# Patient Record
Sex: Male | Born: 1935 | Race: White | Hispanic: No | Marital: Married | Smoking: Never smoker
Health system: Southern US, Community
[De-identification: ages and names within clinical notes are randomized; demographics above are authoritative.]

## PROBLEM LIST (undated history)

## (undated) DIAGNOSIS — I87009 Postthrombotic syndrome without complications of unspecified extremity: Secondary | ICD-10-CM

## (undated) DIAGNOSIS — K219 Gastro-esophageal reflux disease without esophagitis: Secondary | ICD-10-CM

## (undated) DIAGNOSIS — E119 Type 2 diabetes mellitus without complications: Secondary | ICD-10-CM

## (undated) DIAGNOSIS — M1990 Unspecified osteoarthritis, unspecified site: Secondary | ICD-10-CM

## (undated) DIAGNOSIS — R251 Tremor, unspecified: Secondary | ICD-10-CM

## (undated) DIAGNOSIS — D45 Polycythemia vera: Secondary | ICD-10-CM

## (undated) DIAGNOSIS — I10 Essential (primary) hypertension: Secondary | ICD-10-CM

## (undated) DIAGNOSIS — I2510 Atherosclerotic heart disease of native coronary artery without angina pectoris: Secondary | ICD-10-CM

## (undated) DIAGNOSIS — J449 Chronic obstructive pulmonary disease, unspecified: Secondary | ICD-10-CM

## (undated) DIAGNOSIS — R413 Other amnesia: Secondary | ICD-10-CM

## (undated) DIAGNOSIS — G4733 Obstructive sleep apnea (adult) (pediatric): Secondary | ICD-10-CM

## (undated) DIAGNOSIS — R269 Unspecified abnormalities of gait and mobility: Secondary | ICD-10-CM

## (undated) DIAGNOSIS — I82409 Acute embolism and thrombosis of unspecified deep veins of unspecified lower extremity: Secondary | ICD-10-CM

## (undated) HISTORY — DX: Postthrombotic syndrome without complications of unspecified extremity: I87.009

## (undated) HISTORY — PX: TONSILLECTOMY: SUR1361

## (undated) HISTORY — DX: Polycythemia vera: D45

## (undated) HISTORY — PX: OTHER SURGICAL HISTORY: SHX169

## (undated) HISTORY — DX: Other amnesia: R41.3

## (undated) HISTORY — PX: BACK SURGERY: SHX140

## (undated) HISTORY — PX: CORONARY ARTERY BYPASS GRAFT: SHX141

## (undated) HISTORY — DX: Unspecified abnormalities of gait and mobility: R26.9

## (undated) HISTORY — DX: Type 2 diabetes mellitus without complications: E11.9

## (undated) HISTORY — DX: Acute embolism and thrombosis of unspecified deep veins of unspecified lower extremity: I82.409

## (undated) HISTORY — DX: Tremor, unspecified: R25.1

## (undated) HISTORY — DX: Gastro-esophageal reflux disease without esophagitis: K21.9

## (undated) HISTORY — DX: Obstructive sleep apnea (adult) (pediatric): G47.33

---

## 1998-01-17 ENCOUNTER — Encounter: Admission: RE | Admit: 1998-01-17 | Discharge: 1998-04-17 | Payer: Self-pay | Admitting: Internal Medicine

## 1998-02-14 ENCOUNTER — Encounter: Payer: Self-pay | Admitting: Hematology & Oncology

## 1998-02-14 ENCOUNTER — Ambulatory Visit (HOSPITAL_COMMUNITY): Admission: RE | Admit: 1998-02-14 | Discharge: 1998-02-14 | Payer: Self-pay | Admitting: Hematology & Oncology

## 1999-10-25 ENCOUNTER — Ambulatory Visit (HOSPITAL_COMMUNITY): Admission: RE | Admit: 1999-10-25 | Discharge: 1999-10-25 | Payer: Self-pay | Admitting: Ophthalmology

## 2002-06-15 ENCOUNTER — Encounter: Payer: Self-pay | Admitting: Ophthalmology

## 2002-06-17 ENCOUNTER — Ambulatory Visit (HOSPITAL_COMMUNITY): Admission: RE | Admit: 2002-06-17 | Discharge: 2002-06-17 | Payer: Self-pay | Admitting: Ophthalmology

## 2002-12-15 ENCOUNTER — Encounter: Payer: Self-pay | Admitting: Specialist

## 2002-12-15 ENCOUNTER — Encounter: Payer: Self-pay | Admitting: Emergency Medicine

## 2002-12-15 ENCOUNTER — Inpatient Hospital Stay (HOSPITAL_COMMUNITY): Admission: EM | Admit: 2002-12-15 | Discharge: 2002-12-21 | Payer: Self-pay | Admitting: Emergency Medicine

## 2003-12-20 ENCOUNTER — Encounter (HOSPITAL_BASED_OUTPATIENT_CLINIC_OR_DEPARTMENT_OTHER): Admission: RE | Admit: 2003-12-20 | Discharge: 2003-12-23 | Payer: Self-pay | Admitting: Internal Medicine

## 2004-02-08 ENCOUNTER — Ambulatory Visit: Payer: Self-pay | Admitting: Hematology & Oncology

## 2004-02-24 ENCOUNTER — Ambulatory Visit: Payer: Self-pay | Admitting: Critical Care Medicine

## 2004-02-26 ENCOUNTER — Ambulatory Visit (HOSPITAL_BASED_OUTPATIENT_CLINIC_OR_DEPARTMENT_OTHER): Admission: RE | Admit: 2004-02-26 | Discharge: 2004-02-26 | Payer: Self-pay | Admitting: Critical Care Medicine

## 2004-02-26 ENCOUNTER — Ambulatory Visit: Payer: Self-pay | Admitting: Pulmonary Disease

## 2004-03-13 ENCOUNTER — Ambulatory Visit: Payer: Self-pay | Admitting: Critical Care Medicine

## 2004-03-19 ENCOUNTER — Ambulatory Visit: Payer: Self-pay | Admitting: Adult Health

## 2004-03-21 ENCOUNTER — Encounter (HOSPITAL_BASED_OUTPATIENT_CLINIC_OR_DEPARTMENT_OTHER): Admission: RE | Admit: 2004-03-21 | Discharge: 2004-04-06 | Payer: Self-pay | Admitting: Internal Medicine

## 2004-04-06 ENCOUNTER — Ambulatory Visit: Payer: Self-pay | Admitting: Critical Care Medicine

## 2004-04-11 ENCOUNTER — Ambulatory Visit: Payer: Self-pay | Admitting: Hematology & Oncology

## 2004-05-10 ENCOUNTER — Ambulatory Visit: Payer: Self-pay | Admitting: Critical Care Medicine

## 2004-06-05 ENCOUNTER — Ambulatory Visit: Payer: Self-pay | Admitting: Hematology & Oncology

## 2004-08-07 ENCOUNTER — Ambulatory Visit: Payer: Self-pay | Admitting: Hematology & Oncology

## 2004-09-27 ENCOUNTER — Ambulatory Visit: Payer: Self-pay | Admitting: Critical Care Medicine

## 2004-10-08 ENCOUNTER — Ambulatory Visit: Payer: Self-pay | Admitting: Hematology & Oncology

## 2004-11-06 ENCOUNTER — Ambulatory Visit: Payer: Self-pay | Admitting: Internal Medicine

## 2004-12-04 ENCOUNTER — Ambulatory Visit: Payer: Self-pay | Admitting: Hematology & Oncology

## 2004-12-13 ENCOUNTER — Ambulatory Visit: Payer: Self-pay | Admitting: Critical Care Medicine

## 2005-01-07 ENCOUNTER — Encounter: Admission: RE | Admit: 2005-01-07 | Discharge: 2005-01-07 | Payer: Self-pay | Admitting: Internal Medicine

## 2005-01-21 ENCOUNTER — Ambulatory Visit: Payer: Self-pay | Admitting: Hematology & Oncology

## 2005-03-13 ENCOUNTER — Ambulatory Visit: Payer: Self-pay | Admitting: Critical Care Medicine

## 2005-03-26 ENCOUNTER — Ambulatory Visit: Payer: Self-pay | Admitting: Hematology & Oncology

## 2005-05-28 ENCOUNTER — Ambulatory Visit: Payer: Self-pay | Admitting: Hematology & Oncology

## 2005-06-24 ENCOUNTER — Ambulatory Visit: Payer: Self-pay | Admitting: Critical Care Medicine

## 2005-07-23 ENCOUNTER — Ambulatory Visit: Payer: Self-pay | Admitting: Hematology & Oncology

## 2005-07-24 LAB — CBC WITH DIFFERENTIAL/PLATELET
BASO%: 0.6 % (ref 0.0–2.0)
Basophils Absolute: 0 10*3/uL (ref 0.0–0.1)
Eosinophils Absolute: 0.1 10*3/uL (ref 0.0–0.5)
HCT: 46.3 % (ref 38.7–49.9)
HGB: 15.9 g/dL (ref 13.0–17.1)
MCHC: 34.4 g/dL (ref 32.0–35.9)
MONO#: 0.6 10*3/uL (ref 0.1–0.9)
NEUT#: 5.3 10*3/uL (ref 1.5–6.5)
NEUT%: 71.1 % (ref 40.0–75.0)
WBC: 7.4 10*3/uL (ref 4.0–10.0)
lymph#: 1.3 10*3/uL (ref 0.9–3.3)

## 2005-08-08 ENCOUNTER — Ambulatory Visit: Payer: Self-pay | Admitting: Pulmonary Disease

## 2005-08-22 LAB — CBC WITH DIFFERENTIAL/PLATELET
BASO%: 0.1 % (ref 0.0–2.0)
LYMPH%: 19.9 % (ref 14.0–48.0)
MCHC: 34.2 g/dL (ref 32.0–35.9)
MONO#: 0.5 10*3/uL (ref 0.1–0.9)
RBC: 4.96 10*6/uL (ref 4.20–5.71)
WBC: 6.4 10*3/uL (ref 4.0–10.0)
lymph#: 1.3 10*3/uL (ref 0.9–3.3)

## 2005-08-22 LAB — CHCC SMEAR

## 2005-10-30 ENCOUNTER — Ambulatory Visit: Payer: Self-pay | Admitting: Critical Care Medicine

## 2005-11-19 ENCOUNTER — Ambulatory Visit: Payer: Self-pay | Admitting: Hematology & Oncology

## 2005-11-21 LAB — CBC WITH DIFFERENTIAL/PLATELET
Basophils Absolute: 0.1 10*3/uL (ref 0.0–0.1)
EOS%: 1.9 % (ref 0.0–7.0)
HGB: 15.8 g/dL (ref 13.0–17.1)
MCH: 29.7 pg (ref 28.0–33.4)
MCV: 87.5 fL (ref 81.6–98.0)
MONO%: 9.2 % (ref 0.0–13.0)
NEUT%: 66.6 % (ref 40.0–75.0)
RDW: 14.4 % (ref 11.2–14.6)

## 2005-11-28 LAB — CBC WITH DIFFERENTIAL/PLATELET
BASO%: 1.1 % (ref 0.0–2.0)
LYMPH%: 14.6 % (ref 14.0–48.0)
MCH: 29.9 pg (ref 28.0–33.4)
MCHC: 34.2 g/dL (ref 32.0–35.9)
MCV: 87.5 fL (ref 81.6–98.0)
MONO%: 7.5 % (ref 0.0–13.0)
Platelets: 123 10*3/uL — ABNORMAL LOW (ref 145–400)
RBC: 4.88 10*6/uL (ref 4.20–5.71)

## 2006-02-18 ENCOUNTER — Ambulatory Visit: Payer: Self-pay | Admitting: Hematology & Oncology

## 2006-02-20 LAB — CBC WITH DIFFERENTIAL/PLATELET
Basophils Absolute: 0 10*3/uL (ref 0.0–0.1)
EOS%: 2 % (ref 0.0–7.0)
LYMPH%: 19.4 % (ref 14.0–48.0)
MCH: 29.5 pg (ref 28.0–33.4)
MCV: 87.6 fL (ref 81.6–98.0)
MONO%: 7.3 % (ref 0.0–13.0)
Platelets: 114 10*3/uL — ABNORMAL LOW (ref 145–400)
RBC: 5.03 10*6/uL (ref 4.20–5.71)
RDW: 14.2 % (ref 11.2–14.6)

## 2006-03-05 ENCOUNTER — Ambulatory Visit: Payer: Self-pay | Admitting: Critical Care Medicine

## 2006-04-15 ENCOUNTER — Ambulatory Visit: Payer: Self-pay | Admitting: Hematology & Oncology

## 2006-04-17 LAB — CBC WITH DIFFERENTIAL/PLATELET
Basophils Absolute: 0 10*3/uL (ref 0.0–0.1)
EOS%: 1.9 % (ref 0.0–7.0)
HCT: 44 % (ref 38.7–49.9)
HGB: 14.7 g/dL (ref 13.0–17.1)
MCH: 28.8 pg (ref 28.0–33.4)
NEUT%: 69.7 % (ref 40.0–75.0)
lymph#: 1.2 10*3/uL (ref 0.9–3.3)

## 2006-05-29 ENCOUNTER — Encounter: Admission: RE | Admit: 2006-05-29 | Discharge: 2006-07-31 | Payer: Self-pay | Admitting: Specialist

## 2006-06-16 ENCOUNTER — Ambulatory Visit: Payer: Self-pay | Admitting: Hematology & Oncology

## 2006-06-19 LAB — CBC WITH DIFFERENTIAL/PLATELET
Basophils Absolute: 0 10*3/uL (ref 0.0–0.1)
EOS%: 0.8 % (ref 0.0–7.0)
HCT: 43.8 % (ref 38.7–49.9)
HGB: 15.4 g/dL (ref 13.0–17.1)
LYMPH%: 14.6 % (ref 14.0–48.0)
MCH: 30.6 pg (ref 28.0–33.4)
MCV: 87.2 fL (ref 81.6–98.0)
MONO%: 6.4 % (ref 0.0–13.0)
NEUT%: 77.7 % — ABNORMAL HIGH (ref 40.0–75.0)
RDW: 14.6 % (ref 11.2–14.6)

## 2006-08-12 ENCOUNTER — Ambulatory Visit: Payer: Self-pay | Admitting: Hematology & Oncology

## 2006-08-14 LAB — FERRITIN: Ferritin: 41 ng/mL (ref 22–322)

## 2006-08-14 LAB — CBC WITH DIFFERENTIAL/PLATELET
EOS%: 2.2 % (ref 0.0–7.0)
MCH: 30.7 pg (ref 28.0–33.4)
MCHC: 35.3 g/dL (ref 32.0–35.9)
MCV: 86.9 fL (ref 81.6–98.0)
MONO%: 6.8 % (ref 0.0–13.0)
RBC: 5.17 10*6/uL (ref 4.20–5.71)
RDW: 14.1 % (ref 11.2–14.6)

## 2006-09-25 LAB — CBC WITH DIFFERENTIAL/PLATELET
Basophils Absolute: 0.1 10*3/uL (ref 0.0–0.1)
Eosinophils Absolute: 0.1 10*3/uL (ref 0.0–0.5)
HGB: 14.8 g/dL (ref 13.0–17.1)
MONO#: 0.6 10*3/uL (ref 0.1–0.9)
NEUT#: 4.3 10*3/uL (ref 1.5–6.5)
RDW: 13.8 % (ref 11.2–14.6)
lymph#: 1.2 10*3/uL (ref 0.9–3.3)

## 2006-11-10 ENCOUNTER — Ambulatory Visit: Payer: Self-pay | Admitting: Hematology & Oncology

## 2006-11-13 LAB — CBC WITH DIFFERENTIAL/PLATELET
Basophils Absolute: 0.1 10*3/uL (ref 0.0–0.1)
Eosinophils Absolute: 0.1 10*3/uL (ref 0.0–0.5)
HGB: 15.5 g/dL (ref 13.0–17.1)
LYMPH%: 18.7 % (ref 14.0–48.0)
MCV: 87.1 fL (ref 81.6–98.0)
MONO%: 5.6 % (ref 0.0–13.0)
NEUT#: 4.9 10*3/uL (ref 1.5–6.5)
Platelets: 122 10*3/uL — ABNORMAL LOW (ref 145–400)

## 2006-11-27 ENCOUNTER — Ambulatory Visit: Payer: Self-pay | Admitting: Critical Care Medicine

## 2007-01-12 ENCOUNTER — Ambulatory Visit: Payer: Self-pay | Admitting: Hematology & Oncology

## 2007-01-15 LAB — CBC WITH DIFFERENTIAL/PLATELET
BASO%: 0.3 % (ref 0.0–2.0)
Basophils Absolute: 0 10*3/uL (ref 0.0–0.1)
HCT: 43.7 % (ref 38.7–49.9)
HGB: 15.5 g/dL (ref 13.0–17.1)
LYMPH%: 19.2 % (ref 14.0–48.0)
MCH: 30.7 pg (ref 28.0–33.4)
MCHC: 35.5 g/dL (ref 32.0–35.9)
MONO#: 0.4 10*3/uL (ref 0.1–0.9)
NEUT%: 70.9 % (ref 40.0–75.0)
Platelets: 107 10*3/uL — ABNORMAL LOW (ref 145–400)
WBC: 5.7 10*3/uL (ref 4.0–10.0)

## 2007-03-17 ENCOUNTER — Ambulatory Visit: Payer: Self-pay | Admitting: Hematology & Oncology

## 2007-03-19 LAB — CBC WITH DIFFERENTIAL/PLATELET
BASO%: 0.2 % (ref 0.0–2.0)
Basophils Absolute: 0 10*3/uL (ref 0.0–0.1)
EOS%: 2 % (ref 0.0–7.0)
HCT: 43.7 % (ref 38.7–49.9)
LYMPH%: 22.2 % (ref 14.0–48.0)
MCH: 30.7 pg (ref 28.0–33.4)
MCHC: 34.8 g/dL (ref 32.0–35.9)
MCV: 88.2 fL (ref 81.6–98.0)
MONO%: 8.6 % (ref 0.0–13.0)
NEUT%: 67 % (ref 40.0–75.0)
lymph#: 1.4 10*3/uL (ref 0.9–3.3)

## 2007-05-18 ENCOUNTER — Ambulatory Visit: Payer: Self-pay | Admitting: Hematology & Oncology

## 2007-05-21 LAB — CBC WITH DIFFERENTIAL/PLATELET
Basophils Absolute: 0.1 10*3/uL (ref 0.0–0.1)
EOS%: 1.7 % (ref 0.0–7.0)
Eosinophils Absolute: 0.1 10*3/uL (ref 0.0–0.5)
HCT: 47.7 % (ref 38.7–49.9)
HGB: 15.3 g/dL (ref 13.0–17.1)
MCH: 28.3 pg (ref 28.0–33.4)
MCV: 87.9 fL (ref 81.6–98.0)
MONO%: 9 % (ref 0.0–13.0)
NEUT%: 72.4 % (ref 40.0–75.0)
Platelets: 122 10*3/uL — ABNORMAL LOW (ref 145–400)

## 2007-05-27 ENCOUNTER — Ambulatory Visit: Payer: Self-pay | Admitting: Internal Medicine

## 2007-05-27 ENCOUNTER — Telehealth (INDEPENDENT_AMBULATORY_CARE_PROVIDER_SITE_OTHER): Payer: Self-pay | Admitting: *Deleted

## 2007-05-27 DIAGNOSIS — G4733 Obstructive sleep apnea (adult) (pediatric): Secondary | ICD-10-CM | POA: Insufficient documentation

## 2007-05-27 DIAGNOSIS — J45909 Unspecified asthma, uncomplicated: Secondary | ICD-10-CM | POA: Insufficient documentation

## 2007-05-27 DIAGNOSIS — K219 Gastro-esophageal reflux disease without esophagitis: Secondary | ICD-10-CM | POA: Insufficient documentation

## 2007-05-27 DIAGNOSIS — E119 Type 2 diabetes mellitus without complications: Secondary | ICD-10-CM | POA: Insufficient documentation

## 2007-07-14 ENCOUNTER — Ambulatory Visit: Payer: Self-pay | Admitting: Hematology & Oncology

## 2007-07-16 LAB — CBC WITH DIFFERENTIAL/PLATELET
Basophils Absolute: 0 10*3/uL (ref 0.0–0.1)
Eosinophils Absolute: 0.1 10*3/uL (ref 0.0–0.5)
HCT: 42.2 % (ref 38.7–49.9)
HGB: 14.7 g/dL (ref 13.0–17.1)
LYMPH%: 19.1 % (ref 14.0–48.0)
MCHC: 34.9 g/dL (ref 32.0–35.9)
MONO#: 0.4 10*3/uL (ref 0.1–0.9)
NEUT#: 4.1 10*3/uL (ref 1.5–6.5)
NEUT%: 70.8 % (ref 40.0–75.0)
Platelets: 101 10*3/uL — ABNORMAL LOW (ref 145–400)
WBC: 5.8 10*3/uL (ref 4.0–10.0)

## 2007-08-28 ENCOUNTER — Ambulatory Visit: Payer: Self-pay | Admitting: Internal Medicine

## 2007-09-08 ENCOUNTER — Ambulatory Visit: Payer: Self-pay | Admitting: Hematology & Oncology

## 2007-09-10 LAB — CBC WITH DIFFERENTIAL/PLATELET
Eosinophils Absolute: 0 10*3/uL (ref 0.0–0.5)
HCT: 44.7 % (ref 38.7–49.9)
LYMPH%: 7.7 % — ABNORMAL LOW (ref 14.0–48.0)
MONO#: 0.3 10*3/uL (ref 0.1–0.9)
NEUT#: 6.6 10*3/uL — ABNORMAL HIGH (ref 1.5–6.5)
Platelets: 110 10*3/uL — ABNORMAL LOW (ref 145–400)
RBC: 5.09 10*6/uL (ref 4.20–5.71)
WBC: 7.4 10*3/uL (ref 4.0–10.0)

## 2007-09-11 ENCOUNTER — Ambulatory Visit: Payer: Self-pay | Admitting: Critical Care Medicine

## 2007-10-08 ENCOUNTER — Ambulatory Visit: Payer: Self-pay | Admitting: Critical Care Medicine

## 2007-11-10 ENCOUNTER — Ambulatory Visit: Payer: Self-pay | Admitting: Hematology & Oncology

## 2007-11-12 LAB — RETICULOCYTES (CHCC): Retic Ct Pct: 0.8 % (ref 0.4–3.1)

## 2007-11-12 LAB — CBC WITH DIFFERENTIAL (CANCER CENTER ONLY)
BASO%: 0.9 % (ref 0.0–2.0)
EOS%: 2.3 % (ref 0.0–7.0)
HCT: 47.2 % (ref 38.7–49.9)
LYMPH#: 1.3 10*3/uL (ref 0.9–3.3)
MCHC: 34.2 g/dL (ref 32.0–35.9)
NEUT#: 3.9 10*3/uL (ref 1.5–6.5)
NEUT%: 67.7 % (ref 40.0–80.0)
RDW: 12.7 % (ref 10.5–14.6)

## 2008-01-06 ENCOUNTER — Ambulatory Visit: Payer: Self-pay | Admitting: Hematology & Oncology

## 2008-01-07 LAB — CBC WITH DIFFERENTIAL (CANCER CENTER ONLY)
BASO%: 0.7 % (ref 0.0–2.0)
EOS%: 2.7 % (ref 0.0–7.0)
HGB: 14.6 g/dL (ref 13.0–17.1)
LYMPH%: 18.2 % (ref 14.0–48.0)
MCH: 29.4 pg (ref 28.0–33.4)
NEUT%: 72.3 % (ref 40.0–80.0)
RBC: 4.96 10*6/uL (ref 4.20–5.70)
WBC: 6.4 10*3/uL (ref 4.0–10.0)

## 2008-01-19 ENCOUNTER — Ambulatory Visit: Payer: Self-pay | Admitting: Critical Care Medicine

## 2008-03-09 ENCOUNTER — Ambulatory Visit: Payer: Self-pay | Admitting: Hematology & Oncology

## 2008-03-10 LAB — CBC WITH DIFFERENTIAL (CANCER CENTER ONLY)
BASO#: 0 10*3/uL (ref 0.0–0.2)
BASO%: 0.4 % (ref 0.0–2.0)
EOS%: 2.7 % (ref 0.0–7.0)
HGB: 15.5 g/dL (ref 13.0–17.1)
LYMPH#: 1.4 10*3/uL (ref 0.9–3.3)
MCH: 30 pg (ref 28.0–33.4)
MCHC: 34.5 g/dL (ref 32.0–35.9)
MONO%: 7.1 % (ref 0.0–13.0)
NEUT#: 3 10*3/uL (ref 1.5–6.5)
Platelets: 93 10*3/uL — ABNORMAL LOW (ref 145–400)

## 2008-05-10 ENCOUNTER — Ambulatory Visit: Payer: Self-pay | Admitting: Hematology & Oncology

## 2008-05-11 LAB — CBC WITH DIFFERENTIAL (CANCER CENTER ONLY)
Eosinophils Absolute: 0.1 10*3/uL (ref 0.0–0.5)
HCT: 42.6 % (ref 38.7–49.9)
LYMPH%: 25.3 % (ref 14.0–48.0)
MCH: 29.2 pg (ref 28.0–33.4)
MCV: 86 fL (ref 82–98)
MONO#: 0.2 10*3/uL (ref 0.1–0.9)
MONO%: 5 % (ref 0.0–13.0)
NEUT%: 66.9 % (ref 40.0–80.0)
RBC: 4.92 10*6/uL (ref 4.20–5.70)
WBC: 4.7 10*3/uL (ref 4.0–10.0)

## 2008-07-06 ENCOUNTER — Ambulatory Visit: Payer: Self-pay | Admitting: Hematology & Oncology

## 2008-07-07 LAB — CBC WITH DIFFERENTIAL (CANCER CENTER ONLY)
BASO#: 0 10*3/uL (ref 0.0–0.2)
Eosinophils Absolute: 0.1 10*3/uL (ref 0.0–0.5)
HGB: 15.1 g/dL (ref 13.0–17.1)
LYMPH#: 1.2 10*3/uL (ref 0.9–3.3)
MCH: 29 pg (ref 28.0–33.4)
MONO%: 5.7 % (ref 0.0–13.0)
NEUT#: 3.7 10*3/uL (ref 1.5–6.5)
Platelets: 92 10*3/uL — ABNORMAL LOW (ref 145–400)
RBC: 5.22 10*6/uL (ref 4.20–5.70)
WBC: 5.3 10*3/uL (ref 4.0–10.0)

## 2008-07-07 LAB — FERRITIN: Ferritin: 18 ng/mL — ABNORMAL LOW (ref 22–322)

## 2008-07-14 LAB — CBC WITH DIFFERENTIAL (CANCER CENTER ONLY)
BASO#: 0 10*3/uL (ref 0.0–0.2)
Eosinophils Absolute: 0.2 10*3/uL (ref 0.0–0.5)
HGB: 14.8 g/dL (ref 13.0–17.1)
LYMPH%: 23.9 % (ref 14.0–48.0)
MCH: 29.2 pg (ref 28.0–33.4)
MCV: 88 fL (ref 82–98)
MONO#: 0.3 10*3/uL (ref 0.1–0.9)
NEUT#: 3.4 10*3/uL (ref 1.5–6.5)
Platelets: 90 10*3/uL — ABNORMAL LOW (ref 145–400)
RBC: 5.05 10*6/uL (ref 4.20–5.70)
WBC: 5.2 10*3/uL (ref 4.0–10.0)

## 2008-09-08 ENCOUNTER — Ambulatory Visit: Payer: Self-pay | Admitting: Hematology & Oncology

## 2008-09-09 LAB — CBC WITH DIFFERENTIAL (CANCER CENTER ONLY)
BASO#: 0 10*3/uL (ref 0.0–0.2)
BASO%: 0.2 % (ref 0.0–2.0)
EOS%: 3.1 % (ref 0.0–7.0)
HGB: 14.5 g/dL (ref 13.0–17.1)
LYMPH#: 1.2 10*3/uL (ref 0.9–3.3)
MCH: 28.6 pg (ref 28.0–33.4)
MCHC: 33.2 g/dL (ref 32.0–35.9)
MONO%: 7.5 % (ref 0.0–13.0)
NEUT#: 3.4 10*3/uL (ref 1.5–6.5)
Platelets: 98 10*3/uL — ABNORMAL LOW (ref 145–400)
RDW: 12 % (ref 10.5–14.6)

## 2008-11-16 ENCOUNTER — Ambulatory Visit: Payer: Self-pay | Admitting: Hematology & Oncology

## 2008-11-17 LAB — CBC WITH DIFFERENTIAL (CANCER CENTER ONLY)
BASO#: 0 10*3/uL (ref 0.0–0.2)
BASO%: 0.4 % (ref 0.0–2.0)
EOS%: 2.6 % (ref 0.0–7.0)
HCT: 43.8 % (ref 38.7–49.9)
LYMPH#: 1.1 10*3/uL (ref 0.9–3.3)
MCH: 28.5 pg (ref 28.0–33.4)
MCHC: 33.1 g/dL (ref 32.0–35.9)
MONO%: 5.5 % (ref 0.0–13.0)
NEUT%: 71.3 % (ref 40.0–80.0)
RDW: 13.9 % (ref 10.5–14.6)

## 2008-11-17 LAB — FERRITIN: Ferritin: 15 ng/mL — ABNORMAL LOW (ref 22–322)

## 2008-11-24 ENCOUNTER — Ambulatory Visit: Payer: Self-pay | Admitting: Critical Care Medicine

## 2008-12-01 ENCOUNTER — Ambulatory Visit: Payer: Self-pay | Admitting: Critical Care Medicine

## 2008-12-19 ENCOUNTER — Ambulatory Visit: Payer: Self-pay | Admitting: Critical Care Medicine

## 2009-02-01 ENCOUNTER — Ambulatory Visit: Payer: Self-pay | Admitting: Critical Care Medicine

## 2009-02-02 ENCOUNTER — Ambulatory Visit: Payer: Self-pay | Admitting: Vascular Surgery

## 2009-03-09 ENCOUNTER — Ambulatory Visit: Payer: Self-pay | Admitting: Hematology & Oncology

## 2009-03-10 LAB — CBC WITH DIFFERENTIAL (CANCER CENTER ONLY)
BASO%: 0.5 % (ref 0.0–2.0)
EOS%: 3 % (ref 0.0–7.0)
LYMPH#: 1.6 10*3/uL (ref 0.9–3.3)
MCH: 30 pg (ref 28.0–33.4)
MCHC: 34.3 g/dL (ref 32.0–35.9)
MONO%: 4.7 % (ref 0.0–13.0)
NEUT#: 3.2 10*3/uL (ref 1.5–6.5)
Platelets: 99 10*3/uL — ABNORMAL LOW (ref 145–400)
RDW: 13.2 % (ref 10.5–14.6)

## 2009-03-11 LAB — COMPREHENSIVE METABOLIC PANEL
BUN: 21 mg/dL (ref 6–23)
CO2: 26 mEq/L (ref 19–32)
Calcium: 9.2 mg/dL (ref 8.4–10.5)
Chloride: 107 mEq/L (ref 96–112)
Creatinine, Ser: 1.21 mg/dL (ref 0.40–1.50)
Glucose, Bld: 118 mg/dL — ABNORMAL HIGH (ref 70–99)

## 2009-03-11 LAB — FERRITIN: Ferritin: 29 ng/mL (ref 22–322)

## 2009-05-10 ENCOUNTER — Ambulatory Visit: Payer: Self-pay | Admitting: Critical Care Medicine

## 2009-06-12 ENCOUNTER — Ambulatory Visit: Payer: Self-pay | Admitting: Critical Care Medicine

## 2009-07-03 ENCOUNTER — Ambulatory Visit: Payer: Self-pay | Admitting: Critical Care Medicine

## 2009-07-07 ENCOUNTER — Ambulatory Visit: Payer: Self-pay | Admitting: Hematology & Oncology

## 2009-07-07 ENCOUNTER — Encounter: Payer: Self-pay | Admitting: Critical Care Medicine

## 2009-07-10 LAB — COMPREHENSIVE METABOLIC PANEL
ALT: 17 U/L (ref 0–53)
AST: 25 U/L (ref 0–37)
Albumin: 4.1 g/dL (ref 3.5–5.2)
Alkaline Phosphatase: 69 U/L (ref 39–117)
Potassium: 3.7 mEq/L (ref 3.5–5.3)
Sodium: 143 mEq/L (ref 135–145)
Total Bilirubin: 0.7 mg/dL (ref 0.3–1.2)
Total Protein: 6.2 g/dL (ref 6.0–8.3)

## 2009-07-10 LAB — CBC WITH DIFFERENTIAL (CANCER CENTER ONLY)
BASO%: 1 % (ref 0.0–2.0)
EOS%: 3.9 % (ref 0.0–7.0)
Eosinophils Absolute: 0.2 10*3/uL (ref 0.0–0.5)
HCT: 48.7 % (ref 38.7–49.9)
HGB: 16 g/dL (ref 13.0–17.1)
LYMPH%: 28.2 % (ref 14.0–48.0)
MCH: 29 pg (ref 28.0–33.4)
MCV: 88 fL (ref 82–98)
MONO%: 7.2 % (ref 0.0–13.0)
NEUT#: 2.5 10*3/uL (ref 1.5–6.5)
Platelets: 95 10*3/uL — ABNORMAL LOW (ref 145–400)
RDW: 13.5 % (ref 10.5–14.6)
WBC: 4.1 10*3/uL (ref 4.0–10.0)

## 2009-07-10 LAB — VITAMIN D 25 HYDROXY (VIT D DEFICIENCY, FRACTURES): Vit D, 25-Hydroxy: 24 ng/mL — ABNORMAL LOW (ref 30–89)

## 2009-09-29 ENCOUNTER — Ambulatory Visit: Payer: Self-pay | Admitting: Critical Care Medicine

## 2009-11-07 ENCOUNTER — Ambulatory Visit: Payer: Self-pay | Admitting: Hematology & Oncology

## 2009-11-09 LAB — CBC WITH DIFFERENTIAL (CANCER CENTER ONLY)
BASO#: 0 10*3/uL (ref 0.0–0.2)
Eosinophils Absolute: 0.2 10*3/uL (ref 0.0–0.5)
HGB: 16.2 g/dL (ref 13.0–17.1)
LYMPH%: 21.4 % (ref 14.0–48.0)
MCV: 89 fL (ref 82–98)
MONO#: 0.4 10*3/uL (ref 0.1–0.9)
Platelets: 105 10*3/uL — ABNORMAL LOW (ref 145–400)
RBC: 5.43 10*6/uL (ref 4.20–5.70)
WBC: 7.2 10*3/uL (ref 4.0–10.0)

## 2009-11-09 LAB — FERRITIN: Ferritin: 21 ng/mL — ABNORMAL LOW (ref 22–322)

## 2010-02-01 ENCOUNTER — Ambulatory Visit: Payer: Self-pay | Admitting: Critical Care Medicine

## 2010-03-09 ENCOUNTER — Ambulatory Visit: Payer: Self-pay | Admitting: Hematology & Oncology

## 2010-03-12 ENCOUNTER — Emergency Department (HOSPITAL_BASED_OUTPATIENT_CLINIC_OR_DEPARTMENT_OTHER)
Admission: EM | Admit: 2010-03-12 | Discharge: 2010-03-12 | Payer: Self-pay | Source: Home / Self Care | Admitting: Emergency Medicine

## 2010-03-12 LAB — BASIC METABOLIC PANEL
BUN: 27 mg/dL — ABNORMAL HIGH (ref 6–23)
CO2: 29 mEq/L (ref 19–32)
Calcium: 8.9 mg/dL (ref 8.4–10.5)
Glucose, Bld: 189 mg/dL — ABNORMAL HIGH (ref 70–99)
Potassium: 3.9 mEq/L (ref 3.5–5.3)
Sodium: 141 mEq/L (ref 135–145)

## 2010-03-12 LAB — CBC WITH DIFFERENTIAL (CANCER CENTER ONLY)
BASO#: 0.1 10*3/uL (ref 0.0–0.2)
BASO%: 1 % (ref 0.0–2.0)
EOS%: 2.7 % (ref 0.0–7.0)
HCT: 50.6 % — ABNORMAL HIGH (ref 38.7–49.9)
HGB: 16.7 g/dL (ref 13.0–17.1)
LYMPH#: 1.3 10*3/uL (ref 0.9–3.3)
LYMPH%: 17.4 % (ref 14.0–48.0)
MCHC: 33.1 g/dL (ref 32.0–35.9)
MCV: 88 fL (ref 82–98)
NEUT%: 73.1 % (ref 40.0–80.0)
RDW: 12.1 % (ref 10.5–14.6)

## 2010-05-10 NOTE — Assessment & Plan Note (Signed)
Summary: Pulmonary OV   Primary Provider/Referring Provider:  [name]  CC:  3 week follow up.  States breathing is doing well overal.  Denies SOB, wheezing, chest tightness, cough, and and problems with sinus..  History of Present Illness:  Donald Kaiser is a 77 year old white male with a history of moderate intermittent asthma, obstructive sleep apnea.   May 10, 2009 10:31 AM Pt has not been taking symbicort for past two weeks.  The pt has been historically managed with just singulair for controlller therapy until flareups in Fall 2010 to which symbicort was added.   Is doing better,  no flare ups since last here in 10/10. Pt denies any significant sore throat, nasal congestion or excess secretions, fever, chills, sweats, unintended weight loss, pleurtic or exertional chest pain, orthopnea PND, or leg swelling Pt denies any increase in rescue therapy over baseline, denies waking up needing it or having any early am or nocturnal exacerbations of coughing/wheezing/or dyspnea.   June 12, 2009 10:37 AM became ill over the weekedn  sore throat.  then started coughing constantly,  used delsym and tramadol the tramadol helped,  cough is dry, throat is less sore,  no appt, no aches,  no fever, some wheeze,  notes no pndrip,  no chest pain except in Lanterior chest wall.  July 03, 2009 2:54 PM Since last ov is much better.   The pt has finished the prednisone and avelox.  The pt is still on nasonex and asmanex. There are no new issues. Pt denies any significant sore throat, nasal congestion or excess secretions, fever, chills, sweats, unintended weight loss, pleurtic or exertional chest pain, orthopnea PND, or leg swelling Pt denies any increase in rescue therapy over baseline, denies waking up needing it or having any early am or nocturnal exacerbations of coughing/wheezing/or dyspnea.   Asthma History    Asthma Control Assessment:    Age range: 12+ years    Symptoms: 0-2 days/week  Nighttime Awakenings: 0-2/month    Interferes w/ normal activity: no limitations    SABA use (not for EIB): 0-2 days/week    ATAQ questionnaire: 0    Exacerbations requiring oral systemic steroids: 0-1/year    Asthma Control Assessment: Well Controlled   Preventive Screening-Counseling & Management  Alcohol-Tobacco     Smoking Status: never  Current Medications (verified): 1)  Furosemide 40 Mg Tabs (Furosemide) .... 1/2 Tab Once Daily 2)  Zocor 40 Mg  Tabs (Simvastatin) .... At Bedtime 3)  Flomax 0.4 Mg Caps (Tamsulosin Hcl) .... Once Daily 4)  Adult Aspirin Low Strength 81 Mg  Tbdp (Aspirin) .... Once Daily 5)  Omeprazole 20 Mg Cpdr (Omeprazole) .[name].. 1 By Mouth Two Times A Day 6)  Cpap .... At Bedtime 7)  Byetta 10 Mcg Pen 10 Mcg/0.73ml  Soln (Exenatide) .... Two Times A Day 8)  Cozaar 50 Mg  Tabs (Losartan Potassium) .... 1/2 Tab Once Daily 9)  Travatan 0.004 %  Soln (Travoprost) .[name].. 1 Gtt Ou 10)  Metoprolol Tartrate 50 Mg  Tabs (Metoprolol Tartrate) .... 1/2 Once Daily 11)  Preservision   Tabs (Multiple Vitamins-Minerals) .... Two Times A Day 12)  Novolin 70/30 70-30 %  Susp (Insulin Isophane & Regular) .... As Directed 13)  Nitroglycerin 0.4 Mg/hr  Pt24 (Nitroglycerin) .... As Needed 14)  Combivent 103-18 Mcg/act Aero (Ipratropium-Albuterol) .... One To Two Puff Every 4hrs As Needed 15)  Tramadol Hcl 50 Mg Tabs (Tramadol Hcl) .[name].. 1 Every 4 Hr As Needed 16)  Delsym 30 Mg/78ml Lqcr (Dextromethorphan Polistirex) .... 2 Tsp Every 12 Hr As Needed 17)  Avastin 100 Mg/75ml Soln (Bevacizumab) .... Injection in Eye Left  Every 8 Weeks 18)  Singulair 10 Mg  Tabs (Montelukast Sodium) .... One By Mouth Daily 19)  Asmanex 120 Metered Doses 220 Mcg/inh  Aepb (Mometasone Furoate) .... Two  Puff  Nightly 20)  Vitamin D (Ergocalciferol) 50000 Unit Caps (Ergocalciferol) .... Once Weekly 21)  Benzonatate 100 Mg Caps (Benzonatate) .[name].. 1-2 Every 6-8 Hours As Needed 22)  Nasonex 50 Mcg/act  Susp  (Mometasone Furoate) .... Two Puffs Each Nostril Daily  Allergies (verified): No Known Drug Allergies  Past History:  Past medical, surgical, family and social histories (including risk factors) reviewed, and no changes noted (except as noted below).  Past Medical History: Reviewed history from 05/10/2009 and no changes required. 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 tonsilectomy artrial by pass x 5 back surgery broken leg surgery  Family History: Reviewed history from 08/28/2007 and no changes required.   Negative for atopy or respiratory diseases family history of emphysema, allergies and asthma in sister 2 brothers have heart disease both sister and mother have rheumatism and cancer Mother deceased at age 54 due to pneumonia father deceased at age 52 due to kidney failure sister deceased a age 54 due to stroke and respiratory problems brother deceased at age 68 due to MVA  Social History: Reviewed history from 11/24/2008 and no changes required. 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    Vital Signs:  Patient profile:   77 year old male Height:      67.5 inches Weight:      237.50 pounds BMI:     36.78 O2 Sat:      96 % on Room air Temp:     97.7 degrees F oral Pulse rate:   67 / minute BP sitting:   116 / 74  (left arm) Cuff size:   large  Vitals Entered By: Gweneth Dimitri RN (July 03, 2009 2:42 PM)  O2 Flow:  Room air CC: 3 week follow up.  States breathing is doing well overal.  Denies SOB, wheezing, chest tightness, cough, and problems with sinus. Comments Medications reviewed with patient Daytime contact number verified with patient. Gweneth Dimitri RN  July 03, 2009 2:42 PM    Physical Exam  Additional Exam:   GENERAL:  A/Ox3; pleasant & cooperative.NAD HEENT:  [place]/AT, EOM-wnl, PERRLA, EACs-clear, TMs-wnl, NOSE-purulent L>R nares, THROAT-clear & wnl. NECK:  Supple w/ fair ROM; no JVD; normal carotid impulses w/o bruits; no thyromegaly or nodules palpated;  no lymphadenopathy. CHEST: clear, exp wheezes, poor airflow HEART:  RRR, no m/r/g  heard ABDOMEN:  Soft & nt; nml bowel sounds; no organomegaly or masses detected. EXT: Warm bilat,  no calf pain, edema, clubbing, pulses intact Skin: no rash/lesion    Impression & Recommendations:  Problem # 1:  ASTHMA (ICD-493.90) Assessment Improved Asthma improved with resolved bronchitis plan No change in inhaled medications.   Maintain treatment program as currently prescribed.  Complete Medication List: 1)  Furosemide 40 Mg Tabs (Furosemide) .... 1/2 tab once daily 2)  Zocor 40 Mg Tabs (Simvastatin) .... At bedtime 3)  Flomax 0.4 Mg Caps (Tamsulosin hcl) .... Once daily 4)  Adult Aspirin Low Strength 81 Mg Tbdp (Aspirin) .... Once daily 5)  Omeprazole 20 Mg Cpdr (Omeprazole) .[name].. 1 by mouth two times a day 6)  Cpap  ....  At bedtime 7)  Byetta 10 Mcg Pen 10 Mcg/0.54ml Soln (Exenatide) .... Two times a day 8)  Cozaar 50 Mg Tabs (Losartan potassium) .... 1/2 tab once daily 9)  Travatan 0.004 % Soln (Travoprost) .[name].. 1 gtt ou 10)  Metoprolol Tartrate 50 Mg Tabs (Metoprolol tartrate) .... 1/2 once daily 11)  Preservision Tabs (multiple Vitamins-minerals)  .... Two times a day 12)  Novolin 70/30 70-30 % Susp (Insulin isophane & regular) .... As directed 13)  Nitroglycerin 0.4 Mg/hr Pt24 (Nitroglycerin) .... As needed 14)  Combivent 103-18 Mcg/act Aero (Ipratropium-albuterol) .... One to two puff every 4hrs as needed 15)  Tramadol Hcl 50 Mg Tabs (Tramadol hcl) .[name].. 1 every 4 hr as needed 16)  Delsym 30 Mg/15ml Lqcr (Dextromethorphan polistirex) .... 2 tsp every 12 hr as needed 17)  Avastin 100 Mg/23ml Soln (Bevacizumab) .... Injection in eye left  every 8 weeks 18)  Singulair 10 Mg Tabs (Montelukast sodium) .... One by mouth daily 19)  Asmanex 120 Metered Doses 220 Mcg/inh Aepb (Mometasone furoate) .... Two  puff  nightly 20)  Vitamin D (ergocalciferol) 50000 Unit Caps (Ergocalciferol) .... Once weekly 21)  Benzonatate 100 Mg Caps (Benzonatate) .[name].. 1-2 every 6-8 hours as needed 22)  Nasonex 50 Mcg/act Susp (Mometasone furoate) .... Two puffs each nostril daily  Patient Instructions: 1)  No change in medications 2)  Stay on nasonex and asmanex for now 3)  You can stop nasonex when summer is in full swing and flowers stop growing 4)  Return 3 months  Appended Document: Pulmonary OV fax [facility]

## 2010-05-10 NOTE — Assessment & Plan Note (Signed)
Summary: Pulmonary OV   Primary Provider/Referring Provider:  [name]  CC:  Acute Visit.  c/o sore throat, wheezing, chest tightness, and prod cough with a small amount of white mucus since yesterday.  Denies increased SOB.  Requesting rx for tramadol.[name]  History of Present Illness:  Donald Kaiser is a 77 year old white male with a history of moderate intermittent asthma, obstructive sleep apnea.   May 10, 2009 10:31 AM Pt has not been taking symbicort for past two weeks.  The pt has been historically managed with just singulair for controlller therapy until flareups in Fall 2010 to which symbicort was added.   Is doing better,  no flare ups since last here in 10/10. Pt denies any significant sore throat, nasal congestion or excess secretions, fever, chills, sweats, unintended weight loss, pleurtic or exertional chest pain, orthopnea PND, or leg swelling Pt denies any increase in rescue therapy over baseline, denies waking up needing it or having any early am or nocturnal exacerbations of coughing/wheezing/or dyspnea.   June 12, 2009 10:37 AM became ill over the weekedn  sore throat.  then started coughing constantly,  used delsym and tramadol the tramadol helped,  cough is dry, throat is less sore,  no appt, no aches,  no fever, some wheeze,  notes no pndrip,  no chest pain except in Lanterior chest wall.  Asthma History    Asthma Control Assessment:    Age range: 12+ years    Symptoms: >2 days/week    Nighttime Awakenings: 1-3/week    Interferes w/ normal activity: some limitations    SABA use (not for EIB): 0-2 days/week    ATAQ questionnaire: 1-2    Exacerbations requiring oral systemic steroids: 0-1/year    Asthma Control Assessment: Not Well Controlled   Preventive Screening-Counseling & Management  Alcohol-Tobacco     Smoking Status: never  Current Medications (verified): 1)  Furosemide 40 Mg Tabs (Furosemide) .... 1/2 Tab Once Daily 2)  Zocor 40 Mg  Tabs  (Simvastatin) .... At Bedtime 3)  Flomax 0.4 Mg Caps (Tamsulosin Hcl) .... Once Daily 4)  Adult Aspirin Low Strength 81 Mg  Tbdp (Aspirin) .... Once Daily 5)  Omeprazole 20 Mg Cpdr (Omeprazole) .[name].. 1 By Mouth Two Times A Day 6)  Cpap .... At Bedtime 7)  Byetta 10 Mcg Pen 10 Mcg/0.82ml  Soln (Exenatide) .... Two Times A Day 8)  Cozaar 50 Mg  Tabs (Losartan Potassium) .... 1/2 Tab Once Daily 9)  Travatan 0.004 %  Soln (Travoprost) .[name].. 1 Gtt Ou 10)  Metoprolol Tartrate 50 Mg  Tabs (Metoprolol Tartrate) .... 1/2 Once Daily 11)  Preservision   Tabs (Multiple Vitamins-Minerals) .... Two Times A Day 12)  Novolin 70/30 70-30 %  Susp (Insulin Isophane & Regular) .... As Directed 13)  Nitroglycerin 0.4 Mg/hr  Pt24 (Nitroglycerin) .... As Needed 14)  Combivent 103-18 Mcg/act Aero (Ipratropium-Albuterol) .... One To Two Puff Every 4hrs As Needed 15)  Tramadol Hcl 50 Mg Tabs (Tramadol Hcl) .[name].. 1 Every 4 Hr As Needed 16)  Delsym 30 Mg/75ml Lqcr (Dextromethorphan Polistirex) .... 2 Tsp Every 12 Hr As Needed 17)  Avastin 100 Mg/84ml Soln (Bevacizumab) .... Injection in Eye Left  Every 8 Weeks 18)  Singulair 10 Mg  Tabs (Montelukast Sodium) .... One By Mouth Daily 19)  Asmanex 120 Metered Doses 220 Mcg/inh  Aepb (Mometasone Furoate) .... One  Puff  Nightly 20)  Vitamin D (Ergocalciferol) 50000 Unit Caps (Ergocalciferol) .... Once Weekly 21)  Benzonatate 100  Mg Caps (Benzonatate) .[name].. 1-2 Every 6-8 Hours As Needed 22)  Preservison .... Two Times A Day  Allergies (verified): No Known Drug Allergies  Past History:  Past medical, surgical, family and social histories (including risk factors) reviewed, and no changes noted (except as noted below).  Past Medical History: Reviewed history from 05/10/2009 and no changes required. 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 tonsilectomy artrial by pass x 5 back surgery broken leg surgery  Family History: Reviewed history from 08/28/2007 and no changes required.   Negative for atopy or respiratory diseases family history of emphysema, allergies and asthma in sister 2 brothers have heart disease both sister and mother have rheumatism and cancer Mother deceased at age 66 due to pneumonia father deceased at age 66 due to kidney failure sister deceased a age 69 due to stroke and respiratory problems brother deceased at age 22 due to MVA  Social History: Reviewed history from 11/24/2008 and no changes required. 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  The patient denies shortness of breath with activity, shortness of breath at rest, coughing up blood, irregular heartbeats, loss of appetite, weight change, abdominal pain, difficulty swallowing, sore throat, tooth/dental problems, headaches, sneezing, itching, ear ache, anxiety, depression, hand/feet swelling, joint stiffness or pain, rash, change in color of mucus, and fever.    Vital Signs:  Patient profile:   77 year old male Height:      67.5 inches Weight:      238.25 pounds BMI:     36.90 O2 Sat:      97 % on Room air Temp:     98.1 degrees F oral Pulse rate:   73 / minute BP sitting:   118 / 78  (right arm) Cuff size:   large  Vitals Entered By: Gweneth Dimitri RN (June 12, 2009 10:12 AM)  O2 Flow:  Room air CC: Acute Visit.  c/o sore throat, wheezing, chest tightness, prod cough with a small amount of white mucus since yesterday.  Denies increased SOB.  Requesting rx for tramadol. Comments Medications reviewed with patient Daytime contact number verified with patient. Gweneth Dimitri RN  June 12, 2009 10:13 AM    Physical Exam  Additional Exam:   GENERAL:  A/Ox3; pleasant & cooperative.NAD HEENT:  [place]/AT, EOM-wnl, PERRLA, EACs-clear, TMs-wnl, NOSE-purulent L>R nares, THROAT-clear & wnl. NECK:  Supple w/ fair ROM; no JVD; normal carotid impulses w/o bruits; no thyromegaly or nodules palpated; no lymphadenopathy. CHEST: clear, exp wheezes, poor airflow HEART:  RRR, no m/r/g  heard ABDOMEN:  Soft & nt; nml bowel sounds; no organomegaly or masses detected. EXT: Warm bilat,  no calf pain, edema, clubbing, pulses intact Skin: no rash/lesion    Impression & Recommendations:  Problem # 1:  OTHER ACUTE SINUSITIS (ICD-461.8) Assessment Deteriorated Acute sinusitis with AB flare  plan Avelox one daily Prednisone 10mg  Take 4 daily for two days, then 3 daily for two days, then two daily for two days then one daily for two days then stop Nasonex two puffs daily Increase asmanex to two puff daily Return three weeks for recheck  His updated medication list for this problem includes:    Delsym 30 Mg/95ml Lqcr (Dextromethorphan polistirex) .[name]... 2 tsp every 12 hr as needed    Benzonatate 100 Mg Caps (Benzonatate) .[name]... 1-2 every 6-8 hours as needed    Avelox 400 Mg Tabs (Moxifloxacin hcl) ..... By mouth daily    Nasonex 50 Mcg/act Susp (Mometasone furoate) .[name]..[name] Two puffs each nostril daily  Orders: Est. Patient Level IV (40981)  Problem # 2:  ASTHMA (ICD-493.90) Assessment: Deteriorated Asthmatic bronchitis flare  see assessment number one  Medications Added to Medication List This Visit: 1)  Asmanex 120 Metered Doses 220 Mcg/inh Aepb (Mometasone furoate)  .... Two  puff  nightly 2)  Benzonatate 100 Mg Caps (Benzonatate) .[name].. 1-2 every 6-8 hours as needed 3)  Preservison  .... Two times a day 4)  Avelox 400 Mg Tabs (Moxifloxacin hcl) .... By mouth daily 5)  Prednisone 10 Mg Tabs (Prednisone) .... Take as directed take 4 daily for two days, then 3 daily for two days, then two daily for two days then one daily for two days then stop 6)  Nasonex 50 Mcg/act Susp (Mometasone furoate) .... Two puffs each nostril daily  Complete Medication List: 1)  Furosemide 40 Mg Tabs (Furosemide) .... 1/2 tab once daily 2)  Zocor 40 Mg Tabs (Simvastatin) .... At bedtime 3)  Flomax 0.4 Mg Caps (Tamsulosin hcl) .... Once daily 4)  Adult Aspirin Low Strength 81 Mg Tbdp (Aspirin) .... Once daily 5)  Omeprazole 20 Mg Cpdr (Omeprazole) .[name].. 1 by mouth two times a day 6)  Cpap  .... At bedtime 7)  Byetta 10 Mcg Pen 10 Mcg/0.8ml Soln (Exenatide) .... Two times a day 8)  Cozaar 50 Mg Tabs (Losartan potassium) .... 1/2 tab once daily 9)  Travatan 0.004 % Soln (Travoprost) .[name].. 1 gtt ou 10)  Metoprolol Tartrate 50 Mg Tabs (Metoprolol tartrate) .... 1/2 once daily 11)  Preservision Tabs (multiple Vitamins-minerals)  .... Two times a day 12)  Novolin 70/30 70-30 % Susp (Insulin isophane & regular) .... As directed 13)  Nitroglycerin 0.4 Mg/hr Pt24 (Nitroglycerin) .... As needed 14)  Combivent 103-18 Mcg/act Aero (Ipratropium-albuterol) .... One to two puff every 4hrs as needed 15)  Tramadol Hcl 50 Mg Tabs (Tramadol hcl) .[name].. 1 every 4 hr as needed 16)  Delsym 30 Mg/37ml Lqcr (Dextromethorphan polistirex) .... 2 tsp every 12 hr as needed 17)  Avastin 100 Mg/9ml Soln (Bevacizumab) .... Injection in eye left  every 8 weeks 18)  Singulair 10 Mg Tabs (Montelukast sodium) .... One by mouth daily 19)  Asmanex 120 Metered Doses 220 Mcg/inh Aepb (Mometasone furoate) .... Two  puff  nightly 20)  Vitamin D (ergocalciferol) 50000 Unit Caps (Ergocalciferol) .... Once weekly 21)   Benzonatate 100 Mg Caps (Benzonatate) .[name].. 1-2 every 6-8 hours as needed 22)  Preservison  .... Two times a day 23)  Avelox 400 Mg Tabs (Moxifloxacin hcl) .... By mouth daily 24)  Prednisone 10 Mg Tabs (Prednisone) .... Take as directed take 4 daily for two days, then 3 daily for two days, then two daily for two days then one daily for two days then stop 25)  Nasonex 50 Mcg/act Susp (Mometasone furoate) .Marland KitchenMarland KitchenMarland Kitchen  Two puffs each nostril daily  Patient Instructions: 1)  Avelox one daily 2)  Prednisone 10mg  Take 4 daily for two days, then 3 daily for two days, then two daily for two days then one daily for two days then stop 3)  Nasonex two puffs daily 4)  Increase asmanex to two puff daily 5)  Return three weeks for recheck  Prescriptions: NASONEX 50 MCG/ACT  SUSP (MOMETASONE FUROATE) Two puffs each nostril daily  #1 x 1   Entered and Authorized by:   Storm Frisk MD   Signed by:   Storm Frisk MD on 06/12/2009   Method used:   Electronically to        [facility] [address] [id number]* (retail)       [address] [place], [place]  [address]       Ph: [phone]       Fax: [phone]   RxID:   [id number] PREDNISONE 10 MG  TABS (PREDNISONE) Take as directed Take 4 daily for two days, then 3 daily for two days, then two daily for two days then one daily for two days then stop  #20 x 0   Entered and Authorized by:   Storm Frisk MD   Signed by:   Storm Frisk MD on 06/12/2009   Method used:   Electronically to        [facility] [address] #[id number]* (retail)       [address] [place], [place]  [address]       Ph: [phone]       Fax: [phone]   RxID:   [id number] AVELOX 400 MG  TABS (MOXIFLOXACIN HCL) By mouth daily  #5 x 0   Entered and Authorized by:   Storm Frisk MD   Signed by:   Storm Frisk MD on 06/12/2009   Method used:   Electronically to        [facility] [id number]* (retail)       [address] [place],  [place]  [address]       Ph: [phone]       Fax: [phone]   RxID:   [id number]     Appended Document: Pulmonary OV fax [facility]

## 2010-05-10 NOTE — Assessment & Plan Note (Signed)
Summary: Pulmonary OV   Primary Provider/Referring Provider:  Timothy Kaiser  CC:  3 month follow up.  Pt states breathing is doing "great except in hot weather."  Denies wheezing, chest tightness, and cough.  No complaints.  .  History of Present Illness:  Donald Kaiser is a 77 year old white male with a history of moderate intermittent asthma, obstructive sleep apnea.   July 03, 2009 2:54 PM Since last ov is much better.   The pt has finished the prednisone and avelox.  The pt is still on nasonex and asmanex. There are no new issues. Pt denies any significant sore throat, nasal congestion or excess secretions, fever, chills, sweats, unintended weight loss, pleurtic or exertional chest pain, orthopnea PND, or leg swelling Pt denies any increase in rescue therapy over baseline, denies waking up needing it or having any early am or nocturnal exacerbations of coughing/wheezing/or dyspnea.   September 29, 2009 No new issues.  No complaints.  No rescue inhaler use. Pt denies any significant sore throat, nasal congestion or excess secretions, fever, chills, sweats, unintended weight loss, pleurtic or exertional chest pain, orthopnea PND, or leg swelling Pt denies any increase in rescue therapy over baseline, denies waking up needing it or having any early am or nocturnal exacerbations of coughing/wheezing/or dyspnea.   Asthma History    Asthma Control Assessment:    Age range: 12+ years    Symptoms: 0-2 days/week    Nighttime Awakenings: 0-2/month    Interferes w/ normal activity: no limitations    SABA use (not for EIB): 0-2 days/week    Exacerbations requiring oral systemic steroids: 0-1/year    Asthma Control Assessment: Well Controlled   Preventive Screening-Counseling & Management  Alcohol-Tobacco     Alcohol drinks/day: 0     Smoking Status: never  Current Medications (verified): 1)  Furosemide 40 Mg Tabs (Furosemide) .... 1/2 Tab Once Daily 2)  Zocor 40 Mg  Tabs (Simvastatin) .... At  Bedtime 3)  Flomax 0.4 Mg Caps (Tamsulosin Hcl) .... Once Daily 4)  Adult Aspirin Low Strength 81 Mg  Tbdp (Aspirin) .... Once Daily 5)  Omeprazole 20 Mg Cpdr (Omeprazole) .[name].. 1 By Mouth Two Times A Day 6)  Cpap .... At Bedtime 7)  Byetta 10 Mcg Pen 10 Mcg/0.7ml  Soln (Exenatide) .... Two Times A Day 8)  Cozaar 50 Mg  Tabs (Losartan Potassium) .... 1/2 Tab Once Daily 9)  Travatan 0.004 %  Soln (Travoprost) .[name].. 1 Gtt Ou 10)  Metoprolol Tartrate 50 Mg  Tabs (Metoprolol Tartrate) .... 1/2 Once Daily 11)  Preservision   Tabs (Multiple Vitamins-Minerals) .... Two Times A Day 12)  Novolin 70/30 70-30 %  Susp (Insulin Isophane & Regular) .... As Directed 13)  Nitroglycerin 0.4 Mg/hr  Pt24 (Nitroglycerin) .... As Needed 14)  Combivent 103-18 Mcg/act Aero (Ipratropium-Albuterol) .... One To Two Puff Every 4hrs As Needed 15)  Tramadol Hcl 50 Mg Tabs (Tramadol Hcl) .[name].. 1 Every 4 Hr As Needed 16)  Delsym 30 Mg/28ml Lqcr (Dextromethorphan Polistirex) .... 2 Tsp Every 12 Hr As Needed 17)  Avastin 100 Mg/7ml Soln (Bevacizumab) .... Injection in Eye Left  Every 8 Weeks 18)  Singulair 10 Mg  Tabs (Montelukast Sodium) .... One By Mouth Daily 19)  Asmanex 120 Metered Doses 220 Mcg/inh  Aepb (Mometasone Furoate) .... Two  Puff  Nightly As Needed 20)  Vitamin D 2000 Unit Tabs (Cholecalciferol) .... Take 1 Tablet By Mouth Once A Day 21)  Benzonatate 100 Mg Caps (Benzonatate) .[name].. 1-2  Every 6-8 Hours As Needed 22)  Nasonex 50 Mcg/act  Susp (Mometasone Furoate) .... Two Puffs Each Nostril Daily As Needed  Allergies (verified): No Known Drug Allergies  Past History:  Past medical, surgical, family and social histories (including risk factors) reviewed, and no changes noted (except as noted below).  Past Medical History: Reviewed history from 05/10/2009 and no changes required. 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 tonsilectomy artrial by pass x 5 back surgery broken leg surgery  Family History: Reviewed history from 08/28/2007 and no changes required.   Negative for atopy or respiratory diseases family history of emphysema, allergies and asthma in sister 2 brothers have heart disease both sister and mother have rheumatism and cancer Mother deceased at age 80 due to pneumonia father deceased at age 30 due to kidney failure sister deceased a age 8 due to stroke and respiratory problems brother deceased at age 27 due to MVA  Social History: Reviewed history from 11/24/2008 and no changes required. 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    Vital Signs:  Patient profile:   77 year old male Height:      67 inches Weight:      235 pounds BMI:     36.94 O2 Sat:      96 % on Room air Temp:     97.7 degrees F oral Pulse rate:   66 / minute BP sitting:   96 / 58  (left arm) Cuff size:   large  Vitals Entered By: Gweneth Dimitri RN  (September 29, 2009 2:10 PM)  O2 Flow:  Room air CC: 3 month follow up.  Pt states breathing is doing "great except in hot weather."  Denies wheezing, chest tightness, cough.  No complaints.   Comments Medications reviewed with patient Daytime contact number verified with patient. Gweneth Dimitri RN  September 29, 2009 2:10 PM    Physical Exam  Additional Exam:   GENERAL:  A/Ox3; pleasant & cooperative.NAD HEENT:  [place]/AT, EOM-wnl, PERRLA, EACs-clear, TMs-wnl, NOSE clear THROAT-clear & wnl. NECK:  Supple w/ fair ROM; no JVD; normal carotid impulses w/o bruits; no thyromegaly or nodules palpated; no lymphadenopathy. CHEST: clear  HEART:  RRR, no m/r/g  heard ABDOMEN:  Soft & nt; nml bowel sounds; no organomegaly or masses detected. EXT: Warm bilat,  no calf pain, edema, clubbing, pulses intact Skin: no rash/lesion    Impression & Recommendations:  Problem # 1:  ASTHMA (ICD-493.90)  Asthma stable  plan No change in inhaled medications.   Maintain treatment program as currently prescribed.  Medications Added to Medication List This  Visit: 1)  Asmanex 120 Metered Doses 220 Mcg/inh Aepb (Mometasone furoate) .... Two  puff  nightly as needed 2)  Asmanex 120 Metered Doses 220 Mcg/inh Aepb (Mometasone furoate) .... One puff daily 3)  Vitamin D 2000 Unit Tabs (Cholecalciferol) .... Take 1 tablet by mouth once a day 4)  Nasonex 50 Mcg/act Susp (Mometasone furoate) .... Two puffs each nostril daily as needed  Complete Medication List: 1)  Furosemide 40 Mg Tabs (Furosemide) .... 1/2 tab once daily 2)  Zocor 40 Mg Tabs (Simvastatin) .... At bedtime 3)  Flomax 0.4 Mg Caps (Tamsulosin hcl) .... Once daily 4)  Adult Aspirin Low Strength 81 Mg Tbdp (Aspirin) .... Once daily 5)  Omeprazole 20 Mg Cpdr (Omeprazole) .[name].. 1 by mouth two times a day 6)  Cpap  .... At bedtime 7)  Byetta 10 Mcg Pen 10 Mcg/0.35ml Soln (Exenatide) .... Two times a day 8)  Cozaar 50 Mg Tabs (Losartan potassium) .... 1/2 tab once  daily 9)  Travatan 0.004 % Soln (Travoprost) .[name].. 1 gtt ou 10)  Metoprolol Tartrate 50 Mg Tabs (Metoprolol tartrate) .... 1/2 once daily 11)  Preservision Tabs (multiple Vitamins-minerals)  .... Two times a day 12)  Novolin 70/30 70-30 % Susp (Insulin isophane & regular) .... As directed 13)  Nitroglycerin 0.4 Mg/hr Pt24 (Nitroglycerin) .... As needed 14)  Combivent 103-18 Mcg/act Aero (Ipratropium-albuterol) .... One to two puff every 4hrs as needed 15)  Tramadol Hcl 50 Mg Tabs (Tramadol hcl) .[name].. 1 every 4 hr as needed 16)  Delsym 30 Mg/61ml Lqcr (Dextromethorphan polistirex) .... 2 tsp every 12 hr as needed 17)  Avastin 100 Mg/9ml Soln (Bevacizumab) .... Injection in eye left  every 8 weeks 18)  Singulair 10 Mg Tabs (Montelukast sodium) .... One by mouth daily 19)  Asmanex 120 Metered Doses 220 Mcg/inh Aepb (Mometasone furoate) .... One puff daily 20)  Vitamin D 2000 Unit Tabs (Cholecalciferol) .... Take 1 tablet by mouth once a day 21)  Benzonatate 100 Mg Caps (Benzonatate) .[name].. 1-2 every 6-8 hours as needed 22)  Nasonex 50 Mcg/act Susp (Mometasone furoate) .... Two puffs each nostril daily as needed  Other Orders: Est. Patient Level III (16109)  Patient Instructions: 1)  Use Asmanex one puff daily 2)  Stay on Singulair daily 3)  Return 4 months     Appended Document: Pulmonary OV fax [facility]

## 2010-05-10 NOTE — Miscellaneous (Signed)
Summary: Orders Update  Clinical Lists Changes  Orders: Added new Service order of Est. Patient Level III (99213) - Signed 

## 2010-05-10 NOTE — Assessment & Plan Note (Signed)
Summary: Pulmonary OV   Primary Donald Kaiser/Referring Donald Kaiser:  Donald Kaiser  CC:  4 month follow up.  Pt states is doing well overall.  Minimal cough.  Denies wheezing and chest tightness.  No complaints..  History of Present Illness:  [name] is a 77 year old white male with a history of moderate intermittent asthma, obstructive sleep apnea.   July 03, 2009 2:54 PM Since last ov is much better.   The pt has finished the prednisone and avelox.  The pt is still on nasonex and asmanex. There are no new issues. Pt denies any significant sore throat, nasal congestion or excess secretions, fever, chills, sweats, unintended weight loss, pleurtic or exertional chest pain, orthopnea PND, or leg swelling Pt denies any increase in rescue therapy over baseline, denies waking up needing it or having any early am or nocturnal exacerbations of coughing/wheezing/or dyspnea.   September 29, 2009 No new issues.  No complaints.  No rescue inhaler use. Pt denies any significant sore throat, nasal congestion or excess secretions, fever, chills, sweats, unintended weight loss, pleurtic or exertional chest pain, orthopnea PND, or leg swelling Pt denies any increase in rescue therapy over baseline, denies waking up needing it or having any early am or nocturnal exacerbations of coughing/wheezing/or dyspnea. February 01, 2010 10:27 AM Doing ok with asthma no new issues Pt denies any significant sore throat, nasal congestion or excess secretions, fever, chills, sweats, unintended weight loss, pleurtic or exertional chest pain, orthopnea PND, or leg swelling Pt denies any increase in rescue therapy over baseline, denies waking up needing it or having any early am or nocturnal exacerbations of coughing/wheezing/or dyspnea.   Asthma History    Asthma Control Assessment:    Age range: 12+ years    Symptoms: 0-2 days/week    Nighttime Awakenings: 0-2/month    Interferes w/ normal activity: no limitations    SABA use  (not for EIB): 0-2 days/week    ATAQ questionnaire: 0    Exacerbations requiring oral systemic steroids: 0-1/year    Asthma Control Assessment: Well Controlled   Current Medications (verified): 1)  Furosemide 40 Mg Tabs (Furosemide) .... 1/2 Tab Once Daily 2)  Zocor 40 Mg  Tabs (Simvastatin) .... At Bedtime 3)  Flomax 0.4 Mg Caps (Tamsulosin Hcl) .... Once Daily 4)  Adult Aspirin Low Strength 81 Mg  Tbdp (Aspirin) .... Once Daily 5)  Omeprazole 20 Mg Cpdr (Omeprazole) .[name].. 1 By Mouth Two Times A Day 6)  Cpap .... At Bedtime 7)  Byetta 10 Mcg Pen 10 Mcg/0.80ml  Soln (Exenatide) .... Two Times A Day 8)  Cozaar 50 Mg  Tabs (Losartan Potassium) .... 1/2 Tab Once Daily 9)  Travatan 0.004 %  Soln (Travoprost) .[name].. 1 Gtt Ou 10)  Metoprolol Tartrate 50 Mg  Tabs (Metoprolol Tartrate) .... 1/2 Once Daily 11)  Preservision   Tabs (Multiple Vitamins-Minerals) .... Two Times A Day 12)  Novolin 70/30 70-30 %  Susp (Insulin Isophane & Regular) .... As Directed 13)  Nitroglycerin 0.4 Mg/hr  Pt24 (Nitroglycerin) .... As Needed 14)  Combivent 103-18 Mcg/act Aero (Ipratropium-Albuterol) .... One To Two Puff Every 4hrs As Needed 15)  Tramadol Hcl 50 Mg Tabs (Tramadol Hcl) .[name].. 1 Every 4 Hr As Needed 16)  Delsym 30 Mg/40ml Lqcr (Dextromethorphan Polistirex) .... 2 Tsp Every 12 Hr As Needed 17)  Avastin 100 Mg/83ml Soln (Bevacizumab) .... Injection in Eye Left  Every 8 Weeks 18)  Singulair 10 Mg  Tabs (Montelukast Sodium) .... One By Mouth Daily 19)  Asmanex 120 Metered Doses 220 Mcg/inh  Aepb (Mometasone Furoate) .... One Puff Daily 20)  Vitamin D 2000 Unit Tabs (Cholecalciferol) .... Take 1 Tablet By Mouth Once A Day 21)  Benzonatate 100 Mg Caps (Benzonatate) .[name].. 1-2 Every 6-8 Hours As Needed 22)  Nasonex 50 Mcg/act  Susp (Mometasone Furoate) .... Two Puffs Each Nostril Daily As Needed  Allergies (verified): No Known Drug Allergies  Past History:  Past medical, surgical, family and social histories  (including risk factors) reviewed, and no changes noted (except as noted below).  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 tonsilectomy artrial by pass x 5 back surgery broken leg surgery  Family History: Reviewed history from 08/28/2007 and no changes required.   Negative for atopy or respiratory diseases family history of emphysema, allergies and asthma in sister 2 brothers have heart disease both sister and mother have rheumatism and cancer Mother deceased at age 56 due to pneumonia father deceased at age 79 due to kidney failure sister deceased a age 58 due to stroke and respiratory problems brother deceased at age 30 due to MVA  Social History: Reviewed history from 11/24/2008 and no changes required. 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    Vital Signs:  Patient profile:   77  year old male Height:      67 inches Weight:      235 pounds BMI:     36.94 O2 Sat:      98 % on Room air Temp:     98.0 degrees F oral Pulse rate:   74 / minute BP sitting:   120 / 70  (right arm) Cuff size:   large  Vitals Entered By: Gweneth Dimitri RN (February 01, 2010 10:19 AM)  O2 Flow:  Room air CC: 4 month follow up.  Pt states is doing well overall.  Minimal cough.  Denies wheezing and chest tightness.  No complaints. Comments Medications reviewed with patient Daytime contact number verified with patient. Gweneth Dimitri RN  February 01, 2010 10:21 AM    Physical Exam  Additional Exam:   GENERAL:  A/Ox3; pleasant & cooperative.NAD HEENT:  [place]/AT, EOM-wnl, PERRLA, EACs-clear, TMs-wnl, NOSE clear THROAT-clear & wnl. NECK:  Supple w/ fair ROM; no JVD; normal carotid impulses w/o bruits; no thyromegaly or nodules palpated; no lymphadenopathy. CHEST: clear  HEART:  RRR, no m/r/g  heard ABDOMEN:  Soft & nt; nml bowel sounds; no organomegaly or masses detected. EXT: Warm bilat,  no calf pain, edema, clubbing, pulses intact Skin: no rash/lesion  Impression & Recommendations:  Problem # 1:  ASTHMA (ICD-493.90) Assessment Improved  Asthma stable  plan No change in inhaled medications.   Maintain treatment program as currently prescribed.  Problem # 2:  OBSTRUCTIVE SLEEP APNEA (ICD-327.23) Assessment: Improved stable   cont cpap  Orders: Est. Patient Level III (78295)  Complete Medication List: 1)  Vitamin D 2000 Unit Tabs (Cholecalciferol) .... Take 1 tablet by mouth once a day 2)  Furosemide 40 Mg Tabs (Furosemide) .... 1/2 tab once daily 3)  Zocor 40 Mg Tabs (Simvastatin) .... At bedtime 4)  Flomax 0.4 Mg Caps (Tamsulosin hcl) .... Once daily 5)  Adult Aspirin Low Strength 81 Mg Tbdp (Aspirin) .... Once daily 6)  Cpap  .... At bedtime 7)  Byetta 10 Mcg Pen 10 Mcg/0.51ml Soln (Exenatide) .... Two times a day 8)  Cozaar 50 Mg Tabs (Losartan potassium) .... 1/2 tab  once daily 9)  Travatan 0.004 % Soln (Travoprost) .[name].. 1 gtt ou 10)  Metoprolol Tartrate 50 Mg Tabs (Metoprolol tartrate) .... 1/2 once daily 11)  Preservision Tabs (multiple Vitamins-minerals)  .... Two times a day 12)  Singulair 10 Mg Tabs (Montelukast sodium) .... One by mouth daily 13)  Asmanex 120 Metered Doses 220 Mcg/inh Aepb (Mometasone furoate) .... One puff daily 14)  Benzonatate 100 Mg Caps (Benzonatate) .[name].. 1-2 every 6-8 hours as needed 15)  Nasonex 50 Mcg/act Susp (Mometasone furoate) .... Two puffs each nostril daily as needed 16)  Novolin 70/30 70-30 % Susp (Insulin isophane & regular) .... As directed 17)  Nitroglycerin 0.4 Mg/hr Pt24 (Nitroglycerin) .... As needed 18)  Tramadol Hcl 50 Mg Tabs (Tramadol hcl) .[name].. 1 every 4 hr as needed 19)  Delsym 30 Mg/76ml Lqcr (Dextromethorphan polistirex) .... 2 tsp every 12 hr as needed 20)  Combivent 103-18 Mcg/act Aero (Ipratropium-albuterol) .... One to two puff every 4hrs as needed  Patient Instructions: 1)  Stop omeprazole 2)  Rinse out mouth once daily and brush tongue , prefer a baking soda based toothpaste.  3)  Slow deep breath with asmanex 4)  No change other medication changes 5)  Return 5 months    Immunization History:  Influenza Immunization History:    Influenza:  historical (12/28/2009)    Appended Document: Pulmonary OV fax [facility]

## 2010-05-10 NOTE — Assessment & Plan Note (Signed)
Summary: Pulmonary OV   Primary Provider/Referring Provider:  [name]  CC:  3 mo follow up.  states breathing is doing "execeptionally well."  No complaints. .  History of Present Illness:  Donald Kaiser is a 77 year old white male with a history of moderate intermittent asthma, obstructive sleep apnea.   May 10, 2009 10:31 AM Pt has not been taking symbicort for past two weeks.  The pt has been historically managed with just singulair for controlller therapy until flareups in Fall 2010 to which symbicort was added.   Is doing better,  no flare ups since last here in 10/10. Pt denies any significant sore throat, nasal congestion or excess secretions, fever, chills, sweats, unintended weight loss, pleurtic or exertional chest pain, orthopnea PND, or leg swelling Pt denies any increase in rescue therapy over baseline, denies waking up needing it or having any early am or nocturnal exacerbations of coughing/wheezing/or dyspnea.     Asthma History    Asthma Control Assessment:    Age range: 12+ years    Symptoms: 0-2 days/week    Nighttime Awakenings: 0-2/month    Interferes w/ normal activity: no limitations    SABA use (not for EIB): 0-2 days/week    ATAQ questionnaire: 0    Exacerbations requiring oral systemic steroids: 0-1/year    Asthma Control Assessment: Well Controlled   Preventive Screening-Counseling & Management  Alcohol-Tobacco     Smoking Status: never   Current Medications (verified): 1)  Furosemide 40 Mg Tabs (Furosemide) .... 1/2 Tab Once Daily 2)  Zocor 40 Mg  Tabs (Simvastatin) .... At Bedtime 3)  Flomax 0.4 Mg Caps (Tamsulosin Hcl) .... Once Daily 4)  Adult Aspirin Low Strength 81 Mg  Tbdp (Aspirin) .... Once Daily 5)  Omeprazole 20 Mg Cpdr (Omeprazole) .[name].. 1 By Mouth Two Times A Day 6)  Cpap .... At Bedtime 7)  Byetta 10 Mcg Pen 10 Mcg/0.49ml  Soln (Exenatide) .... Two Times A Day 8)  Cozaar 50 Mg  Tabs (Losartan Potassium) .... 1/2 Tab Once Daily 9)   Travatan 0.004 %  Soln (Travoprost) .[name].. 1 Gtt Ou 10)  Metoprolol Tartrate 50 Mg  Tabs (Metoprolol Tartrate) .... 1/2 Once Daily 11)  Preservision   Tabs (Multiple Vitamins-Minerals) .... Two Times A Day 12)  Novolin 70/30 70-30 %  Susp (Insulin Isophane & Regular) .... As Directed 13)  Nitroglycerin 0.4 Mg/hr  Pt24 (Nitroglycerin) .... As Needed 14)  Combivent 103-18 Mcg/act Aero (Ipratropium-Albuterol) .... One To Two Puff Every 4hrs As Needed 15)  Tramadol Hcl 50 Mg Tabs (Tramadol Hcl) .[name].. 1 Every 4 Hr As Needed 16)  Delsym 30 Mg/71ml Lqcr (Dextromethorphan Polistirex) .... 2 Tsp Every 12 Hr As Needed 17)  Avastin 100 Mg/18ml Soln (Bevacizumab) .... Injection in Eye Left 18)  Singulair 10 Mg  Tabs (Montelukast Sodium) .... One By Mouth Daily 19)  Symbicort 160-4.5 Mcg/act  Aero (Budesonide-Formoterol Fumarate) .... Two Puffs Twice Daily 20)  Vitamin D (Ergocalciferol) 50000 Unit Caps (Ergocalciferol) .... Once Weekly  Allergies (verified): No Known Drug Allergies  Past History:  Past medical, surgical, family and social histories (including risk factors) reviewed, and no changes noted (except as noted below).  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 tonsilectomy artrial by pass x 5 back surgery broken leg surgery  Family History: Reviewed history from 08/28/2007 and no changes required.   Negative for atopy or respiratory diseases family history of emphysema,  allergies and asthma in sister 2 brothers have heart disease both sister and mother have rheumatism and cancer Mother deceased at age 56 due to pneumonia father deceased at age 65 due to kidney failure sister deceased a age 23 due to stroke and respiratory problems brother deceased at age 62 due to MVA  Social History: Reviewed history from 11/24/2008 and no changes required. 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  The patient denies shortness of breath at rest, productive cough, non-productive cough, coughing up blood, chest pain, irregular heartbeats, acid heartburn, indigestion, loss of appetite, weight change, abdominal pain, difficulty swallowing, sore throat, tooth/dental problems, headaches, nasal congestion/difficulty breathing through nose, sneezing, itching, ear ache, anxiety, depression, hand/feet swelling, joint stiffness or pain, rash, change in color of mucus, and fever.    Vital Signs:  Patient profile:   77 year old male Height:      67.5 inches Weight:      240 pounds BMI:     37.17 O2 Sat:      98 % on Room air Temp:     98.2 degrees F oral Pulse rate:   77 / minute BP sitting:   124 / 66  (right arm) Cuff size:   large  Vitals Entered By: Gweneth Dimitri RN (May 10, 2009 10:24 AM)  O2 Flow:  Room air CC: 3 mo follow up.  states breathing is doing "execeptionally well."  No complaints.  Comments Medications reviewed with patient Daytime contact number verified with patient. Gweneth Dimitri RN  May 10, 2009 10:24 AM    Physical Exam  Additional Exam:   GENERAL:  A/Ox3; pleasant & cooperative.NAD HEENT:  [place]/AT, EOM-wnl, PERRLA, EACs-clear, TMs-wnl, NOSE-clear, THROAT-clear & wnl. NECK:  Supple w/ fair ROM; no JVD; normal carotid impulses w/o bruits; no thyromegaly or nodules palpated; no  lymphadenopathy. CHEST: clear, no wheezes or rhonchi HEART:  RRR, no m/r/g  heard ABDOMEN:  Soft & nt; nml bowel sounds; no organomegaly or masses detected. EXT: Warm bilat,  no calf pain, edema, clubbing, pulses intact Skin: no rash/lesion    Impression & Recommendations:  Problem # 1:  ASTHMA (ICD-493.90) Assessment Improved Severe persistent asthma with improved lower airway inflammation and cyclical cough syndrome.  Suspect GERD ppt factor along with poor lower airway inflammation control was the issue Fall 2010>>>now improved  plan change to Asmanex one puff daily and d/c symbicort cont reflux diet stay on two times a day ppi cont singulair as needed SABA  Medications Added to Medication List This Visit: 1)  Furosemide 40 Mg Tabs (Furosemide) .... 1/2 tab once daily 2)  Avastin 100 Mg/81ml Soln (Bevacizumab) .... Injection in eye left  every 8 weeks 3)  Asmanex 120 Metered Doses 220 Mcg/inh Aepb (Mometasone furoate) .... One  puff  nightly 4)  Vitamin D (ergocalciferol) 50000 Unit Caps (Ergocalciferol) .... Once weekly  Complete Medication List: 1)  Furosemide 40 Mg Tabs (  Furosemide) .... 1/2 tab once daily 2)  Zocor 40 Mg Tabs (Simvastatin) .... At bedtime 3)  Flomax 0.4 Mg Caps (Tamsulosin hcl) .... Once daily 4)  Adult Aspirin Low Strength 81 Mg Tbdp (Aspirin) .... Once daily 5)  Omeprazole 20 Mg Cpdr (Omeprazole) .[name].. 1 by mouth two times a day 6)  Cpap  .... At bedtime 7)  Byetta 10 Mcg Pen 10 Mcg/0.45ml Soln (Exenatide) .... Two times a day 8)  Cozaar 50 Mg Tabs (Losartan potassium) .... 1/2 tab once daily 9)  Travatan 0.004 % Soln (Travoprost) .[name].. 1 gtt ou 10)  Metoprolol Tartrate 50 Mg Tabs (Metoprolol tartrate) .... 1/2 once daily 11)  Preservision Tabs (multiple Vitamins-minerals)  .... Two times a day 12)  Novolin 70/30 70-30 % Susp (Insulin isophane & regular) .... As directed 13)  Nitroglycerin 0.4 Mg/hr Pt24 (Nitroglycerin) .... As needed 14)  Combivent  103-18 Mcg/act Aero (Ipratropium-albuterol) .... One to two puff every 4hrs as needed 15)  Tramadol Hcl 50 Mg Tabs (Tramadol hcl) .[name].. 1 every 4 hr as needed 16)  Delsym 30 Mg/27ml Lqcr (Dextromethorphan polistirex) .... 2 tsp every 12 hr as needed 17)  Avastin 100 Mg/87ml Soln (Bevacizumab) .... Injection in eye left  every 8 weeks 18)  Singulair 10 Mg Tabs (Montelukast sodium) .... One by mouth daily 19)  Asmanex 120 Metered Doses 220 Mcg/inh Aepb (Mometasone furoate) .... One  puff  nightly 20)  Vitamin D (ergocalciferol) 50000 Unit Caps (Ergocalciferol) .... Once weekly  Other Orders: Est. Patient Level III (04540) HFA Instruction [phone])  Patient Instructions: 1)  Stop symbicort 2)  Start Asmanex one puff daily 3)  Return 4 months Prescriptions: ASMANEX 120 METERED DOSES 220 MCG/INH  AEPB (MOMETASONE FUROATE) one  puff  nightly  #1 x 6   Entered and Authorized by:   Storm Frisk MD   Signed by:   Storm Frisk MD on 05/10/2009   Method used:   Print then Give to Patient   RxID:   [id number]    Immunization History:  Pneumovax Immunization History:    Pneumovax:  historical (03/16/2009)   Prevention & Chronic Care Immunizations   Influenza vaccine: Fluvax 3+  (12/14/2008)    Tetanus booster: Not documented    Pneumococcal vaccine: Historical  (03/16/2009)    H. zoster vaccine: Not documented  Colorectal Screening   Hemoccult: Not documented    Colonoscopy: Not documented  Other Screening   PSA: Not documented   Smoking status: never  (05/10/2009)  Diabetes Mellitus   HgbA1C: Not documented    Eye exam: Not documented    Foot exam: Not documented   High risk foot: Not documented   Foot care education: Not documented    Urine microalbumin/creatinine ratio: Not documented    Diabetes flowsheet reviewed?: Yes  Lipids   Total Cholesterol: Not documented   LDL: Not documented   LDL Direct: Not documented   HDL: Not documented    Triglycerides: Not documented  Self-Management Support :    Diabetes self-management support: Not documented  Appended Document: Pulmonary OV fax [place]

## 2010-05-17 ENCOUNTER — Ambulatory Visit (INDEPENDENT_AMBULATORY_CARE_PROVIDER_SITE_OTHER): Payer: Medicare Other | Admitting: Critical Care Medicine

## 2010-05-17 ENCOUNTER — Encounter: Payer: Self-pay | Admitting: Critical Care Medicine

## 2010-05-17 DIAGNOSIS — J45909 Unspecified asthma, uncomplicated: Secondary | ICD-10-CM

## 2010-05-17 DIAGNOSIS — J209 Acute bronchitis, unspecified: Secondary | ICD-10-CM

## 2010-05-24 NOTE — Assessment & Plan Note (Signed)
Summary: Pulmonary OV   Primary Provider/Referring Provider:  [name]  CC:  Acute Visit.   increased SOB with exertion, cough with a small amount of clear mucus, and wheezing x 4 days.[name]  History of Present Illness:  Donald Kaiser is a 77 year old white male with a history of moderate intermittent asthma, obstructive sleep apnea.   July 03, 2009 2:54 PM Since last ov is much better.   The pt has finished the prednisone and avelox.  The pt is still on nasonex and asmanex. There are no new issues. Pt denies any significant sore throat, nasal congestion or excess secretions, fever, chills, sweats, unintended weight loss, pleurtic or exertional chest pain, orthopnea PND, or leg swelling Pt denies any increase in rescue therapy over baseline, denies waking up needing it or having any early am or nocturnal exacerbations of coughing/wheezing/or dyspnea.   September 29, 2009 No new issues.  No complaints.  No rescue inhaler use. Pt denies any significant sore throat, nasal congestion or excess secretions, fever, chills, sweats, unintended weight loss, pleurtic or exertional chest pain, orthopnea PND, or leg swelling Pt denies any increase in rescue therapy over baseline, denies waking up needing it or having any early am or nocturnal exacerbations of coughing/wheezing/or dyspnea. February 01, 2010 10:27 AM Doing ok with asthma no new issues Pt denies any significant sore throat, nasal congestion or excess secretions, fever, chills, sweats, unintended weight loss, pleurtic or exertional chest pain, orthopnea PND, or leg swelling Pt denies any increase in rescue therapy over baseline, denies waking up needing it or having any early am or nocturnal exacerbations of coughing/wheezing/or dyspnea.   May 17, 2010 3:57 PM Onset sore throat,  now is better.  Now is coughing and no appt.   cough is dry.  Clear mucus when raised.  No real chest pain, notes some dyspnea.  No edema in the feet.  No real sinus  issues.  Notes more wheeze,  worse supine, seems worse than before No real heartburn and off PPI now   Current Medications (verified): 1)  Vitamin D 2000 Unit Tabs (Cholecalciferol) .... Take 1 Tablet By Mouth Once A Day 2)  Furosemide 40 Mg Tabs (Furosemide) .... 1/2 Tab Once Daily 3)  Zocor 40 Mg  Tabs (Simvastatin) .... At Bedtime 4)  Flomax 0.4 Mg Caps (Tamsulosin Hcl) .... Once Daily 5)  Adult Aspirin Low Strength 81 Mg  Tbdp (Aspirin) .... Once Daily 6)  Cpap .... At Bedtime 7)  Byetta 10 Mcg Pen 10 Mcg/0.38ml  Soln (Exenatide) .... Two Times A Day 8)  Cozaar 50 Mg  Tabs (Losartan Potassium) .... 1/2 Tab Once Daily 9)  Travatan 0.004 %  Soln (Travoprost) .[name].. 1 Gtt Ou 10)  Metoprolol Tartrate 50 Mg  Tabs (Metoprolol Tartrate) .... 1/2 Once Daily 11)  Ocuvite Preservision  Tabs (Multiple Vitamins-Minerals) .... Take 1 Capsule By Mouth Two Times A Day 12)  Singulair 10 Mg  Tabs (Montelukast Sodium) .... One By Mouth Daily 13)  Asmanex 120 Metered Doses 220 Mcg/inh  Aepb (Mometasone Furoate) .... One Puff Daily 14)  Benzonatate 100 Mg Caps (Benzonatate) .[name].. 1-2 Every 6-8 Hours As Needed 15)  Nasonex 50 Mcg/act  Susp (Mometasone Furoate) .... Two Puffs Each Nostril Daily As Needed 16)  Novolin 70/30 70-30 %  Susp (Insulin Isophane & Regular) .... As Directed 17)  Nitroglycerin 0.4 Mg/hr  Pt24 (Nitroglycerin) .... As Needed 18)  Tramadol Hcl 50 Mg Tabs (Tramadol Hcl) .[name].. 1 Every 4 Hr As  Needed 19)  Delsym 30 Mg/37ml Lqcr (Dextromethorphan Polistirex) .... 2 Tsp Every 12 Hr As Needed 20)  Combivent 103-18 Mcg/act Aero (Ipratropium-Albuterol) .... One To Two Puff Every 4hrs As Needed  Allergies (verified): No Known Drug Allergies  Past History:  Past medical, surgical, family and social histories (including risk factors) reviewed, and no changes noted (except as noted below).  Past Medical History: Reviewed history from 02/01/2010 and no changes required. OBSTRUCTIVE SLEEP APNEA  (ICD-327.23)    -Cpap  full face mask G E R D (ICD-530.81) DIABETES, TYPE 2 (ICD-250.00) ASTHMA (ICD-493.90) Recurrent Pulmonary Embolism    -1997    -2005 recurrent PE with INR <2.0 DVT chronic      -s/p IVC filter 2005, complicated by retroperitoneal bleeding with hydronephrosis R renal system     -complete occlusion of bilateral femoral veins with collateral circulation resulting in severe chronic bilateral venous stasis/post phlebitic syndrome  Past Surgical History: Reviewed history from 11/24/2008 and no changes required. tonsilectomy artrial by pass x 5 back surgery broken leg surgery  Family History: Reviewed history from 08/28/2007 and no changes required.   Negative for atopy or respiratory diseases family history of emphysema, allergies and asthma in sister 2 brothers have heart disease both sister and mother have rheumatism and cancer Mother deceased at age 75 due to pneumonia father deceased at age 38 due to kidney failure sister deceased a age 68 due to stroke and respiratory problems brother deceased at age 65 due to MVA  Social History: Reviewed history from 11/24/2008 and no changes required. 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  The patient denies shortness of breath at rest, coughing up blood, chest pain, irregular heartbeats, acid heartburn, indigestion, loss of appetite, weight change, abdominal pain, difficulty swallowing, sore throat, tooth/dental problems, headaches, sneezing, itching, ear ache, anxiety, depression, hand/feet swelling, joint stiffness or pain, rash, change in color of mucus, and fever.    Vital Signs:  Patient profile:   77 year old male Height:      67 inches Weight:      231 pounds BMI:     36.31 O2 Sat:      99  % on Room air Temp:     98.1 degrees F oral Pulse rate:   69 / minute BP sitting:   100 / 70  (left arm) Cuff size:   large  Vitals Entered By: Gweneth Dimitri RN (May 17, 2010 3:40 PM)  O2 Flow:  Room air CC: Acute Visit.   increased SOB with exertion, cough with a small amount of clear mucus, wheezing x 4 days. Comments Medications reviewed with patient Daytime contact number verified with patient. Gweneth Dimitri RN  May 17, 2010 3:42 PM    Physical Exam  Additional Exam:   GENERAL:  A/Ox3; pleasant & cooperative.NAD HEENT:  [place]/AT, EOM-wnl, PERRLA, EACs-clear, TMs-wnl, NOSE clear THROAT-clear & wnl. NECK:  Supple w/ fair ROM; no JVD; normal carotid impulses w/o bruits; no thyromegaly or nodules palpated; no lymphadenopathy. CHEST: exp wheeze  HEART:  RRR, no m/r/g  heard ABDOMEN:  Soft & nt; nml bowel sounds; no organomegaly or masses detected. EXT: Warm bilat,  no calf pain, edema, clubbing, pulses intact Skin: no rash/lesion    Impression & Recommendations:  Problem # 1:  ACUTE  BRONCHITIS (ICD-466.0) Assessment Deteriorated acute tracheobronchitis with flare plan Azithromycin 250mg  two times one dose then one a day until gone A depomedrol 120mg  IM will be given Double asmanex to two puff daily until sample is gone then reduce to one puff daily Return 1 month for recheck His updated medication list for this problem includes:    Singulair 10 Mg Tabs (Montelukast sodium) ..... One by mouth daily    Asmanex 120 Metered Doses 220 Mcg/inh Aepb (Mometasone furoate) .[name]..[name] Two  puffs  daily for 7days then reduce to one puff daily    Benzonatate 100 Mg Caps (Benzonatate) .[name]... 1-2 every 6-8 hours as needed    Delsym 30 Mg/72ml Lqcr (Dextromethorphan polistirex) .[name]... 2 tsp every 12 hr as needed    Combivent 103-18 Mcg/act Aero (Ipratropium-albuterol) ..... One to two puff every 4hrs as needed    Azithromycin 250 Mg Tabs (Azithromycin) .[name]... Take two by mouth times one dose  then one daily until gone  Orders: Est. Patient Level IV (16109)  Medications Added to Medication List This Visit: 1)  Ocuvite Preservision Tabs (Multiple vitamins-minerals) .... Take 1 capsule by mouth two times a day 2)  Asmanex 120 Metered Doses 220 Mcg/inh Aepb (Mometasone furoate) .... Two  puffs  daily for 7days then reduce to one puff daily 3)  Azithromycin 250 Mg Tabs (Azithromycin) .... Take two by mouth times one dose then one daily until gone  Complete Medication List: 1)  Vitamin D 2000 Unit Tabs (Cholecalciferol) .... Take 1 tablet by mouth once a day 2)  Furosemide 40 Mg Tabs (Furosemide) .... 1/2 tab once daily 3)  Zocor 40 Mg Tabs (Simvastatin) .... At bedtime 4)  Flomax 0.4 Mg Caps (Tamsulosin hcl) .... Once daily 5)  Adult Aspirin Low Strength 81 Mg Tbdp (Aspirin) .... Once daily 6)  Cpap  .... At bedtime 7)  Byetta 10 Mcg Pen 10 Mcg/0.37ml Soln (Exenatide) .... Two times a day 8)  Cozaar 50 Mg Tabs (Losartan potassium) .... 1/2 tab once daily 9)  Travatan 0.004 % Soln (Travoprost) .[name].. 1 gtt ou 10)  Metoprolol Tartrate 50 Mg Tabs (Metoprolol tartrate) .... 1/2 once daily 11)  Ocuvite Preservision Tabs (Multiple vitamins-minerals) .... Take 1 capsule by mouth two times a day 12)  Singulair 10 Mg Tabs (Montelukast sodium) .... One by mouth daily 13)  Asmanex 120 Metered Doses 220 Mcg/inh Aepb (Mometasone furoate) .... Two  puffs  daily for 7days then reduce to one puff daily 14)  Benzonatate 100 Mg Caps (Benzonatate) .[name].. 1-2 every 6-8 hours as needed 15)  Nasonex 50 Mcg/act Susp (Mometasone furoate) .... Two puffs each nostril daily as needed 16)  Novolin 70/30 70-30 % Susp (Insulin isophane & regular) .... As directed 17)  Nitroglycerin 0.4 Mg/hr Pt24 (Nitroglycerin) .... As needed 18)  Tramadol Hcl 50 Mg Tabs (Tramadol hcl) .[name].. 1 every 4 hr as needed 19)  Delsym 30 Mg/22ml Lqcr (Dextromethorphan polistirex) .... 2 tsp every 12 hr as needed 20)  Combivent 103-18  Mcg/act Aero (Ipratropium-albuterol) .... One to two puff every 4hrs as needed 21)  Azithromycin 250 Mg Tabs (Azithromycin) .... Take two by mouth times one dose then one daily until gone  Patient Instructions: 1)  Azithromycin 250mg  two times one dose then one a day until gone 2)  A depomedrol 120mg  IM will be given 3)  Double asmanex to two puff daily until sample is gone then reduce to one puff daily 4)  Return 1 month for recheck  Prescriptions: AZITHROMYCIN 250 MG TABS (AZITHROMYCIN) take two by mouth times one dose then one daily until gone  #7 x 0   Entered and Authorized by:   Storm Frisk MD   Signed by:   Storm Frisk MD on 05/17/2010   Method used:   Electronically to        [facility] [address] [id number]* (retail)       [address] [place], [place]  [address]       Ph: [phone]       Fax: [phone]   RxID:   [id number]

## 2010-06-19 LAB — DIFFERENTIAL
Basophils Relative: 1 % (ref 0–1)
Eosinophils Absolute: 0.1 10*3/uL (ref 0.0–0.7)
Eosinophils Relative: 1 % (ref 0–5)
Lymphocytes Relative: 27 % (ref 12–46)
Lymphs Abs: 2.8 10*3/uL (ref 0.7–4.0)
Monocytes Absolute: 0.8 10*3/uL (ref 0.1–1.0)
Monocytes Relative: 8 % (ref 3–12)
Neutrophils Relative %: 63 % (ref 43–77)

## 2010-06-19 LAB — CBC
Hemoglobin: 16.3 g/dL (ref 13.0–17.0)
MCH: 30.1 pg (ref 26.0–34.0)
MCHC: 34.2 g/dL (ref 30.0–36.0)
MCV: 88.1 fL (ref 78.0–100.0)
RBC: 5.41 MIL/uL (ref 4.22–5.81)

## 2010-06-19 LAB — POCT CARDIAC MARKERS
CKMB, poc: <1 ng/mL — ABNORMAL LOW (ref 1.0–8.0)
Myoglobin, poc: 57.4 ng/mL (ref 12–200)
Myoglobin, poc: 63.4 ng/mL (ref 12–200)
Troponin i, poc: <0.05 ng/mL (ref 0.00–0.09)

## 2010-06-19 LAB — BASIC METABOLIC PANEL
CO2: 22 mEq/L (ref 19–32)
Chloride: 107 mEq/L (ref 96–112)
GFR calc Af Amer: 59 mL/min — ABNORMAL LOW (ref >60)
Glucose, Bld: 174 mg/dL — ABNORMAL HIGH (ref 70–99)
Sodium: 143 mEq/L (ref 135–145)

## 2010-06-19 LAB — GLUCOSE, CAPILLARY: Glucose-Capillary: 163 mg/dL — ABNORMAL HIGH (ref 70–99)

## 2010-06-21 ENCOUNTER — Encounter: Payer: Self-pay | Admitting: Critical Care Medicine

## 2010-06-21 ENCOUNTER — Ambulatory Visit (INDEPENDENT_AMBULATORY_CARE_PROVIDER_SITE_OTHER): Payer: Medicare Other | Admitting: Critical Care Medicine

## 2010-06-21 DIAGNOSIS — J45909 Unspecified asthma, uncomplicated: Secondary | ICD-10-CM

## 2010-06-26 NOTE — Assessment & Plan Note (Signed)
Summary: Pulmonary OV   Primary Provider/Referring Provider:  [name]  CC:  1 month follow up. Pt states he is better but still having increased SOB with exertion, fatigue, and hoarseness.  [name]  History of Present Illness:  Mr. Blank is a 77 year old white male with a history of moderate intermittent asthma, obstructive sleep apnea.   July 03, 2009 2:54 PM Since last ov is much better.   The pt has finished the prednisone and avelox.  The pt is still on nasonex and asmanex. There are no new issues. Pt denies any significant sore throat, nasal congestion or excess secretions, fever, chills, sweats, unintended weight loss, pleurtic or exertional chest pain, orthopnea PND, or leg swelling Pt denies any increase in rescue therapy over baseline, denies waking up needing it or having any early am or nocturnal exacerbations of coughing/wheezing/or dyspnea.   September 29, 2009 No new issues.  No complaints.  No rescue inhaler use. Pt denies any significant sore throat, nasal congestion or excess secretions, fever, chills, sweats, unintended weight loss, pleurtic or exertional chest pain, orthopnea PND, or leg swelling Pt denies any increase in rescue therapy over baseline, denies waking up needing it or having any early am or nocturnal exacerbations of coughing/wheezing/or dyspnea. February 01, 2010 10:27 AM Doing ok with asthma no new issues Pt denies any significant sore throat, nasal congestion or excess secretions, fever, chills, sweats, unintended weight loss, pleurtic or exertional chest pain, orthopnea PND, or leg swelling Pt denies any increase in rescue therapy over baseline, denies waking up needing it or having any early am or nocturnal exacerbations of coughing/wheezing/or dyspnea.   May 17, 2010 3:57 PM Onset sore throat,  now is better.  Now is coughing and no appt.   cough is dry.  Clear mucus when raised.  No real chest pain, notes some dyspnea.  No edema in the feet.  No real  sinus issues.  Notes more wheeze,  worse supine, seems worse than before No real heartburn and off PPI now   June 21, 2010 2:02 PM Sore throat is better and ocugh is gone. Still is hoarse in the throat.  No real mucus.  Notes dyspnea with exertion, and pn drip is noted   Asthma History    Asthma Control Assessment:    Age range: 12+ years    Symptoms: 0-2 days/week    Nighttime Awakenings: 0-2/month    Interferes w/ normal activity: no limitations    SABA use (not for EIB): 0-2 days/week    ATAQ questionnaire: 0    Exacerbations requiring oral systemic steroids: 0-1/year    Asthma Control Assessment: Well Controlled   Current Medications (verified): 1)  Vitamin D 2000 Unit Tabs (Cholecalciferol) .... Take 1 Tablet By Mouth Once A Day 2)  Furosemide 40 Mg Tabs (Furosemide) .... 1/2 Tab Once Daily 3)  Zocor 40 Mg  Tabs (Simvastatin) .... At Bedtime 4)  Flomax 0.4 Mg Caps (Tamsulosin Hcl) .... Once Daily 5)  Adult Aspirin Low Strength 81 Mg  Tbdp (Aspirin) .... Once Daily 6)  Cpap .... At Bedtime 7)  Byetta 10 Mcg Pen 10 Mcg/0.17ml  Soln (Exenatide) .... Once Daily 8)  Cozaar 50 Mg  Tabs (Losartan Potassium) .... 1/2 Tab Once Daily 9)  Travatan 0.004 %  Soln (Travoprost) .[name].. 1 Gtt Ou 10)  Metoprolol Tartrate 50 Mg  Tabs (Metoprolol Tartrate) .... 1/2 Once Daily 11)  Preservision Areds  Caps (Multiple Vitamins-Minerals) .... Take 1 Capsule By Mouth Two Times  A Day 12)  Singulair 10 Mg  Tabs (Montelukast Sodium) .... One By Mouth Daily 13)  Asmanex 120 Metered Doses 220 Mcg/inh  Aepb (Mometasone Furoate) .... One Puff Daily 14)  Benzonatate 100 Mg Caps (Benzonatate) .[name].. 1-2 Every 6-8 Hours As Needed 15)  Nasonex 50 Mcg/act  Susp (Mometasone Furoate) .... Two Puffs Each Nostril Daily As Needed 16)  Novolin 70/30 70-30 %  Susp (Insulin Isophane & Regular) .... As Directed 17)  Nitroglycerin 0.4 Mg/hr  Pt24 (Nitroglycerin) .... As Needed 18)  Tramadol Hcl 50 Mg Tabs (Tramadol Hcl)  .[name].. 1 Every 4 Hr As Needed 19)  Delsym 30 Mg/67ml Lqcr (Dextromethorphan Polistirex) .... 2 Tsp Every 12 Hr As Needed 20)  Combivent 103-18 Mcg/act Aero (Ipratropium-Albuterol) .... One To Two Puff Every 4hrs As Needed  Allergies (verified): No Known Drug Allergies  Past History:  Past medical, surgical, family and social histories (including risk factors) reviewed, and no changes noted (except as noted below).  Past Medical History: Reviewed history from 02/01/2010 and no changes required. 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 tonsilectomy artrial by pass x 5 back surgery broken leg surgery  Family History: Reviewed history from 08/28/2007 and no changes required.   Negative for atopy or respiratory diseases family history of emphysema, allergies and asthma in sister 2 brothers have heart disease both sister and mother have rheumatism and cancer Mother deceased at age 72 due to pneumonia father deceased at age 102 due to kidney failure sister deceased a age 62 due to stroke and respiratory problems brother deceased at age 46 due to MVA  Social History: Reviewed history from 11/24/2008 and no changes required. never smoker, retired Optician, dispensing. married with childr lives with wife retired  Review of Systems       The patient complains of shortness of breath with activity.  The patient denies shortness of breath at rest, productive cough, non-productive cough,  coughing up blood, chest pain, irregular heartbeats, acid heartburn, indigestion, loss of appetite, weight change, abdominal pain, difficulty swallowing, sore throat, tooth/dental problems, headaches, nasal congestion/difficulty breathing through nose, sneezing, itching, ear ache, anxiety, depression, hand/feet swelling, joint stiffness or pain, rash, change in color of mucus, and fever.    Vital Signs:  Patient profile:   77 year old male Height:      67.5 inches Weight:      232 pounds BMI:     35.93 O2 Sat:      97 % on Room air Temp:     98.2 degrees F oral Pulse rate:   79 / minute BP sitting:   100 / 60  (right arm) Cuff size:   large  Vitals Entered By: Gweneth Dimitri RN (June 21, 2010 1:56 PM)  O2 Flow:  Room air CC: 1 month follow up. Pt states he is better but still having increased SOB with exertion, fatigue, hoarseness.   Comments Medications reviewed with patient Daytime contact number verified with patient. Gweneth Dimitri RN  June 21, 2010 1:56 PM    Physical Exam  Additional Exam:   GENERAL:  A/Ox3; pleasant & cooperative.NAD HEENT:  [place]/AT, EOM-wnl, PERRLA, EACs-clear, TMs-wnl, NOSE clear THROAT-clear & wnl. NECK:  Supple w/ fair ROM; no JVD; normal carotid impulses w/o bruits; no thyromegaly or nodules palpated; no lymphadenopathy. CHEST: clear HEART:  RRR, no m/r/g  heard ABDOMEN:  Soft & nt; nml bowel sounds; no organomegaly or masses detected. EXT: Warm bilat,  no calf pain, edema, clubbing, pulses intact Skin: no rash/lesion    Impression & Recommendations:  Problem # 1:  ASTHMA (ICD-493.90) Assessment Improved  Asthma stable  plan No change in inhaled medications.   Maintain treatment program as currently prescribed.  Medications Added to Medication List This Visit: 1)  Byetta 10 Mcg Pen 10 Mcg/0.10ml Soln (Exenatide) .... Once daily 2)  Preservision Areds Caps (Multiple vitamins-minerals) .... Take 1 capsule by mouth two times a day 3)  Asmanex  120 Metered Doses 220 Mcg/inh Aepb (Mometasone furoate) .... One puff daily  Complete Medication List: 1)  Vitamin D 2000 Unit Tabs (Cholecalciferol) .... Take 1 tablet by mouth once a day 2)  Furosemide 40 Mg Tabs (Furosemide) .... 1/2 tab once daily 3)  Zocor 40 Mg Tabs (Simvastatin) .... At bedtime 4)  Flomax 0.4 Mg Caps (Tamsulosin hcl) .... Once daily 5)  Adult Aspirin Low Strength 81 Mg Tbdp (Aspirin) .... Once daily 6)  Cpap  .... At bedtime 7)  Byetta 10 Mcg Pen 10 Mcg/0.7ml Soln (Exenatide) .... Once daily 8)  Cozaar 50 Mg Tabs (Losartan potassium) .... 1/2 tab once daily 9)  Travatan 0.004 % Soln (Travoprost) .[name].. 1 gtt ou 10)  Metoprolol Tartrate 50 Mg Tabs (Metoprolol tartrate) .... 1/2 once daily 11)  Preservision Areds Caps (Multiple vitamins-minerals) .... Take 1 capsule by mouth two times a day 12)  Singulair 10 Mg Tabs (Montelukast sodium) .... One by mouth daily 13)  Asmanex 120 Metered Doses 220 Mcg/inh Aepb (Mometasone furoate) .... One puff daily 14)  Benzonatate 100 Mg Caps (Benzonatate) .[name].. 1-2 every 6-8 hours as needed 15)  Nasonex 50 Mcg/act Susp (Mometasone furoate) .... Two puffs each nostril daily as needed 16)  Novolin 70/30 70-30 % Susp (Insulin isophane & regular) .... As directed 17)  Nitroglycerin 0.4 Mg/hr Pt24 (Nitroglycerin) .... As needed 18)  Tramadol Hcl 50 Mg Tabs (Tramadol hcl) .[name].. 1 every 4 hr as needed 19)  Delsym 30 Mg/64ml Lqcr (Dextromethorphan polistirex) .... 2 tsp every 12 hr as needed 20)  Combivent 103-18 Mcg/act Aero (Ipratropium-albuterol) .... One to two puff every 4hrs as needed  Other Orders: Est. Patient Level III (16109)  Patient Instructions: 1)  No change in medications 2)  Return in      3    months

## 2010-07-12 ENCOUNTER — Other Ambulatory Visit: Payer: Self-pay | Admitting: Family

## 2010-07-12 ENCOUNTER — Encounter (HOSPITAL_BASED_OUTPATIENT_CLINIC_OR_DEPARTMENT_OTHER): Payer: Medicare Other | Admitting: Hematology & Oncology

## 2010-07-12 DIAGNOSIS — D45 Polycythemia vera: Secondary | ICD-10-CM

## 2010-07-12 DIAGNOSIS — Z7982 Long term (current) use of aspirin: Secondary | ICD-10-CM

## 2010-07-12 LAB — CBC WITH DIFFERENTIAL (CANCER CENTER ONLY)
BASO%: 0.2 % (ref 0.0–2.0)
LYMPH#: 1.5 10*3/uL (ref 0.9–3.3)
LYMPH%: 24 % (ref 14.0–48.0)
MCV: 87 fL (ref 82–98)
MONO#: 0.4 10*3/uL (ref 0.1–0.9)
NEUT#: 4.1 10*3/uL (ref 1.5–6.5)
Platelets: 88 10*3/uL — ABNORMAL LOW (ref 145–400)
RDW: 14.7 % (ref 11.1–15.7)
WBC: 6.1 10*3/uL (ref 4.0–10.0)

## 2010-07-12 LAB — BASIC METABOLIC PANEL
CO2: 27 mEq/L (ref 19–32)
Calcium: 9.7 mg/dL (ref 8.4–10.5)
Potassium: 4 mEq/L (ref 3.5–5.3)
Sodium: 139 mEq/L (ref 135–145)

## 2010-08-21 NOTE — Procedures (Signed)
CAROTID DUPLEX EXAM   INDICATION:  Bruit.   HISTORY:  Diabetes:  Yes  Cardiac:  CABG in 1996  Hypertension:  Yes  Smoking:  No  Previous Surgery:  No  CV History:  No  Amaurosis Fugax No, Paresthesias No, Hemiparesis No                                       RIGHT             LEFT  Brachial systolic pressure:         110               105  Brachial Doppler waveforms:         triphasic         triphasic  Vertebral direction of flow:        antegrade         antegrade  DUPLEX VELOCITIES (cm/sec)  CCA peak systolic                   116               147  ECA peak systolic                   111               79  ICA peak systolic                   98                120  ICA end diastolic                   24                18  PLAQUE MORPHOLOGY:                  calcific  calcific/heterogeneous  PLAQUE AMOUNT:                      mild              mild  PLAQUE LOCATION:                    ICA/ECA           ICA   IMPRESSION:  1. Right internal carotid artery suggests 0% to 39% stenosis.  2. Left internal carotid artery suggests 40% to 59% stenosis on low      end of range.  3. Antegrade flow in bilateral vertebrals.   ___________________________________________  Di Kindle. Edilia Bo, M.D.   CB/MEDQ  D:  02/02/2009  T:  02/03/2009  Job:  [id number]

## 2010-08-21 NOTE — Assessment & Plan Note (Signed)
[facility]                             PULMONARY OFFICE NOTE   Donald Kaiser, Donald Kaiser                     MRN:          [id number]  DATE:11/27/2006                            DOB:          1927/03/27    [name] is a 77 year old white male with a history of moderate  intermittent asthma, obstructive sleep apnea.  He is actually doing well  controlled on Singulair 10 mg a day and p.r.n. albuterol.  He has not  required his albuterol recently.   PHYSICAL EXAMINATION:  VITAL SIGNS:  Temp 99, blood pressure 118/70,  pulse 70, saturation 96% room air.  CHEST:  Showed to be completely clear with no evidence of adventitious  breath sounds.  No wheeze or rhonchi noted.  CARDIAC:  Showed a regular rate and rhythm without S3.  Normal S1 and  S2.  ABDOMEN:  Soft, nontender.  EXTREMITIES:  Showed no edema or clubbing.   IMPRESSION:  Mild intermittent asthma, stable and well controlled on  Singulair only.   PLAN:  Maintain Singulair as is and p.r.n. albuterol and continue CPAP  h.s. for stable sleep apnea.  Return for a recheck in 6 months.     Charlcie Cradle Delford Field, MD, [facility]  Electronically Signed    PEW/MedQ  DD: 11/27/2006  DT: 11/28/2006  Job #: [id number]   cc:   [name], MD

## 2010-08-24 NOTE — Op Note (Signed)
   NAME:  Donald Kaiser, Donald Kaiser                        ACCOUNT NO.:  [id number]   MEDICAL RECORD NO.:  [id number]                   PATIENT TYPE:  INP   LOCATION:  0101                                 FACILITY:  [facility]   PHYSICIAN:  Kerrin Champagne, M.D.                DATE OF BIRTH:  Jul 15, 1926   DATE OF PROCEDURE:  12/15/2002  DATE OF DISCHARGE:                                 OPERATIVE REPORT   PREOPERATIVE DIAGNOSES:  Right bimalleolar fracture dislocation of the  ankle.  Dictation ended here.                                                 Kerrin Champagne, M.D.    Myra Rude  D:  12/15/2002  T:  12/16/2002  Job:  [id number]

## 2010-08-24 NOTE — Discharge Summary (Signed)
NAME:  Donald Kaiser, Donald Kaiser                        ACCOUNT NO.:  [id number]   MEDICAL RECORD NO.:  [id number]                   PATIENT TYPE:  INP   LOCATION:  0463                                 FACILITY:  [facility]   PHYSICIAN:  Kerrin Champagne, M.D.                DATE OF BIRTH:  07-02-1926   DATE OF ADMISSION:  12/15/2002  DATE OF DISCHARGE:  12/21/2002                                 DISCHARGE SUMMARY   ADMISSION DIAGNOSES:  1. Right angle bimalleolar fracture, dislocation, closed.  2. Type 2 diabetes mellitus, insulin-dependent.  3. Asthma.  4. Chronic obstructive pulmonary disease.  5. Polycythemia vera.  6. Peripheral diabetic neuropathy.  7. Benign prostatic hypertrophy.  8. Peptic ulcer disease.  9. Hypertension.  10.      Coronary artery disease, status post coronary artery bypass graft     surgery x5 in 1996.  11.      Cataract surgery of right eye in 2001, and left eye in 2004.  12.      Glaucoma.  13.      Diabetic retinopathy.  14.      Severe degenerative disk disease of the lumbar spine.  15.      Spondylosis of the cervical spine.  16.      Chronic renal insufficiency.  17.      Gastroesophageal reflux disease.  18.      Sprain of the left shoulder.   DISCHARGE DIAGNOSES:  1. Right angle bimalleolar fracture, dislocation, closed.  2. Type 2 diabetes mellitus, insulin-dependent.  3. Asthma.  4. Chronic obstructive pulmonary disease.  5. Polycythemia vera.  6. Peripheral diabetic neuropathy.  7. Benign prostatic hypertrophy.  8. Peptic ulcer disease.  9. Hypertension.  10.      Coronary artery disease status post coronary artery bypass graft     surgery x5 in 1996.  11.      Cataract surgery of right eye, in 2001, left eye in 2004.  12.      Glaucoma.  13.      Diabetic retinopathy.  14.      Severe degenerative disk disease of the lumbar spine.  15.      Spondylosis of the cervical spine.  16.      Chronic renal insufficiency.  17.      Gastroesophageal  reflux disease.  18.      Postoperative anemia, without need for blood transfusion.  19.      Sprain of the left shoulder.   PROCEDURE:  On December 15, 2002, the patient underwent an open reduction  and internal fixation of the right bimalleolar ankle fracture, dislocation,  performed by Dr. Kerrin Champagne, assisted by Wende Neighbors, P.A.-C. under  general anesthesia.   CONSULTATIONS:  Dr. [name].   HISTORY:  The patient is a 77 year old white male who fell at his home on  the day of  admission.  He was ambulating to the mailbox and slipped on wet  grass, landing on his right ankle.  He had the immediate onset of pain of  the right ankle, and was unable to ambulate or tolerate weightbearing on the  right lower extremity.  He was brought to [facility] Emergency  Room for further evaluation.  X-rays revealed a right ankle fracture,  dislocation.  It was felt that the patient would require surgical  intervention for an open reduction, internal fixation of the right ankle  fracture.  Due to the multiple comorbidities, a medical consultation was  necessary.  Dr. [name] saw the patient in the emergency room and gave medical  clearance for surgical intervention.   HOSPITAL COURSE:  After a thorough evaluation from a medical standpoint, the  patient was taken to surgery for the above-stated procedure.  He tolerated  this under general anesthesia without complications.  Postoperatively all of  his preoperative medications were resumed.  His insulin was adjusted via the  Glucomander.  Blood sugars have been known to be somewhat uncontrolled at  home, and the Glucomander was very helpful in maintaining better  normalization of his blood sugars.  Dr. [name] did continue to adjust his  dosage of insulin throughout the hospital stay.  Blood sugars ranged as high  as 268.  On the day prior to discharge the blood sugar was as low as 171.  The patient had no other medical  complications during the hospital stay.  His hypertension was well-controlled.  The patient was placed on Lovenox for  deep vein thrombosis prophylaxis postoperatively.  He remained on  subcutaneous Lovenox treatment throughout the hospital stay, and this will  be discontinued at discharge.  He will be on aspirin 325 mg enteric-coated  b.i.d. for deep vein thrombosis prophylaxis.  A TED stocking to his left  lower extremity will be necessary, to decrease the amount of edema, and also  assist with deep vein thrombosis prophylaxis.  From an orthopedic  standpoint, the patient was started on a physical therapy program for  ambulation and gait training.  He was to be strict and non-weightbearing on  the right lower extremity utilizing a walker.  He was very slow to progress  with activity.  He did have some difficulty secondary to his size, as well  as the nature of trying to be non-weightbearing on the lower extremities,  which do have some degree of diabetic neuropathy.  The patient and family  agreed that he will need further assistance prior to discharge, and  therefore a case manager consultation was obtained.  It was felt that the  patient would best benefit from a short-term nursing placement and bed  possibilities were sought.  He had good sensation and capillary refill of  the toes of the right foot throughout the hospital stay.  His flank was  noted to be in good condition throughout the hospital stay.  He did have  some mild irritation of the posterior aspect of the right knee from rubbing  of the soft splint.  The splint was trimmed on December 20, 2002, and no  further complications were noted with the splinting.  The patient had no  nausea or vomiting during the hospital stay.  He was voiding well and did  have bowel movements at least every other day during the hospital stay.  The patient had a couple of low-grade fevers throughout his inpatient care, but  was primarily afebrile  with vital  signs stable.  On December 20, 2002, the patient and family were notified of a bed offer  from [facility].  The patient was seen and examined, and  felt to be stable from the orthopedic as well as a medical standpoint for  discharge on December 21, 2002.   LABORATORY DATA:  Admission CBC with WBC 7.7, hemoglobin 13.6, hematocrit  40.9, RBC 5.28 on admission.  Postoperatively hemoglobin dropped to the  lowest value of 11.8 with hematocrit of 35.0.  Coagulation studies on  admission were within normal limits.  Chemistry studies on admission were  normal, with the exception of glucose 258.  BMET on December 17, 2002,  showed values normal, with the exception of glucose of 174 and calcium of  8.0.  Also noted on admission BMET was a calcium of 8.2, total protein 5.4,  albumin 3.1, with the remaining values normal.  X-rays of the left shoulder on admission showed no definite fracture or  dislocation or foreign body.  X-rays of the left humerus showed normal left  humerus films on admission.  A chest x-ray on admission showed no evidence  of active disease within the chest.  Previous coronary artery bypass graft  surgery noted, and the heart mildly enlarged.  Electrocardiogram on admission showed findings of normal sinus rhythm,  nonspecific T-wave abnormality with abnormal electrocardiogram, confirmed by  Dr. Willa Rough.   PLAN:  The patient is being transferred to [facility].  There he should continue to receive physical therapy daily for ambulation  and gait training.  He will need to be strict and non-weightbearing on the  left lower extremity and using a walker for ambulation.  This patient will  require assistance from bed to chair, bed to wheelchair, and bed to bathroom  at all times until he is stable with transfers on his own.  Occupational  therapy should see him for ADLs as well.  When the patient is at rest,  either in the  recliner or the bed, he should have his right foot elevated  above his heart as much as possible, to decrease the amount of edema there.  He should be encouraged in motion of the toes.  As for his left lower  extremity, a TED stocking would be of benefit if there is one available to  fit his left leg for deep vein thrombosis prophylaxis and decrease in edema.   FOLLOW UP:  1. The patient should follow up with Dr. Kerrin Champagne in two weeks from     the date of his surgery, for the removal of his splint, staples, and     reapplication of a cast.  His family has been provided the office number     and advised to make the follow-up appointment.  If the patient is stable     with transfers, he may be transferred to our office by car; however, if     he is still having difficulty maintaining the non-weightbearing status,     he should be transferred by ambulance only. 2. His medical issues will be addressed by the physicians at the nursing     facility.  3. The patient will follow up with Dr. [name] per his instructions.   DISCHARGE MEDICATIONS:  1. Cozaar 50 mg p.o. daily.  2. Protonix 40 mg p.o. daily.  3. Hytrin 5 mg p.o. q.h.s.  4. Lopressor 25 mg p.o. b.i.d.  5. Lasix 20 mg p.o. daily.  6. Aspirin  325 mg p.o. b.i.d.  7. Singulair 10 mg p.o. q.h.s.  8. Flovent one puff b.i.d.  9. Acular one drop b.i.d. in each eye.  10.      Zocor 20 mg p.o. daily.  11.      Insulin dose per Dr. [name] on December 20, 2002:  Insulin NPH 40     units subcutaneously q.a.m. and 35 units subcu q. p.m.  12.      Percocet  5/325 mg one to two q.4-6h. p.r.n. pain.  13.      Robaxin 500 mg one q.8h. p.r.n. spasm.  14.      Zofran 2 mg to 4 mg IV or p.o. q.8h. p.r.n. nausea or vomiting.    DIET:  He should be on a diet consisting of low carbohydrates and heart  healthy.  If there are any questions regarding his orthopedic care, please notify Dr.  Barbaraann Faster office at [phone].  Any questions regarding his  medical care will be addressed by notifying Dr.  Ferd Hibbs office.   CONDITION ON DISCHARGE:  Stable.      Wende Neighbors, P.A.                    Kerrin Champagne, M.D.    SMV/MEDQ  D:  12/20/2002  T:  12/20/2002  Job:  [id number]

## 2010-08-24 NOTE — Procedures (Signed)
NAME:  KHY, PITRE NO.:  [id number]   MEDICAL RECORD NO.:  [id number]          PATIENT TYPE:  OUT   LOCATION:  SLEEP CENTER                 FACILITY:  [facility]   PHYSICIAN:  Marcelyn Bruins, M.D. [facility] DATE OF BIRTH:  1927/02/11   DATE OF STUDY:  02/26/2004                              NOCTURNAL POLYSOMNOGRAM   REFERRING PHYSICIAN:  Dr. Shan Levans.   INDICATION FOR THE STUDY:  Hypersomnia with sleep apnea.  Epworth sleepiness  score is 9.   SLEEP ARCHITECTURE:  The patient had a total sleep time of 347 minutes with  a sleep efficiency of 94%.  There was significantly decreased slow wave  sleep and some decrease in REM.  Sleep latency and REM latency were normal.   IMPRESSION:  1.  Very severe obstructive sleep apnea with a respiratory disturbance index      of 97 events per hour and oxygen desaturation as low as 73%.  The events      were not positional.  2.  Very loud snoring noted throughout the study.  3.  No clinically significant cardiac arrhythmia.      KC/MEDQ  D:  02/29/2004 11:59:26  T:  02/29/2004 13:57:42  Job:  [id number]

## 2010-08-24 NOTE — H&P (Signed)
NAME:  ZUHAYR, DEENEY                        ACCOUNT NO.:  [id number]   MEDICAL RECORD NO.:  [id number]                   PATIENT TYPE:  INP   LOCATION:  0101                                 FACILITY:  [facility]   PHYSICIAN:  [name], M.D.                 DATE OF BIRTH:  10-07-1926   DATE OF ADMISSION:  12/15/2002  DATE OF DISCHARGE:                                HISTORY & PHYSICAL   CHIEF COMPLAINT:  Right ankle pain and left shoulder pain.   HISTORY OF PRESENT ILLNESS:  The patient is a very pleasant 77 year old  white male who was brought to the emergency room at [facility] via  EMS today after falling on wet grass at his home.  He reports that he was  walking to the mailbox and slipped on some wet grass, landing on his right  side.  He had the immediately onset of pain at temperature right ankle with  inability to ambulate or weight bear on the right ankle.  He also noted  obvious deformity of the right ankle.  His wife called EMS, and he was  transferred for evaluation at the ER.  X-rays in the emergency room revealed  a right ankle fracture dislocation.  He was also complaining of left  shoulder pain, which subsided gradually during the emergency room visit.  X-  rays of the left shoulder were negative for fracture.   On examination in the emergency room, the patient had obvious deformity of  the right ankle.  He had pulse palpable at the dorsalis pedis.  Sensation  was intact of his toes.  He had sluggish capillary refill of the right toes;  however, he was able to move his toes.  He underwent closed reduction under  hematoma block, as well as IV sedation in the emergency room.  Post-  reduction films showed bimalleolar fracture of the right ankle.  It was felt  the patient would require surgical intervention.  Due to his significant  medical history, it was felt he would require a preoperative medical  clearance consult by Dr. [name], his primary care  physician.  The patient is  being admitted at this time to undergo medical evaluation for medical  clearance, and then to be taken to the operating room for open reduction and  internal fixation of the right bimalleolar ankle fracture.   CURRENT MEDICATIONS:  1. Insulin 70/30, 55 units before dinner at around 5 o'clock, and 105 units     q.a.m. at breakfast.  2. Hytrin 5 mg p.o. q.h.s.  3. Lasix 40 mg p.o. daily.  4. Lopressor 50 mg one-half tablet p.o. b.i.d.  5. Enteric-coated aspirin 81 mg daily.  6. Simvastatin 40 mg one-half tablet q.h.s.  7. Nitrostat 0.4 mg as needed for chest pain.  8. Albuterol two puffs p.r.n.  9. Singulair 10 mg p.o. q.h.s.  10.  Qvar 40 mcg four puffs b.i.d.  11.      Cozaar 50 mg daily.  12.      The patient recently stopped taking Neurontin, and was able to     discontinue this on a tapering basis.  13.      He states he has been prescribed Protonix, but is not taking this     secondary to the cost.  14.      He has recently been placed on Acular eye drops.   ALLERGIES:  The patient reports IMIPRAMINE causes hallucinations.   PAST MEDICAL HISTORY:  1. Type 2 diabetes mellitus.  2. Asthma.  3. COPD.  4. Polycythemia vera.  5. Peripheral diabetic neuropathy.  6. Benign prostatic hypertrophy.  7. Peptic ulcer disease.  8. Hypertension.  9. Coronary artery disease status post coronary artery bypass graft x5 in     1996.  10.      Cataract surgery, right eye in 2001; left eye in 2004.  11.      Glaucoma.  12.      Diabetic retinopathy.  13.      Degenerative disk disease of the lumbar spine and spondylosis of     the cervical spine.  14.      Severe degenerative disk disease of the lumbar spine.  15.      Chronic renal insufficiency.  16.      Gastroesophageal reflux disease.   PAST SURGICAL HISTORY:  1. Significant for coronary artery bypass graft in 1996.  2. Three level decompressive lumbar laminectomy in 1996.  3. Tonsillectomy in  1953.  4. Cataract removal, right eye in 2001; left eye in 2004.   PRIMARY CARE PHYSICIAN:  [name], M.D.   HEMATOLOGIST:  Rose Phi. Myna Hidalgo, M.D.   CARDIOLOGIST:  Georga Hacking, M.D.   OPHTHALMOLOGIST:  Chalmers Guest, M.D.   UROLOGIST:  Excell Seltzer. Annabell Howells, M.D.   PULMONOLOGIST:  Shan Levans, M.D. [facility].   SOCIAL HISTORY:  The patient lives with his wife.  He has adult children who  live close.  He has no intake of tobacco or alcohol.  He is a retired  Optician, dispensing.   FAMILY HISTORY:  Significant for both parents living greater than 92 years  of age, both with coronary artery disease.   REVIEW OF SYSTEMS:  CNS:  The patient denies blurred vision, double vision,  seizure disorder, headaches, or paralysis.  CARDIORESPIRATORY:  No current  chest pain or shortness of breath.  He does have difficulty with long  distances as far as walking; however, he relates this to his back pain, as  well as some exertional shortness of breath.  No cough or sputum production  of hemoptysis.  GU/GI:  He does have a history of peptic ulcer disease, and  also has gastroesophageal reflux disease.  Currently, he is not using any  medications for this.  He relates that the Protonix which was prescribed is  too costly, and he is unable to afford the medication.  He has no nausea,  vomiting, diarrhea, or constipation.  He has no dysuria, hematuria, melena,  or bloody stools present.  MUSCULOSKELETAL:  Significant degenerative disk  disease of the lumbar spine, and arthritis of the cervical spine.  HEMATOLOGIC:  The patient has a history of polycythemia vera, and has had  previous phlebotomies, with the last one being in June of 2004.  His  hematologist is Dr. Myna Hidalgo.  He reports that his last hematocrit was  44%.  No history of other bleeding disorders or blood clots.  No history of  pulmonary embolus.  PHYSICAL EXAMINATION:  VITAL SIGNS:  Temperature 97.7, blood pressure  150/47, pulse 75 and regular.   The patient is an obese white male, alert and  oriented x3.  GENERAL:  He is in moderate distress, supine on a stretcher in t4he  examining room.  HEENT:  Normocephalic and atraumatic.  Pupils equal, round and reactive to  light.  Extraocular movements intact.  Nose without drainage.  Oropharynx  without edema or erythema.  NECK:  Supple.  No adenopathy or thyromegaly.  LUNGS:  Clear to auscultation anteriorly.  HEART:  Regular rate and rhythm.  Sounds are somewhat distant.  No murmur is  heard.  ABDOMEN:  Soft, nontender.  Bowel sounds are present.  GENITAL/RECTAL/BREAST:  Not performed.  Not pertinent to present illness.  EXTREMITIES:  Right ankle with obvious dislocation deformity.  He has pulse  palpated at dorsalis pedis on the right; however, posterior tibial pulse is  not palpated.  He does have sensation throughout the foot and toes.  He has  mildly sluggish capillary refill to the right toes.  He has moderate edema  about the right ankle, and also in the pretibial area on the right.  The  left lower extremity also has some edema; however, not as severe as the  right.  Examination of his left shoulder reveals passive range of motion to  be tolerated well.  It is nontender to palpation.  He has no ecchymosis or  edema about the left shoulder.  Distal pulses are intact in both upper  extremities.   IMPRESSION:  1. Right ankle bimalleolar fracture dislocation.  2. Sprain of the left shoulder.  3. Type 2 diabetes mellitus.  4. Hypertension.  5. Coronary artery disease status post coronary artery bypass graft in 1996.  6. Chronic obstructive pulmonary disease and asthma.  7. Benign prostatic hypertrophy.  8. Gastroesophageal reflux disease/history of peptic ulcer disease.  9. Peripheral neuropathy.  10.      Polycythemia vera.  11.      Severe degenerative disk disease of the lumbar spine with chronic     low back pain.  Status post lumbar decompression in 1996.  12.       Chronic renal insufficiency.   PLAN:  The patient is being admitted to undergo open reduction and internal  fixation of his right ankle fracture.  He will be seen by Dr. [name] for  medical clearance prior to undergoing surgical intervention.     Wende Neighbors, P.A.                    [name], M.D.    SMV/MEDQ  D:  12/15/2002  T:  12/15/2002  Job:  [id number]

## 2010-11-15 ENCOUNTER — Other Ambulatory Visit: Payer: Self-pay | Admitting: Hematology & Oncology

## 2010-11-15 ENCOUNTER — Encounter (HOSPITAL_BASED_OUTPATIENT_CLINIC_OR_DEPARTMENT_OTHER): Payer: Medicare Other | Admitting: Hematology & Oncology

## 2010-11-15 DIAGNOSIS — D45 Polycythemia vera: Secondary | ICD-10-CM

## 2010-11-15 DIAGNOSIS — Z7982 Long term (current) use of aspirin: Secondary | ICD-10-CM

## 2010-11-15 DIAGNOSIS — E119 Type 2 diabetes mellitus without complications: Secondary | ICD-10-CM

## 2010-11-15 LAB — CBC WITH DIFFERENTIAL (CANCER CENTER ONLY)
BASO#: 0 10*3/uL (ref 0.0–0.2)
HCT: 46.2 % (ref 38.7–49.9)
HGB: 16.2 g/dL (ref 13.0–17.1)
LYMPH#: 1.4 10*3/uL (ref 0.9–3.3)
MCHC: 35.1 g/dL (ref 32.0–35.9)
MCV: 88 fL (ref 82–98)
MONO#: 0.7 10*3/uL (ref 0.1–0.9)
NEUT%: 69.2 % (ref 40.0–80.0)
WBC: 7.2 10*3/uL (ref 4.0–10.0)

## 2010-11-15 LAB — BASIC METABOLIC PANEL - CANCER CENTER ONLY
CO2: 28 mEq/L (ref 18–33)
Glucose, Bld: 99 mg/dL (ref 73–118)
Potassium: 4.1 mEq/L (ref 3.3–4.7)
Sodium: 137 mEq/L (ref 128–145)

## 2010-12-04 ENCOUNTER — Encounter: Payer: Self-pay | Admitting: Critical Care Medicine

## 2010-12-04 ENCOUNTER — Ambulatory Visit (INDEPENDENT_AMBULATORY_CARE_PROVIDER_SITE_OTHER): Payer: Medicare Other | Admitting: Critical Care Medicine

## 2010-12-04 VITALS — BP 122/78 | HR 81 | Temp 98.3°F | Ht 67.5 in | Wt 230.4 lb

## 2010-12-04 DIAGNOSIS — D45 Polycythemia vera: Secondary | ICD-10-CM | POA: Insufficient documentation

## 2010-12-04 DIAGNOSIS — J45909 Unspecified asthma, uncomplicated: Secondary | ICD-10-CM

## 2010-12-04 NOTE — Patient Instructions (Signed)
No change in medications. Return in         4 months 

## 2010-12-04 NOTE — Progress Notes (Signed)
Subjective:    Patient ID: Donald Kaiser, male    DOB: 10-Jul-1926, 77 y.o.   MRN: [id number]  HPI 77 y.o.WM 12/04/2010 Min mucus. No new issues.  Occ wheezing Pt denies any significant sore throat, nasal congestion or excess secretions, fever, chills, sweats, unintended weight loss, pleurtic or exertional chest pain, orthopnea PND, or leg swelling Pt denies any increase in rescue therapy over baseline, denies waking up needing it or having any early am or nocturnal exacerbations of coughing/wheezing/or dyspnea. Pt also denies any obvious fluctuation in symptoms with  weather or environmental change or other alleviating or aggravating factors   Asthma History     12/04/10 1404         Asthma History:   Symptoms  0-2 days/week         Nighttime Awakenings  0-2/month         Asthma interference with normal activity  Some limitations         SABA use (not for EIB)  0-2 days/wk         Risk: Exacerbations requiring oral systemic steroids  0-1 / year           Review of Systems Constitutional:   No  weight loss, night sweats,  Fevers, chills, fatigue, lassitude. HEENT:   No headaches,  Difficulty swallowing,  Tooth/dental problems,  Sore throat,                No sneezing, itching, ear ache, nasal congestion, post nasal drip,   CV:  No chest pain,  Orthopnea, PND, swelling in lower extremities, anasarca, dizziness, palpitations  GI  No heartburn, indigestion, abdominal pain, nausea, vomiting, diarrhea, change in bowel habits, loss of appetite  Resp: Notes  shortness of breath with exertion not  at rest.  No excess mucus, no productive cough,  No non-productive cough,  No coughing up of blood.  No change in color of mucus.  No wheezing.  No chest wall deformity  Skin: no rash or lesions.  GU: no dysuria, change in color of urine, no urgency or frequency.  No flank pain.  MS:  No joint pain or swelling.  No decreased range of motion.  No back pain.  Psych:  No change in mood or  affect. No depression or anxiety.  No memory loss. w    Objective:   Physical Exam  Filed Vitals:   12/04/10 1349  BP: 122/78  Pulse: 81  Temp: 98.3 F (36.8 C)  TempSrc: Oral  Height: 5' 7.5" (1.715 m)  Weight: 230 lb 6.4 oz (104.509 kg)  SpO2: 97%    Gen: Pleasant, well-nourished, in no distress,  normal affect  ENT: No lesions,  mouth clear,  oropharynx clear, no postnasal drip  Neck: No JVD, no TMG, no carotid bruits  Lungs: No use of accessory muscles, no dullness to percussion, distant BS  Cardiovascular: RRR, heart sounds normal, no murmur or gallops, no peripheral edema  Abdomen: soft and NT, no HSM,  BS normal  Musculoskeletal: No deformities, no cyanosis or clubbing  Neuro: alert, non focal  Skin: Warm, no lesions or rashes       Assessment & Plan:   ASTHMA Stable moderate persistent asthma Plan No change in inhaled or maintenance medications. Return in  4  months     Updated Medication List Outpatient Encounter Prescriptions as of 12/04/2010  Medication Sig Dispense Refill  . albuterol-ipratropium (COMBIVENT) 18-103 MCG/ACT inhaler Inhale 1-2 puffs into the lungs every 4 (  four) hours as needed.        [name] aspirin 81 MG tablet Take 81 mg by mouth daily.        . benzonatate (TESSALON) 100 MG capsule 1-2 every 6-8 hours as needed       . dextromethorphan (DELSYM) 30 MG/5ML liquid 2 tsp every 12 hr as needed       . Ergocalciferol (VITAMIN D2) 2000 UNITS TABS Take 1 tablet by mouth daily.        [name] exenatide (BYETTA) 10 MCG/0.04ML SOLN Inject into the skin daily.        . furosemide (LASIX) 40 MG tablet Take 20 mg by mouth daily.        . insulin NPH-insulin regular (NOVOLIN 70/30) (70-30) 100 UNIT/ML injection Inject into the skin as directed.       [name] losartan (COZAAR) 50 MG tablet Take 25 mg by mouth daily.        . metoprolol (LOPRESSOR) 50 MG tablet Take 25 mg by mouth daily.        . mometasone (ASMANEX 120 METERED DOSES) 220 MCG/INH inhaler  Inhale 2 puffs into the lungs daily.        . mometasone (NASONEX) 50 MCG/ACT nasal spray Place 2 sprays into the nose daily as needed.        . montelukast (SINGULAIR) 10 MG tablet Take 10 mg by mouth daily.        . Multiple Vitamins-Minerals (PRESERVISION AREDS PO) Take 1 capsule by mouth 2 (two) times daily.        . nitroGLYCERIN (NITRODUR - DOSED IN MG/24 HR) 0.4 mg/hr Place 1 patch onto the skin as needed.        . simvastatin (ZOCOR) 40 MG tablet Take 40 mg by mouth at bedtime.        . Tamsulosin HCl (FLOMAX) 0.4 MG CAPS Take 0.4 mg by mouth daily.        . traMADol (ULTRAM) 50 MG tablet Take 50 mg by mouth every 4 (four) hours as needed.        . travoprost, benzalkonium, (TRAVATAN) 0.004 % ophthalmic solution 1 drop.        . VOLTAREN 1 % GEL Apply as directed four times daily

## 2010-12-05 NOTE — Assessment & Plan Note (Addendum)
Stable moderate persistent asthma Plan No change in inhaled or maintenance medications. Return in  4 months 

## 2011-03-20 ENCOUNTER — Other Ambulatory Visit (HOSPITAL_BASED_OUTPATIENT_CLINIC_OR_DEPARTMENT_OTHER): Payer: Medicare Other | Admitting: Lab

## 2011-03-20 ENCOUNTER — Other Ambulatory Visit: Payer: Self-pay | Admitting: Family

## 2011-03-20 ENCOUNTER — Ambulatory Visit (HOSPITAL_BASED_OUTPATIENT_CLINIC_OR_DEPARTMENT_OTHER): Payer: Medicare Other | Admitting: Hematology & Oncology

## 2011-03-20 ENCOUNTER — Ambulatory Visit (HOSPITAL_BASED_OUTPATIENT_CLINIC_OR_DEPARTMENT_OTHER): Payer: Medicare Other

## 2011-03-20 VITALS — BP 125/77 | HR 77 | Temp 96.0°F | Ht 67.5 in | Wt 229.0 lb

## 2011-03-20 DIAGNOSIS — Z7982 Long term (current) use of aspirin: Secondary | ICD-10-CM

## 2011-03-20 DIAGNOSIS — D45 Polycythemia vera: Secondary | ICD-10-CM

## 2011-03-20 LAB — CBC WITH DIFFERENTIAL (CANCER CENTER ONLY)
BASO#: 0 10*3/uL (ref 0.0–0.2)
Eosinophils Absolute: 0.1 10*3/uL (ref 0.0–0.5)
HCT: 48.8 % (ref 38.7–49.9)
HGB: 16.6 g/dL (ref 13.0–17.1)
LYMPH%: 14.1 % (ref 14.0–48.0)
MCV: 89 fL (ref 82–98)
MONO#: 0.6 10*3/uL (ref 0.1–0.9)
NEUT%: 77.4 % (ref 40.0–80.0)
Platelets: 103 10*3/uL — ABNORMAL LOW (ref 145–400)
RBC: 5.48 10*6/uL (ref 4.20–5.70)
WBC: 9.2 10*3/uL (ref 4.0–10.0)

## 2011-03-20 LAB — FERRITIN: Ferritin: 32 ng/mL (ref 22–322)

## 2011-03-20 NOTE — Progress Notes (Signed)
This office note has been dictated.

## 2011-03-20 NOTE — Progress Notes (Signed)
Donald Kaiser presents today for phlebotomy per MD orders. Phlebotomy procedure started at 1130 and ended at 1145. 500 grams removed. Patient observed post procedure Patient fainted post procedure, vital sign taken fluid given, and blood sugar check was 123. Pt place on 6 litre of oxygen nasal cannula. Pt did recovery from fainting.  Will continue to monitor patient for the next 30 min.IV needle removed intact. Second IV started on pt to give fluid. Pt received 250 ml of NS per M.D order.  BP 135/77 P 75  RR 16

## 2011-03-20 NOTE — Progress Notes (Signed)
03/20/2011 3:02 PM C/o low blood sugar, Coke given, BS 113, states feels better after drinking COKE.Donald Kaiser 3:18 PM Seen by Colman Cater NP, OK for discharge. [name], Vinod Mikesell Kaiser  3:30pm Assisted to car via wheelchair without incident. [name], Sharmila Wrobleski [employer]

## 2011-03-21 NOTE — Progress Notes (Signed)
CC:   Donald Pounds, MD Donald Kaiser, M.D.  DIAGNOSES: 1. Polycythemia vera. 2. Insulin-dependent diabetes. 3. Severe chronic low-back pain.  INTERIM HISTORY:  Donald Kaiser comes in for follow-up.  He is actually doing fairly well.  We last saw him back in August.  His last phlebotomy was back in August.  He has had no problems with fever, sweats or chills.  He has had no nausea or vomiting.  He has had no change in bowel or bladder habits.  PHYSICAL EXAMINATION:  General Appearance:  This is a modestly obese white gentleman in no obvious distress.  Vital Signs:  96, pulse 77, respiratory rate 18, blood pressure 125/75.  Weight is 229.  Head and Neck Exam:  Shows a normocephalic, atraumatic skull.  There are no ocular or oral lesions.  There are no palpable cervical or supraclavicular lymph nodes.  Lungs:  Clear bilaterally.  Cardiac Exam: Regular rate and rhythm with a normal S1 and S2.  There are no murmurs, rubs or bruits.  Abdominal Exam:  Soft with good bowel sounds.  There is no palpable abdominal mass.  He is modestly obese.  He has no fluid wave.  There is no palpable hepatosplenomegaly.  Back Exam:  No tenderness over the spine, ribs or hips.  Extremities:  Show some trace nonpitting edema in his lower legs.  Skin Exam:  No rashes, ecchymosis, or petechia.  Neurological Exam:  No focal neurological deficits.  LABORATORY STUDIES:  White cell count is 9.2, hemoglobin 16.6, hematocrit 48.8, platelet count is 103.  IMPRESSION:  Donald Kaiser is a 77 year old gentleman with polycythemia.  He is doing well with this.  He has had no complications from the polycythemia to date.  We will go ahead and phlebotomize him today.  He typically goes 3-4 months between phlebotomies.  We will go ahead and plan to get him back to see [place] in March or April.    ______________________________ Donald Kaiser, M.D. PRE/MEDQ  D:  03/20/2011  T:  03/21/2011  Job:  [id number]

## 2011-03-28 ENCOUNTER — Encounter: Payer: Self-pay | Admitting: Critical Care Medicine

## 2011-03-28 ENCOUNTER — Ambulatory Visit (INDEPENDENT_AMBULATORY_CARE_PROVIDER_SITE_OTHER): Payer: Medicare Other | Admitting: Critical Care Medicine

## 2011-03-28 DIAGNOSIS — J45909 Unspecified asthma, uncomplicated: Secondary | ICD-10-CM

## 2011-03-28 NOTE — Patient Instructions (Signed)
No change in medications. Return in              4 months Dr Myna Hidalgo will discuss your treatment plan of the phlebotomies

## 2011-03-28 NOTE — Assessment & Plan Note (Signed)
No major issues with asthma at this time Biggest issue is syncope and hypotension with relative brady cardia due to Beta blocker use during phlebotomies with P Vera Plan Case discussed with Dr Myna Hidalgo re: possibly slowing frequency of phlebotomies and accepting higher Hct threshold No change in inhaled meds

## 2011-03-28 NOTE — Progress Notes (Signed)
Subjective:    Patient ID: Donald Kaiser, male    DOB: 1926/08/28, 77 y.o.   MRN: [id number]  HPI  77 y.o.WM Asthma hx.  03/28/2011 Pt passes out with phlebotomy with PVera.   BP falls with phlebotomy.   No other issues. Pt denies any significant sore throat, nasal congestion or excess secretions, fever, chills, sweats, unintended weight loss, pleurtic or exertional chest pain, orthopnea PND, or leg swelling Pt denies any increase in rescue therapy over baseline, denies waking up needing it or having any early am or nocturnal exacerbations of coughing/wheezing/or dyspnea. Pt also denies any obvious fluctuation in symptoms with  weather or environmental change or other alleviating or aggravating factors  Past Medical History  Diagnosis Date  . OSA (obstructive sleep apnea)   . GERD (gastroesophageal reflux disease)   . Diabetes mellitus type II   . Asthma   . DVT (deep venous thrombosis)     chronic  . Post-phlebitic syndrome   . Polycythemia vera     Ennever     Family History  Problem Relation Age of Onset  . Emphysema Sister   . Allergies Sister   . Asthma Sister   . Heart disease Brother   . Heart disease Brother   . Rheum arthritis Sister   . Rheum arthritis Mother   . Cancer Mother   . Cancer Sister   . Kidney failure Father   . Stroke Sister      History   Social History  . Marital Status: Married    Spouse Name: N/A    Number of Children: N/A  . Years of Education: N/A   Occupational History  . Retired     Optician, dispensing   Social History Main Topics  . Smoking status: Never Smoker   . Smokeless tobacco: Never Used  . Alcohol Use: Not on file  . Drug Use: Not on file  . Sexually Active: Not on file   Other Topics Concern  . Not on file   Social History Narrative  . No narrative on file     No Known Allergies   Outpatient Prescriptions Prior to Visit  Medication Sig Dispense Refill  . albuterol-ipratropium (COMBIVENT) 18-103 MCG/ACT inhaler  Inhale 1-2 puffs into the lungs every 4 (four) hours as needed.        [name] aspirin 81 MG tablet Take 81 mg by mouth daily.        . benzonatate (TESSALON) 100 MG capsule 1-2 every 6-8 hours as needed       . dextromethorphan (DELSYM) 30 MG/5ML liquid 2 tsp every 12 hr as needed       . Ergocalciferol (VITAMIN D2) 2000 UNITS TABS Take 1 tablet by mouth daily.        [name] exenatide (BYETTA) 10 MCG/0.04ML SOLN Inject into the skin 2 (two) times daily.       . furosemide (LASIX) 40 MG tablet Take 20 mg by mouth daily.        . insulin NPH-insulin regular (NOVOLIN 70/30) (70-30) 100 UNIT/ML injection Inject 35 Units into the skin daily with breakfast. 33 units in the morning And 9 units in evening      . losartan (COZAAR) 50 MG tablet Take 25 mg by mouth daily.        . metoprolol (LOPRESSOR) 50 MG tablet Take 25 mg by mouth daily.        . mometasone (ASMANEX 120 METERED DOSES) 220 MCG/INH inhaler Inhale 2 puffs into  the lungs daily.        . mometasone (NASONEX) 50 MCG/ACT nasal spray Place 2 sprays into the nose daily as needed.        . montelukast (SINGULAIR) 10 MG tablet Take 10 mg by mouth daily.        . Multiple Vitamins-Minerals (PRESERVISION AREDS PO) Take 1 capsule by mouth 2 (two) times daily.        . simvastatin (ZOCOR) 40 MG tablet Take 40 mg by mouth at bedtime.        . traMADol (ULTRAM) 50 MG tablet Take 50 mg by mouth every 4 (four) hours as needed.        . travoprost, benzalkonium, (TRAVATAN) 0.004 % ophthalmic solution 1 drop.        . insulin NPH-insulin regular (NOVOLIN 70/30) (70-30) 100 UNIT/ML injection Inject into the skin as directed.       . nitroGLYCERIN (NITRODUR - DOSED IN MG/24 HR) 0.4 mg/hr Place 1 patch onto the skin as needed.       . VOLTAREN 1 % GEL Apply as directed four times daily      . Tamsulosin HCl (FLOMAX) 0.4 MG CAPS Take 0.4 mg by mouth daily.           Review of Systems  Constitutional:   No  weight loss, night sweats,  Fevers, chills, fatigue,  lassitude. HEENT:   No headaches,  Difficulty swallowing,  Tooth/dental problems,  Sore throat,                No sneezing, itching, ear ache, nasal congestion, post nasal drip,   CV:  No chest pain,  Orthopnea, PND, swelling in lower extremities, anasarca, dizziness, palpitations  GI  No heartburn, indigestion, abdominal pain, nausea, vomiting, diarrhea, change in bowel habits, loss of appetite  Resp: Notes  shortness of breath with exertion not  at rest.  No excess mucus, no productive cough,  No non-productive cough,  No coughing up of blood.  No change in color of mucus.  No wheezing.  No chest wall deformity  Skin: no rash or lesions.  GU: no dysuria, change in color of urine, no urgency or frequency.  No flank pain.  MS:  No joint pain or swelling.  No decreased range of motion.  No back pain.  Psych:  No change in mood or affect. No depression or anxiety.  No memory loss. w    Objective:   Physical Exam   Filed Vitals:   03/28/11 1030  BP: 90/60  Pulse: 87  Temp: 97.9 F (36.6 C)  TempSrc: Oral  Height: 5\' 7"  (1.702 m)  Weight: 230 lb (104.327 kg)  SpO2: 97%    Gen: Pleasant, well-nourished, in no distress,  normal affect  ENT: No lesions,  mouth clear,  oropharynx clear, no postnasal drip  Neck: No JVD, no TMG, no carotid bruits  Lungs: No use of accessory muscles, no dullness to percussion, distant BS  Cardiovascular: RRR, heart sounds normal, no murmur or gallops, no peripheral edema  Abdomen: soft and NT, no HSM,  BS normal  Musculoskeletal: No deformities, no cyanosis or clubbing  Neuro: alert, non focal  Skin: Warm, no lesions or rashes       Assessment & Plan:   ASTHMA No major issues with asthma at this time Biggest issue is syncope and hypotension with relative brady cardia due to Beta blocker use during phlebotomies with P Vera Plan Case discussed with Dr Myna Hidalgo re:  possibly slowing frequency of phlebotomies and accepting higher Hct  threshold No change in inhaled meds      Updated Medication List Outpatient Encounter Prescriptions as of 03/28/2011  Medication Sig Dispense Refill  . albuterol-ipratropium (COMBIVENT) 18-103 MCG/ACT inhaler Inhale 1-2 puffs into the lungs every 4 (four) hours as needed.        [name] aspirin 81 MG tablet Take 81 mg by mouth daily.        . benzonatate (TESSALON) 100 MG capsule 1-2 every 6-8 hours as needed       . dextromethorphan (DELSYM) 30 MG/5ML liquid 2 tsp every 12 hr as needed       . Ergocalciferol (VITAMIN D2) 2000 UNITS TABS Take 1 tablet by mouth daily.        [name] exenatide (BYETTA) 10 MCG/0.04ML SOLN Inject into the skin 2 (two) times daily.       . furosemide (LASIX) 40 MG tablet Take 20 mg by mouth daily.        . insulin NPH-insulin regular (NOVOLIN 70/30) (70-30) 100 UNIT/ML injection Inject 35 Units into the skin daily with breakfast. 33 units in the morning And 9 units in evening      . losartan (COZAAR) 50 MG tablet Take 25 mg by mouth daily.        . metoprolol (LOPRESSOR) 50 MG tablet Take 25 mg by mouth daily.        . mometasone (ASMANEX 120 METERED DOSES) 220 MCG/INH inhaler Inhale 2 puffs into the lungs daily.        . mometasone (NASONEX) 50 MCG/ACT nasal spray Place 2 sprays into the nose daily as needed.        . montelukast (SINGULAIR) 10 MG tablet Take 10 mg by mouth daily.        . Multiple Vitamins-Minerals (PRESERVISION AREDS PO) Take 1 capsule by mouth 2 (two) times daily.        . nitroGLYCERIN (NITROSTAT) 0.4 MG SL tablet Place 0.4 mg under the tongue every 5 (five) minutes as needed.        . silodosin (RAPAFLO) 8 MG CAPS capsule Take 8 mg by mouth at bedtime.        . simvastatin (ZOCOR) 40 MG tablet Take 40 mg by mouth at bedtime.        . traMADol (ULTRAM) 50 MG tablet Take 50 mg by mouth every 4 (four) hours as needed.        . travoprost, benzalkonium, (TRAVATAN) 0.004 % ophthalmic solution 1 drop.        [name] DISCONTD: insulin NPH-insulin regular  (NOVOLIN 70/30) (70-30) 100 UNIT/ML injection Inject into the skin as directed.       [name] DISCONTD: nitroGLYCERIN (NITRODUR - DOSED IN MG/24 HR) 0.4 mg/hr Place 1 patch onto the skin as needed.       . VOLTAREN 1 % GEL Apply as directed four times daily      . DISCONTD: cephALEXin (KEFLEX) 500 MG capsule       . DISCONTD: Tamsulosin HCl (FLOMAX) 0.4 MG CAPS Take 0.4 mg by mouth daily.

## 2011-04-06 ENCOUNTER — Emergency Department (INDEPENDENT_AMBULATORY_CARE_PROVIDER_SITE_OTHER): Payer: Medicare Other

## 2011-04-06 ENCOUNTER — Emergency Department (INDEPENDENT_AMBULATORY_CARE_PROVIDER_SITE_OTHER)
Admission: EM | Admit: 2011-04-06 | Discharge: 2011-04-06 | Disposition: A | Discharge status: Home / Self Care | Payer: Medicare Other | Source: Home / Self Care | Attending: Emergency Medicine | Admitting: Emergency Medicine

## 2011-04-06 ENCOUNTER — Emergency Department (HOSPITAL_COMMUNITY): Payer: Medicare Other

## 2011-04-06 ENCOUNTER — Encounter (HOSPITAL_COMMUNITY): Payer: Self-pay | Admitting: *Deleted

## 2011-04-06 DIAGNOSIS — R6889 Other general symptoms and signs: Secondary | ICD-10-CM

## 2011-04-06 DIAGNOSIS — J40 Bronchitis, not specified as acute or chronic: Secondary | ICD-10-CM

## 2011-04-06 DIAGNOSIS — J111 Influenza due to unidentified influenza virus with other respiratory manifestations: Secondary | ICD-10-CM

## 2011-04-06 HISTORY — DX: Chronic obstructive pulmonary disease, unspecified: J44.9

## 2011-04-06 HISTORY — DX: Essential (primary) hypertension: I10

## 2011-04-06 MED ORDER — AZITHROMYCIN 250 MG PO TABS: ORAL_TABLET | ORAL | Status: AC

## 2011-04-06 MED ORDER — BENZONATATE 200 MG PO CAPS: 200.0000 mg | ORAL_CAPSULE | Freq: Three times a day (TID) | ORAL | Status: AC | PRN

## 2011-04-06 NOTE — ED Provider Notes (Signed)
History     CSN: [id number]  Arrival date & time 04/06/11  1112   First MD Initiated Contact with Patient 04/06/11 1408      Chief Complaint  Patient presents with  . Cough  . Nasal Congestion    (Consider location/radiation/quality/duration/timing/severity/associated sxs/prior treatment) HPI Comments: Donald Kaiser is an 77 year old male who has had a two-day history of nausea without vomiting, dry cough, wheezing, shortness of breath, and rhinorrhea. His wife has a flulike syndrome. He himself has had the flu vaccine. He denies any fever or chills, although he did have a fever today at the urgent care Center. He denies any chest pain, sore throat, vomiting, abdominal pain, or diarrhea.  Patient is a 77 y.o. male presenting with cough.  Cough Associated symptoms include rhinorrhea, shortness of breath and wheezing. Pertinent negatives include no chills, no ear pain, no sore throat and no eye redness.    Past Medical History  Diagnosis Date  . OSA (obstructive sleep apnea)   . GERD (gastroesophageal reflux disease)   . Diabetes mellitus type II   . Asthma   . DVT (deep venous thrombosis)     chronic  . Post-phlebitic syndrome   . Polycythemia vera     Ennever  . COPD (chronic obstructive pulmonary disease)   . Hypertension     Past Surgical History  Procedure Date  . Tonsillectomy   . Artrial bypass     x 5  . Back surgery   . Broken leg surgery     Family History  Problem Relation Age of Onset  . Emphysema Sister   . Allergies Sister   . Asthma Sister   . Heart disease Brother   . Heart disease Brother   . Rheum arthritis Sister   . Rheum arthritis Mother   . Cancer Mother   . Cancer Sister   . Kidney failure Father   . Stroke Sister     History  Substance Use Topics  . Smoking status: Never Smoker   . Smokeless tobacco: Never Used  . Alcohol Use: No      Review of Systems  Constitutional: Negative for fever, chills and fatigue.  HENT:  Positive for rhinorrhea. Negative for ear pain, congestion, sore throat, sneezing, neck stiffness, voice change and postnasal drip.   Eyes: Negative for pain, discharge and redness.  Respiratory: Positive for cough, shortness of breath and wheezing. Negative for chest tightness.   Gastrointestinal: Positive for nausea. Negative for vomiting, abdominal pain and diarrhea.  Skin: Negative for rash.    Allergies  Review of patient's allergies indicates no known allergies.  Home Medications   Current Outpatient Rx  Name Route Sig Dispense Refill  . IPRATROPIUM-ALBUTEROL 18-103 MCG/ACT IN AERO Inhalation Inhale 1-2 puffs into the lungs every 4 (four) hours as needed.      . ASPIRIN 81 MG PO TABS Oral Take 81 mg by mouth daily.      [name] BENZONATATE 100 MG PO CAPS  1-2 every 6-8 hours as needed     . DEXTROMETHORPHAN POLISTIREX ER 30 MG/5ML PO LQCR  2 tsp every 12 hr as needed     . VITAMIN D2 2000 UNITS PO TABS Oral Take 1 tablet by mouth daily.      [name] EXENATIDE 10 MCG/0.04ML [place] SOLN Subcutaneous Inject into the skin 2 (two) times daily.     . FUROSEMIDE 40 MG PO TABS Oral Take 20 mg by mouth daily.      . INSULIN  ISOPHANE & REGULAR (70-30) 100 UNIT/ML [place] SUSP Subcutaneous Inject into the skin daily with breakfast. 33 units in the morning And 9 units in evening    . LOSARTAN POTASSIUM 50 MG PO TABS Oral Take 25 mg by mouth daily.      [name] METOPROLOL TARTRATE 50 MG PO TABS Oral Take 25 mg by mouth daily.      . MOMETASONE FUROATE 220 MCG/INH IN AEPB Inhalation Inhale 2 puffs into the lungs daily.      . MOMETASONE FUROATE 50 MCG/ACT NA SUSP Nasal Place 2 sprays into the nose daily as needed.      [name] MONTELUKAST SODIUM 10 MG PO TABS Oral Take 10 mg by mouth daily.      [name] PRESERVISION AREDS PO Oral Take 1 capsule by mouth 2 (two) times daily.      [name] SIMVASTATIN 40 MG PO TABS Oral Take 40 mg by mouth at bedtime.      . TRAMADOL HCL 50 MG PO TABS Oral Take 50 mg by mouth every 4 (four) hours as needed.       . TRAVOPROST 0.004 % OP SOLN  1 drop.      . AZITHROMYCIN 250 MG PO TABS  Take as directed. 6 tablet 0  . BENZONATATE 200 MG PO CAPS Oral Take 1 capsule (200 mg total) by mouth 3 (three) times daily as needed for cough. 30 capsule 0  . NITROGLYCERIN 0.4 MG SL SUBL Sublingual Place 0.4 mg under the tongue every 5 (five) minutes as needed.      [name] SILODOSIN 8 MG PO CAPS Oral Take 8 mg by mouth at bedtime.      . VOLTAREN 1 % TD GEL  Apply as directed four times daily      BP 132/64  Pulse 79  Temp(Src) 100.1 F (37.8 C) (Oral)  Resp 18  SpO2 100%  Physical Exam  Nursing note and vitals reviewed. Constitutional: He appears well-developed and well-nourished. No distress.  HENT:  Head: Normocephalic and atraumatic.  Right Ear: External ear normal.  Left Ear: External ear normal.  Nose: Nose normal.  Mouth/Throat: Oropharynx is clear and moist. No oropharyngeal exudate.  Eyes: Conjunctivae and EOM are normal. Pupils are equal, round, and reactive to light. Right eye exhibits no discharge. Left eye exhibits no discharge.  Neck: Normal range of motion. Neck supple.  Cardiovascular: Normal rate, regular rhythm and normal heart sounds.   Pulmonary/Chest: Effort normal and breath sounds normal. No stridor. No respiratory distress. He has no wheezes. He has no rales. He exhibits no tenderness.  Lymphadenopathy:    He has no cervical adenopathy.  Skin: Skin is warm and dry. No rash noted. He is not diaphoretic.    ED Course  Procedures (including critical care time)  Labs Reviewed - No data to display Dg Chest 2 View  04/06/2011  *RADIOLOGY REPORT*  Clinical Data: Cough, fever  CHEST - 2 VIEW  Comparison:  Report 06/15/2002 and report 12/15/2002 no images available  Findings: Cardiomediastinal silhouette is unremarkable.  The patient is status post CABG.  Mild elevation of the right hemidiaphragm.  No acute infiltrate or pulmonary edema. Degenerative changes thoracic spine.  Question  small calcified granuloma right midlung measures 4 mm.  IMPRESSION: No active disease.  Degenerative changes thoracic spine.  Status post CABG.  Original Report Authenticated By: Natasha Mead, M.D.     1. Influenza-like illness   2. Bronchitis       MDM  He has bronchitis. He was given a Z-Pak and benzonatate for the cough.         Roque Lias, MD 04/06/11 2137

## 2011-04-06 NOTE — ED Notes (Signed)
Pt with onset of cough and congestion Thursday unknown fever

## 2011-04-18 ENCOUNTER — Encounter: Payer: Self-pay | Admitting: Critical Care Medicine

## 2011-04-18 ENCOUNTER — Ambulatory Visit (INDEPENDENT_AMBULATORY_CARE_PROVIDER_SITE_OTHER): Payer: Medicare Other | Admitting: Critical Care Medicine

## 2011-04-18 DIAGNOSIS — J45909 Unspecified asthma, uncomplicated: Secondary | ICD-10-CM

## 2011-04-18 DIAGNOSIS — J111 Influenza due to unidentified influenza virus with other respiratory manifestations: Secondary | ICD-10-CM

## 2011-04-18 DIAGNOSIS — J019 Acute sinusitis, unspecified: Secondary | ICD-10-CM

## 2011-04-18 MED ORDER — AZITHROMYCIN 250 MG PO TABS: 250.0000 mg | ORAL_TABLET | Freq: Every day | ORAL | Status: AC

## 2011-04-18 MED ORDER — OSELTAMIVIR PHOSPHATE 75 MG PO CAPS: 75.0000 mg | ORAL_CAPSULE | Freq: Two times a day (BID) | ORAL | Status: AC

## 2011-04-18 NOTE — Patient Instructions (Signed)
Take tamiflu one twice daily for 5days Take azithromycin 250mg  Take two once then one daily until gone Finish the cipro No other med changes Rinse sinuses with nasal saline 2-3 times daily Return 1 month [place]

## 2011-04-18 NOTE — Assessment & Plan Note (Signed)
Acute viral syndrome ILI .  ?influenza with current symptom complex and now acute sinusitis with associated asthma flare Plan Tamiflu x 5days Zpak x 5days Finish Cipro Return 1 month

## 2011-04-18 NOTE — Progress Notes (Signed)
Subjective:    Patient ID: Donald Kaiser, male    DOB: Aug 18, 1926, 78 y.o.   MRN: [id number]  HPI  78 y.o.WM Asthma hx.  12/20 Pt passes out with phlebotomy with PVera.   BP falls with phlebotomy.   No other issues. Pt denies any significant sore throat, nasal congestion or excess secretions, fever, chills, sweats, unintended weight loss, pleurtic or exertional chest pain, orthopnea PND, or leg swelling Pt denies any increase in rescue therapy over baseline, denies waking up needing it or having any early am or nocturnal exacerbations of coughing/wheezing/or dyspnea. Pt also denies any obvious fluctuation in symptoms with  weather or environmental change or other alleviating or aggravating factors   04/18/2011 Became ill 12/27 with symptoms: ILI type.  Fatigue, ache all over, fever 101., noted more cough, no mucus, more dyspnea. Notes more wheezing.  No nasal drainage.   Did have flu vaccine.  Noted blood in urine and has been to urology. Bladder infection was Dx. A culture was done.  Past Medical History  Diagnosis Date  . OSA (obstructive sleep apnea)   . GERD (gastroesophageal reflux disease)   . Diabetes mellitus type II   . Asthma   . DVT (deep venous thrombosis)     chronic  . Post-phlebitic syndrome   . Polycythemia vera     Ennever  . COPD (chronic obstructive pulmonary disease)   . Hypertension      Family History  Problem Relation Age of Onset  . Emphysema Sister   . Allergies Sister   . Asthma Sister   . Heart disease Brother   . Heart disease Brother   . Rheum arthritis Sister   . Rheum arthritis Mother   . Cancer Mother   . Cancer Sister   . Kidney failure Father   . Stroke Sister      History   Social History  . Marital Status: Married    Spouse Name: N/A    Number of Children: N/A  . Years of Education: N/A   Occupational History  . Retired     Optician, dispensing   Social History Main Topics  . Smoking status: Never Smoker   . Smokeless tobacco:  Never Used  . Alcohol Use: No  . Drug Use: No  . Sexually Active: Not on file   Other Topics Concern  . Not on file   Social History Narrative  . No narrative on file     No Known Allergies   Outpatient Prescriptions Prior to Visit  Medication Sig Dispense Refill  . albuterol-ipratropium (COMBIVENT) 18-103 MCG/ACT inhaler Inhale 1-2 puffs into the lungs every 4 (four) hours as needed.        [name] aspirin 81 MG tablet Take 81 mg by mouth daily.        . benzonatate (TESSALON) 100 MG capsule 1-2 every 6-8 hours as needed       . dextromethorphan (DELSYM) 30 MG/5ML liquid 2 tsp every 12 hr as needed       . Ergocalciferol (VITAMIN D2) 2000 UNITS TABS Take 1 tablet by mouth daily.        [name] exenatide (BYETTA) 10 MCG/0.04ML SOLN Inject into the skin 2 (two) times daily.       . furosemide (LASIX) 40 MG tablet Take 20 mg by mouth daily.        . insulin NPH-insulin regular (NOVOLIN 70/30) (70-30) 100 UNIT/ML injection Inject into the skin daily with breakfast. 33 units in the morning  And 9 units in evening      . losartan (COZAAR) 50 MG tablet Take 25 mg by mouth daily.        . metoprolol (LOPRESSOR) 50 MG tablet Take 25 mg by mouth daily.        . mometasone (ASMANEX 120 METERED DOSES) 220 MCG/INH inhaler Inhale 2 puffs into the lungs daily.        . mometasone (NASONEX) 50 MCG/ACT nasal spray Place 2 sprays into the nose daily as needed.        . montelukast (SINGULAIR) 10 MG tablet Take 10 mg by mouth daily.        . Multiple Vitamins-Minerals (PRESERVISION AREDS PO) Take 1 capsule by mouth 2 (two) times daily.        . nitroGLYCERIN (NITROSTAT) 0.4 MG SL tablet Place 0.4 mg under the tongue every 5 (five) minutes as needed.        . simvastatin (ZOCOR) 40 MG tablet Take 40 mg by mouth at bedtime.        . traMADol (ULTRAM) 50 MG tablet Take 50 mg by mouth every 4 (four) hours as needed.        . travoprost, benzalkonium, (TRAVATAN) 0.004 % ophthalmic solution 1 drop.        . silodosin  (RAPAFLO) 8 MG CAPS capsule Take 8 mg by mouth at bedtime.        . VOLTAREN 1 % GEL Apply as directed four times daily         Review of Systems  Constitutional:   No  weight loss, night sweats,  Fevers, chills, fatigue, lassitude. HEENT:   No headaches,  Difficulty swallowing,  Tooth/dental problems,  Sore throat,                No sneezing, itching, ear ache, nasal congestion, post nasal drip,   CV:  No chest pain,  Orthopnea, PND, swelling in lower extremities, anasarca, dizziness, palpitations  GI  No heartburn, indigestion, abdominal pain, nausea, vomiting, diarrhea, change in bowel habits, loss of appetite  Resp: Notes  shortness of breath with exertion not  at rest.  No excess mucus, no productive cough,  No non-productive cough,  No coughing up of blood.  No change in color of mucus.  No wheezing.  No chest wall deformity  Skin: no rash or lesions.  GU: no dysuria, change in color of urine, no urgency or frequency.  No flank pain.  MS:  No joint pain or swelling.  No decreased range of motion.  No back pain.  Psych:  No change in mood or affect. No depression or anxiety.  No memory loss. w    Objective:   Physical Exam   Filed Vitals:   04/18/11 1512  BP: 110/72  Pulse: 77  Temp: 97.5 F (36.4 C)  TempSrc: Oral  Height: 5\' 7"  (1.702 m)  Weight: 217 lb (98.431 kg)  SpO2: 98%    Gen: Pleasant, well-nourished, in no distress,  normal affect  ENT: No lesions,  mouth clear,  oropharynx clear, +++ postnasal drip, purulent nasal secretions  Neck: No JVD, no TMG, no carotid bruits  Lungs: No use of accessory muscles, no dullness to percussion, distant BS, exp wheeze  Cardiovascular: RRR, heart sounds normal, no murmur or gallops, no peripheral edema  Abdomen: soft and NT, no HSM,  BS normal  Musculoskeletal: No deformities, no cyanosis or clubbing  Neuro: alert, non focal  Skin: Warm, no lesions or rashes  04/06/2011 *RADIOLOGY REPORT* Clinical Data:  Cough, fever CHEST - 2 VIEW Comparison: Report 06/15/2002 and report 12/15/2002 no images available Findings: Cardiomediastinal silhouette is unremarkable. The patient is status post CABG. Mild elevation of the right hemidiaphragm. No acute infiltrate or pulmonary edema. Degenerative changes thoracic spine. Question small calcified granuloma right midlung measures 4 mm. IMPRESSION: No active disease. Degenerative changes thoracic spine. Status post CABG. Original Report Authenticated By: Natasha Mead, M.D.         Assessment & Plan:   ASTHMA Acute viral syndrome ILI .  ?influenza with current symptom complex and now acute sinusitis with associated asthma flare Plan Tamiflu x 5days Zpak x 5days Finish Cipro Return 1 month     Updated Medication List Outpatient Encounter Prescriptions as of 04/18/2011  Medication Sig Dispense Refill  . albuterol-ipratropium (COMBIVENT) 18-103 MCG/ACT inhaler Inhale 1-2 puffs into the lungs every 4 (four) hours as needed.        [name] aspirin 81 MG tablet Take 81 mg by mouth daily.        . benzonatate (TESSALON) 100 MG capsule 1-2 every 6-8 hours as needed       . ciprofloxacin (CIPRO) 500 MG tablet Take 500 mg by mouth 2 (two) times daily. For 10 days      . dextromethorphan (DELSYM) 30 MG/5ML liquid 2 tsp every 12 hr as needed       . Ergocalciferol (VITAMIN D2) 2000 UNITS TABS Take 1 tablet by mouth daily.        [name] exenatide (BYETTA) 10 MCG/0.04ML SOLN Inject into the skin 2 (two) times daily.       . furosemide (LASIX) 40 MG tablet Take 20 mg by mouth daily.        . insulin NPH-insulin regular (NOVOLIN 70/30) (70-30) 100 UNIT/ML injection Inject into the skin daily with breakfast. 33 units in the morning And 9 units in evening      . losartan (COZAAR) 50 MG tablet Take 25 mg by mouth daily.        . metoprolol (LOPRESSOR) 50 MG tablet Take 25 mg by mouth daily.        . mometasone (ASMANEX 120 METERED DOSES) 220 MCG/INH inhaler Inhale 2 puffs into the  lungs daily.        . mometasone (NASONEX) 50 MCG/ACT nasal spray Place 2 sprays into the nose daily as needed.        . montelukast (SINGULAIR) 10 MG tablet Take 10 mg by mouth daily.        . Multiple Vitamins-Minerals (PRESERVISION AREDS PO) Take 1 capsule by mouth 2 (two) times daily.        . nitroGLYCERIN (NITROSTAT) 0.4 MG SL tablet Place 0.4 mg under the tongue every 5 (five) minutes as needed.        . simvastatin (ZOCOR) 40 MG tablet Take 40 mg by mouth at bedtime.        . traMADol (ULTRAM) 50 MG tablet Take 50 mg by mouth every 4 (four) hours as needed.        . travoprost, benzalkonium, (TRAVATAN) 0.004 % ophthalmic solution 1 drop.        [name] azithromycin (ZITHROMAX) 250 MG tablet Take 1 tablet (250 mg total) by mouth daily. Take two once then one daily until gone  6 each  0  . oseltamivir (TAMIFLU) 75 MG capsule Take 1 capsule (75 mg total) by mouth 2 (two) times daily.  10 capsule  0  .  DISCONTD: cephALEXin (KEFLEX) 500 MG capsule       . DISCONTD: silodosin (RAPAFLO) 8 MG CAPS capsule Take 8 mg by mouth at bedtime.        [name] DISCONTD: VOLTAREN 1 % GEL Apply as directed four times daily

## 2011-05-02 ENCOUNTER — Ambulatory Visit (INDEPENDENT_AMBULATORY_CARE_PROVIDER_SITE_OTHER): Payer: Medicare Other | Admitting: Adult Health

## 2011-05-02 ENCOUNTER — Encounter: Payer: Self-pay | Admitting: Adult Health

## 2011-05-02 VITALS — BP 122/78 | HR 90 | Temp 97.2°F | Ht 67.0 in | Wt 221.0 lb

## 2011-05-02 DIAGNOSIS — J45909 Unspecified asthma, uncomplicated: Secondary | ICD-10-CM

## 2011-05-02 MED ORDER — LEVALBUTEROL HCL 0.63 MG/3ML IN NEBU: 1.0000 | INHALATION_SOLUTION | Freq: Once | RESPIRATORY_TRACT | Status: DC

## 2011-05-02 MED ORDER — LEVALBUTEROL HCL 0.63 MG/3ML IN NEBU
0.6300 mg | INHALATION_SOLUTION | Freq: Once | RESPIRATORY_TRACT | Status: AC
  Administered 2011-05-02: 0.63 mg via RESPIRATORY_TRACT

## 2011-05-02 MED ORDER — HYDROCODONE-HOMATROPINE 5-1.5 MG/5ML PO SYRP: 5.0000 mL | ORAL_SOLUTION | Freq: Four times a day (QID) | ORAL | Status: AC | PRN

## 2011-05-02 MED ORDER — PREDNISONE 10 MG PO TABS: ORAL_TABLET | ORAL | Status: DC

## 2011-05-02 MED ORDER — BENZONATATE 100 MG PO CAPS: 100.0000 mg | ORAL_CAPSULE | Freq: Three times a day (TID) | ORAL | Status: DC | PRN

## 2011-05-02 NOTE — Progress Notes (Signed)
Addended by: Abigail Miyamoto D on: 05/02/2011 12:14 PM   Modules accepted: Orders

## 2011-05-02 NOTE — Progress Notes (Signed)
Subjective:    Patient ID: Donald Kaiser, male    DOB: [birth year], 78 y.o.   MRN: [id number]  HPI  78 y.o.WM Asthma hx.  12/20 Pt passes out with phlebotomy with PVera.   BP falls with phlebotomy.   No other issues.   04/18/11 Became ill 12/27 with symptoms: ILI type.  Fatigue, ache all over, fever 101., noted more cough, no mucus, more dyspnea. Notes more wheezing.  No nasal drainage.   Did have flu vaccine.  Noted blood in urine and has been to urology. Bladder infection was Dx. A culture was done. >>Tamiflu   05/02/2011 Acute OV  Returns for persistent symptoms with cough, and wheezing. Using tramadol and delsym with minimal help. Still feels weak. Complains of nausea and low appetite. Does not feel good.  Cough is severe at times. Keeps him up at night  Cant stop coughing . Wheezing is not going away. No fever. No discolored mucus  Cough is dry.  Takes Asmanex daily no missed doses.  No hemoptysis or edema     Past Medical History  Diagnosis Date  . OSA (obstructive sleep apnea)   . GERD (gastroesophageal reflux disease)   . Diabetes mellitus type II   . Asthma   . DVT (deep venous thrombosis)     chronic  . Post-phlebitic syndrome   . Polycythemia vera     Ennever  . COPD (chronic obstructive pulmonary disease)   . Hypertension      Family History  Problem Relation Age of Onset  . Emphysema Sister   . Allergies Sister   . Asthma Sister   . Heart disease Brother   . Heart disease Brother   . Rheum arthritis Sister   . Rheum arthritis Mother   . Cancer Mother   . Cancer Sister   . Kidney failure Father   . Stroke Sister      History   Social History  . Marital Status: Married    Spouse Name: N/A    Number of Children: N/A  . Years of Education: N/A   Occupational History  . Retired     Optician, dispensing   Social History Main Topics  . Smoking status: Never Smoker   . Smokeless tobacco: Never Used  . Alcohol Use: No  . Drug Use: No  . Sexually  Active: Not on file   Other Topics Concern  . Not on file   Social History Narrative  . No narrative on file     No Known Allergies   Outpatient Prescriptions Prior to Visit  Medication Sig Dispense Refill  . albuterol-ipratropium (COMBIVENT) 18-103 MCG/ACT inhaler Inhale 1-2 puffs into the lungs every 4 (four) hours as needed.        [name] aspirin 81 MG tablet Take 81 mg by mouth daily.        . benzonatate (TESSALON) 100 MG capsule 1-2 every 6-8 hours as needed       . dextromethorphan (DELSYM) 30 MG/5ML liquid 2 tsp every 12 hr as needed       . Ergocalciferol (VITAMIN D2) 2000 UNITS TABS Take 1 tablet by mouth daily.        [name] exenatide (BYETTA) 10 MCG/0.04ML SOLN Inject into the skin 2 (two) times daily.       . furosemide (LASIX) 40 MG tablet Take 20 mg by mouth daily.        . insulin NPH-insulin regular (NOVOLIN 70/30) (70-30) 100 UNIT/ML injection Inject into the skin  daily with breakfast. 33 units in the morning And 9 units in evening      . losartan (COZAAR) 50 MG tablet Take 25 mg by mouth daily.        . metoprolol (LOPRESSOR) 50 MG tablet Take 25 mg by mouth daily.        . mometasone (ASMANEX 120 METERED DOSES) 220 MCG/INH inhaler Inhale 2 puffs into the lungs daily.        . mometasone (NASONEX) 50 MCG/ACT nasal spray Place 2 sprays into the nose daily as needed.        . montelukast (SINGULAIR) 10 MG tablet Take 10 mg by mouth daily.        . Multiple Vitamins-Minerals (PRESERVISION AREDS PO) Take 1 capsule by mouth 2 (two) times daily.        . nitroGLYCERIN (NITROSTAT) 0.4 MG SL tablet Place 0.4 mg under the tongue every 5 (five) minutes as needed.        . simvastatin (ZOCOR) 40 MG tablet Take 40 mg by mouth at bedtime.        . traMADol (ULTRAM) 50 MG tablet Take 50 mg by mouth every 4 (four) hours as needed.        . travoprost, benzalkonium, (TRAVATAN) 0.004 % ophthalmic solution 1 drop.        . ciprofloxacin (CIPRO) 500 MG tablet Take 500 mg by mouth 2 (two) times  daily. For 10 days         Review of Systems  Constitutional:   No  weight loss, night sweats,  Fevers, chills, + fatigue, lassitude. HEENT:   No headaches,  Difficulty swallowing,  Tooth/dental problems,  Sore throat,                No sneezing, itching, ear ache, nasal congestion, post nasal drip,   CV:  No chest pain,  Orthopnea, PND, swelling in lower extremities, anasarca, dizziness, palpitations  GI  No heartburn, indigestion, abdominal pain, nausea, vomiting, diarrhea, change in bowel habits, loss of appetite  Resp: Notes  shortness of breath with exertion not  at rest.  No excess mucus, no productive cough,     No coughing up of blood.  No change in color of mucus.  No wheezing.  No chest wall deformity  Skin: no rash or lesions.  GU: no dysuria, change in color of urine, no urgency or frequency.  No flank pain.  MS:  No joint pain or swelling.  No decreased range of motion.  No back pain.  Psych:  No change in mood or affect. No depression or anxiety.  No memory loss. w    Objective:   Physical Exam   Filed Vitals:   05/02/11 1043  BP: 122/78  Pulse: 90  Temp: 97.2 F (36.2 C)  TempSrc: Oral  Height: 5\' 7"  (1.702 m)  Weight: 221 lb (100.245 kg)  SpO2: 100%    Gen: Pleasant, well-nourished, in no distress,  normal affect  ENT: No lesions,  mouth clear,  oropharynx clear,no postnasal drip  Neck: No JVD, no TMG, no carotid bruits  Lungs: No use of accessory muscles, no dullness to percussion, distant BS, exp wheeze  Cardiovascular: RRR, heart sounds normal, no murmur or gallops, no peripheral edema  Abdomen: soft and NT, no HSM,  BS normal  Musculoskeletal: No deformities, no cyanosis or clubbing  Neuro: alert, non focal  Skin: Warm, no lesions or rashes   04/06/2011 *RADIOLOGY REPORT* Clinical Data: Cough, fever CHEST -  2 VIEW Comparison: Report 06/15/2002 and report 12/15/2002 no images available Findings: Cardiomediastinal silhouette is  unremarkable. The patient is status post CABG. Mild elevation of the right hemidiaphragm. No acute infiltrate or pulmonary edema. Degenerative changes thoracic spine. Question small calcified granuloma right midlung measures 4 mm. IMPRESSION: No active disease. Degenerative changes thoracic spine. Status post CABG. Original Report Authenticated By: Natasha Mead, M.D.         Assessment & Plan:   No problem-specific assessment & plan notes found for this encounter.   Updated Medication List Outpatient Encounter Prescriptions as of 05/02/2011  Medication Sig Dispense Refill  . albuterol-ipratropium (COMBIVENT) 18-103 MCG/ACT inhaler Inhale 1-2 puffs into the lungs every 4 (four) hours as needed.        [name] aspirin 81 MG tablet Take 81 mg by mouth daily.        . benzonatate (TESSALON) 100 MG capsule 1-2 every 6-8 hours as needed       . dextromethorphan (DELSYM) 30 MG/5ML liquid 2 tsp every 12 hr as needed       . Ergocalciferol (VITAMIN D2) 2000 UNITS TABS Take 1 tablet by mouth daily.        [name] exenatide (BYETTA) 10 MCG/0.04ML SOLN Inject into the skin 2 (two) times daily.       . furosemide (LASIX) 40 MG tablet Take 20 mg by mouth daily.        . insulin NPH-insulin regular (NOVOLIN 70/30) (70-30) 100 UNIT/ML injection Inject into the skin daily with breakfast. 33 units in the morning And 9 units in evening      . losartan (COZAAR) 50 MG tablet Take 25 mg by mouth daily.        . metoprolol (LOPRESSOR) 50 MG tablet Take 25 mg by mouth daily.        . mometasone (ASMANEX 120 METERED DOSES) 220 MCG/INH inhaler Inhale 2 puffs into the lungs daily.        . mometasone (NASONEX) 50 MCG/ACT nasal spray Place 2 sprays into the nose daily as needed.        . montelukast (SINGULAIR) 10 MG tablet Take 10 mg by mouth daily.        . Multiple Vitamins-Minerals (PRESERVISION AREDS PO) Take 1 capsule by mouth 2 (two) times daily.        . nitroGLYCERIN (NITROSTAT) 0.4 MG SL tablet Place 0.4 mg under the  tongue every 5 (five) minutes as needed.        . simvastatin (ZOCOR) 40 MG tablet Take 40 mg by mouth at bedtime.        . traMADol (ULTRAM) 50 MG tablet Take 50 mg by mouth every 4 (four) hours as needed.        . travoprost, benzalkonium, (TRAVATAN) 0.004 % ophthalmic solution 1 drop.        [name] DISCONTD: ciprofloxacin (CIPRO) 500 MG tablet Take 500 mg by mouth 2 (two) times daily. For 10 days

## 2011-05-02 NOTE — Patient Instructions (Addendum)
Hold Asmanex , begin Dulera 2 puffs Twice daily  -finish sample then return back to Asmanex  Stop Tramadol  May use Hydromet 1-2 tsp every 4-6 hr As needed  Cough Prednisone taper over next week.  Fluids and rest.  May use Delsym and tessalon As needed  Cough  follow up Dr. Delford Field  In 2-3 weeks as planned and As needed   Please contact office for sooner follow up if symptoms do not improve or worsen or seek emergency care  Watch sugars closely if remain elevated >250 call us or Diabetic doctor.

## 2011-05-02 NOTE — Assessment & Plan Note (Addendum)
Slow to resolve exacerbation s/p Influenza  xopenex neb in office   Plan:  Hold Asmanex , begin Dulera 2 puffs Twice daily  -finish sample then return back to Asmanex  Stop Tramadol (may be causing nausea /dizziness)  May use Hydromet 1-2 tsp every 4-6 hr As needed  Cough Prednisone taper over next week.  Fluids and rest.  May use Delsym and tessalon As needed  Cough  follow up Dr. Delford Field  In 2-3 weeks as planned and As needed   Please contact office for sooner follow up if symptoms do not improve or worsen or seek emergency care  Watch sugars closely if remain elevated >250 call us or Diabetic doctor.

## 2011-05-20 ENCOUNTER — Ambulatory Visit (INDEPENDENT_AMBULATORY_CARE_PROVIDER_SITE_OTHER): Payer: Medicare Other | Admitting: Critical Care Medicine

## 2011-05-20 ENCOUNTER — Encounter: Payer: Self-pay | Admitting: Critical Care Medicine

## 2011-05-20 DIAGNOSIS — J45909 Unspecified asthma, uncomplicated: Secondary | ICD-10-CM

## 2011-05-20 DIAGNOSIS — E119 Type 2 diabetes mellitus without complications: Secondary | ICD-10-CM

## 2011-05-20 NOTE — Patient Instructions (Signed)
No change in medications. Return in   3 months 

## 2011-05-20 NOTE — Progress Notes (Signed)
Subjective:    Patient ID: Donald Kaiser, male    DOB: 02-06-1927, 78 y.o.   MRN: [id number]  HPI  78 y.o.WM Asthma hx.  1/24 Acute OV  Returns for persistent symptoms with cough, and wheezing. Using tramadol and delsym with minimal help. Still feels weak. Complains of nausea and low appetite. Does not feel good.  Cough is severe at times. Keeps him up at night  Cant stop coughing . Wheezing is not going away. No fever. No discolored mucus  Cough is dry.  Takes Asmanex daily no missed doses.  No hemoptysis or edema   05/20/2011 Now is much better.  Off tramadol  Rx pred/mucinex.  Used dulera and now back to [employer]. No hemoptysis.  No fever.  Dyspnea is at baseline Pt denies any significant sore throat, nasal congestion or excess secretions, fever, chills, sweats, unintended weight loss, pleurtic or exertional chest pain, orthopnea PND, or leg swelling Pt denies any increase in rescue therapy over baseline, denies waking up needing it or having any early am or nocturnal exacerbations of coughing/wheezing/or dyspnea. Pt also denies any obvious fluctuation in symptoms with  weather or environmental change or other alleviating or aggravating factors    Past Medical History  Diagnosis Date  . OSA (obstructive sleep apnea)   . GERD (gastroesophageal reflux disease)   . Diabetes mellitus type II   . Asthma   . DVT (deep venous thrombosis)     chronic  . Post-phlebitic syndrome   . Polycythemia vera     Ennever  . COPD (chronic obstructive pulmonary disease)   . Hypertension      Family History  Problem Relation Age of Onset  . Emphysema Sister   . Allergies Sister   . Asthma Sister   . Heart disease Brother   . Heart disease Brother   . Rheum arthritis Sister   . Rheum arthritis Mother   . Cancer Mother   . Cancer Sister   . Kidney failure Father   . Stroke Sister      History   Social History  . Marital Status: Married    Spouse Name: N/A    Number of  Children: N/A  . Years of Education: N/A   Occupational History  . Retired     Optician, dispensing   Social History Main Topics  . Smoking status: Never Smoker   . Smokeless tobacco: Never Used  . Alcohol Use: No  . Drug Use: No  . Sexually Active: Not on file   Other Topics Concern  . Not on file   Social History Narrative  . No narrative on file     Allergies  Allergen Reactions  . Tramadol     Dizziness, lightheadeness     Outpatient Prescriptions Prior to Visit  Medication Sig Dispense Refill  . albuterol-ipratropium (COMBIVENT) 18-103 MCG/ACT inhaler Inhale 1-2 puffs into the lungs every 4 (four) hours as needed.        [name] aspirin 81 MG tablet Take 81 mg by mouth daily.        . benzonatate (TESSALON) 100 MG capsule Take 1 capsule (100 mg total) by mouth 3 (three) times daily as needed for cough.  20 capsule  6  . dextromethorphan (DELSYM) 30 MG/5ML liquid 2 tsp every 12 hr as needed       . Ergocalciferol (VITAMIN D2) 2000 UNITS TABS Take 1 tablet by mouth daily.        [name] exenatide (BYETTA) 10 MCG/0.04ML SOLN  Inject into the skin 2 (two) times daily.       . furosemide (LASIX) 40 MG tablet Take 20 mg by mouth daily.        . insulin NPH-insulin regular (NOVOLIN 70/30) (70-30) 100 UNIT/ML injection Inject into the skin daily with breakfast. 33 units in the morning And 9 units in evening      . losartan (COZAAR) 50 MG tablet Take 25 mg by mouth daily.        . metoprolol (LOPRESSOR) 50 MG tablet Take 25 mg by mouth daily.        . mometasone (ASMANEX 120 METERED DOSES) 220 MCG/INH inhaler Inhale 2 puffs into the lungs daily.       . mometasone (NASONEX) 50 MCG/ACT nasal spray Place 2 sprays into the nose daily as needed.       . montelukast (SINGULAIR) 10 MG tablet Take 10 mg by mouth daily.        . Multiple Vitamins-Minerals (PRESERVISION AREDS PO) Take 1 capsule by mouth 2 (two) times daily.        . nitroGLYCERIN (NITROSTAT) 0.4 MG SL tablet Place 0.4 mg under the tongue every  5 (five) minutes as needed.        . simvastatin (ZOCOR) 40 MG tablet Take 40 mg by mouth at bedtime.        . travoprost, benzalkonium, (TRAVATAN) 0.004 % ophthalmic solution Place 1 drop into both eyes daily.       . traMADol (ULTRAM) 50 MG tablet Take 50 mg by mouth every 4 (four) hours as needed.        . predniSONE (DELTASONE) 10 MG tablet 4 tabs for 2 days, then 3 tabs for 2 days, 2 tabs for 2 days, then 1 tab for 2 days, then stop  20 tablet  0     Review of Systems  Constitutional:   No  weight loss, night sweats,  Fevers, chills, + fatigue, lassitude. HEENT:   No headaches,  Difficulty swallowing,  Tooth/dental problems,  Sore throat,                No sneezing, itching, ear ache, nasal congestion, post nasal drip,   CV:  No chest pain,  Orthopnea, PND, swelling in lower extremities, anasarca, dizziness, palpitations  GI  No heartburn, indigestion, abdominal pain, nausea, vomiting, diarrhea, change in bowel habits, loss of appetite  Resp: Notes  shortness of breath with exertion not  at rest.  No excess mucus, no productive cough,     No coughing up of blood.  No change in color of mucus.  No wheezing.  No chest wall deformity  Skin: no rash or lesions.  GU: no dysuria, change in color of urine, no urgency or frequency.  No flank pain.  MS:  No joint pain or swelling.  No decreased range of motion.  No back pain.  Psych:  No change in mood or affect. No depression or anxiety.  No memory loss. w    Objective:   Physical Exam   Filed Vitals:   05/20/11 1121  BP: 154/74  Pulse: 77  Temp: 97.6 F (36.4 C)  TempSrc: Oral  Height: 5' 7.5" (1.715 m)  Weight: 219 lb (99.338 kg)  SpO2: 100%    Gen: Pleasant, well-nourished, in no distress,  normal affect  ENT: No lesions,  mouth clear,  oropharynx clear,no postnasal drip  Neck: No JVD, no TMG, no carotid bruits  Lungs: No use of accessory  muscles, no dullness to percussion,  Clear   Cardiovascular: RRR, heart  sounds normal, no murmur or gallops, no peripheral edema  Abdomen: soft and NT, no HSM,  BS normal  Musculoskeletal: No deformities, no cyanosis or clubbing  Neuro: alert, non focal  Skin: Warm, no lesions or rashes   04/06/2011 *RADIOLOGY REPORT* Clinical Data: Cough, fever CHEST - 2 VIEW Comparison: Report 06/15/2002 and report 12/15/2002 no images available Findings: Cardiomediastinal silhouette is unremarkable. The patient is status post CABG. Mild elevation of the right hemidiaphragm. No acute infiltrate or pulmonary edema. Degenerative changes thoracic spine. Question small calcified granuloma right midlung measures 4 mm. IMPRESSION: No active disease. Degenerative changes thoracic spine. Status post CABG. Original Report Authenticated By: Natasha Mead, M.D.         Assessment & Plan:    ASTHMA Recent asthma flare d/t ILI flu like illness, now improved Plan Cont asmanex two puff daily No further ABX No change in inhaled or maintenance medications. Return in 3 months    Updated Medication List Outpatient Encounter Prescriptions as of 05/20/2011  Medication Sig Dispense Refill  . albuterol-ipratropium (COMBIVENT) 18-103 MCG/ACT inhaler Inhale 1-2 puffs into the lungs every 4 (four) hours as needed.        [name] aspirin 81 MG tablet Take 81 mg by mouth daily.        . benzonatate (TESSALON) 100 MG capsule Take 1 capsule (100 mg total) by mouth 3 (three) times daily as needed for cough.  20 capsule  6  . dextromethorphan (DELSYM) 30 MG/5ML liquid 2 tsp every 12 hr as needed       . Ergocalciferol (VITAMIN D2) 2000 UNITS TABS Take 1 tablet by mouth daily.        [name] exenatide (BYETTA) 10 MCG/0.04ML SOLN Inject into the skin 2 (two) times daily.       . furosemide (LASIX) 40 MG tablet Take 20 mg by mouth daily.        . insulin NPH-insulin regular (NOVOLIN 70/30) (70-30) 100 UNIT/ML injection Inject into the skin daily with breakfast. 33 units in the morning And 9 units in evening        . losartan (COZAAR) 50 MG tablet Take 25 mg by mouth daily.        . metoprolol (LOPRESSOR) 50 MG tablet Take 25 mg by mouth daily.        . mometasone (ASMANEX 120 METERED DOSES) 220 MCG/INH inhaler Inhale 2 puffs into the lungs daily.       . mometasone (NASONEX) 50 MCG/ACT nasal spray Place 2 sprays into the nose daily as needed.       . montelukast (SINGULAIR) 10 MG tablet Take 10 mg by mouth daily.        . Multiple Vitamins-Minerals (PRESERVISION AREDS PO) Take 1 capsule by mouth 2 (two) times daily.        . nitroGLYCERIN (NITROSTAT) 0.4 MG SL tablet Place 0.4 mg under the tongue every 5 (five) minutes as needed.        . simvastatin (ZOCOR) 40 MG tablet Take 40 mg by mouth at bedtime.        . travoprost, benzalkonium, (TRAVATAN) 0.004 % ophthalmic solution Place 1 drop into both eyes daily.       [name] DISCONTD: traMADol (ULTRAM) 50 MG tablet Take 50 mg by mouth every 4 (four) hours as needed.        [name] DISCONTD: predniSONE (DELTASONE) 10 MG tablet 4 tabs for 2  days, then 3 tabs for 2 days, 2 tabs for 2 days, then 1 tab for 2 days, then stop  20 tablet  0          Updated Medication List Outpatient Encounter Prescriptions as of 05/20/2011  Medication Sig Dispense Refill  . albuterol-ipratropium (COMBIVENT) 18-103 MCG/ACT inhaler Inhale 1-2 puffs into the lungs every 4 (four) hours as needed.        [name] aspirin 81 MG tablet Take 81 mg by mouth daily.        . benzonatate (TESSALON) 100 MG capsule Take 1 capsule (100 mg total) by mouth 3 (three) times daily as needed for cough.  20 capsule  6  . dextromethorphan (DELSYM) 30 MG/5ML liquid 2 tsp every 12 hr as needed       . Ergocalciferol (VITAMIN D2) 2000 UNITS TABS Take 1 tablet by mouth daily.        [name] exenatide (BYETTA) 10 MCG/0.04ML SOLN Inject into the skin 2 (two) times daily.       . furosemide (LASIX) 40 MG tablet Take 20 mg by mouth daily.        . insulin NPH-insulin regular (NOVOLIN 70/30) (70-30) 100 UNIT/ML injection Inject  into the skin daily with breakfast. 33 units in the morning And 9 units in evening      . losartan (COZAAR) 50 MG tablet Take 25 mg by mouth daily.        . metoprolol (LOPRESSOR) 50 MG tablet Take 25 mg by mouth daily.        . mometasone (ASMANEX 120 METERED DOSES) 220 MCG/INH inhaler Inhale 2 puffs into the lungs daily.       . mometasone (NASONEX) 50 MCG/ACT nasal spray Place 2 sprays into the nose daily as needed.       . montelukast (SINGULAIR) 10 MG tablet Take 10 mg by mouth daily.        . Multiple Vitamins-Minerals (PRESERVISION AREDS PO) Take 1 capsule by mouth 2 (two) times daily.        . nitroGLYCERIN (NITROSTAT) 0.4 MG SL tablet Place 0.4 mg under the tongue every 5 (five) minutes as needed.        . simvastatin (ZOCOR) 40 MG tablet Take 40 mg by mouth at bedtime.        . travoprost, benzalkonium, (TRAVATAN) 0.004 % ophthalmic solution Place 1 drop into both eyes daily.       [name] DISCONTD: traMADol (ULTRAM) 50 MG tablet Take 50 mg by mouth every 4 (four) hours as needed.        [name] DISCONTD: predniSONE (DELTASONE) 10 MG tablet 4 tabs for 2 days, then 3 tabs for 2 days, 2 tabs for 2 days, then 1 tab for 2 days, then stop  20 tablet  0

## 2011-05-20 NOTE — Assessment & Plan Note (Addendum)
Recent asthma flare d/t ILI flu like illness, now improved Plan Cont asmanex two puff daily No further ABX No change in inhaled or maintenance medications. Return in 3 months

## 2011-06-17 ENCOUNTER — Other Ambulatory Visit (HOSPITAL_BASED_OUTPATIENT_CLINIC_OR_DEPARTMENT_OTHER): Payer: Medicare Other | Admitting: Lab

## 2011-06-17 ENCOUNTER — Ambulatory Visit (HOSPITAL_BASED_OUTPATIENT_CLINIC_OR_DEPARTMENT_OTHER): Payer: Medicare Other | Admitting: Hematology & Oncology

## 2011-06-17 ENCOUNTER — Ambulatory Visit: Payer: Medicare Other | Admitting: Hematology & Oncology

## 2011-06-17 ENCOUNTER — Other Ambulatory Visit: Payer: Medicare Other | Admitting: Lab

## 2011-06-17 VITALS — BP 128/78 | HR 80 | Temp 98.2°F | Ht 67.5 in | Wt 217.0 lb

## 2011-06-17 DIAGNOSIS — D45 Polycythemia vera: Secondary | ICD-10-CM

## 2011-06-17 DIAGNOSIS — E119 Type 2 diabetes mellitus without complications: Secondary | ICD-10-CM

## 2011-06-17 LAB — CBC WITH DIFFERENTIAL (CANCER CENTER ONLY)
BASO#: 0 10*3/uL (ref 0.0–0.2)
Eosinophils Absolute: 0.1 10*3/uL (ref 0.0–0.5)
HCT: 45 % (ref 38.7–49.9)
LYMPH%: 17.4 % (ref 14.0–48.0)
MCV: 90 fL (ref 82–98)
MONO#: 0.8 10*3/uL (ref 0.1–0.9)
NEUT%: 73.6 % (ref 40.0–80.0)
RBC: 4.99 10*6/uL (ref 4.20–5.70)
WBC: 9.9 10*3/uL (ref 4.0–10.0)

## 2011-06-17 NOTE — Progress Notes (Signed)
This office note has been dictated.

## 2011-06-17 NOTE — Progress Notes (Signed)
CC:   [name], MD Georga Hacking, M.D.  DIAGNOSES: 1. Polycythemia vera. 2. Insulin-dependent diabetes.  CURRENT THERAPY:  Phlebotomy to maintain hematocrit less than 45%.  INTERIM HISTORY:  Mr. Sopko comes in for his followup.  He is doing okay.  He has lost quite a bit of weight.  He said he had the flu and pneumonia after Christmas.  His blood sugars are doing better.  His last hemoglobin A1c was 6.3.  Whenever we have phlebotomized him, he has severe vasovagal reaction. He thinks that it might be from 1 of his medications.  He says that he was on think Flomax for his prostate.  Since being taken off Flomax, he has not feel as dizzy.  He still has the chronic back discomfort.  This is not worsened on him.  He has not had any problems with bleeding. There has been no diarrhea. He has had no chest pain.  PHYSICAL EXAM:  General: This is a mildly obese white gentleman in no obvious distress.  Vital Signs: Show temperature of 98.2, pulse 80, respiratory rate 18, blood pressure 128/78, and weight is 217.  Head and neck exam shows a normocephalic, atraumatic skull.  There are no ocular or oral lesions.  There are no palpable cervical or supraclavicular lymph nodes.  Lungs are clear to percussion and auscultation bilaterally.  Cardiac examination:  Regular rhythm with a normal S1, S2. He has a 1/6 systolic ejection murmur.  Abdominal exam: Soft, mildly obese.  He has good bowel sounds.  There is no palpable abdominal mass. There is no fluid wave.  There is no palpable hepatosplenomegaly. Extremities: Shows no clubbing, cyanosis, or edema.  He does have some diabetic changes in his lower legs.  LABORATORY STUDIES:  White cell count is 9.9, hemoglobin 15, hematocrit 45, and platelet count 117.  MCV is 90.  IMPRESSION:  Mr. Rawl is an 78 year old gentleman with polycythemia vera.  He has actually done well with this.  He has not had any complications from the  polycythemia.  When we last saw him in December, his ferritin was 32.  I think we can hold off on phlebotomizing him right now.  Again, he has had these issues in the past.  Hopefully, now that he is off his Flomax, he might be better able to tolerate phlebotomy.  We will get him back in 6-week's time appeared.  We may plan for a phlebotomy at that point in time.    ______________________________ Josph Macho, M.D. PRE/MEDQ  D:  06/17/2011  T:  06/17/2011  Job:  [id number]

## 2011-07-03 ENCOUNTER — Emergency Department (INDEPENDENT_AMBULATORY_CARE_PROVIDER_SITE_OTHER)
Admission: EM | Admit: 2011-07-03 | Discharge: 2011-07-03 | Disposition: A | Discharge status: Home / Self Care | Payer: Medicare Other | Source: Home / Self Care

## 2011-07-03 ENCOUNTER — Encounter (HOSPITAL_COMMUNITY): Payer: Self-pay | Admitting: *Deleted

## 2011-07-03 DIAGNOSIS — R197 Diarrhea, unspecified: Secondary | ICD-10-CM

## 2011-07-03 NOTE — ED Provider Notes (Signed)
History     CSN: [id number]  Arrival date & time 07/03/11  0907   None     Chief Complaint  Patient presents with  . Diarrhea  . Nausea    (Consider location/radiation/quality/duration/timing/severity/associated sxs/prior treatment) HPI Comments: Patient presents with complaints of diarrhea. Diarrhea began yesterday, and he had approximately 10 watery nonbloody stools. He has had 3-4 loose stools this morning. No nausea, vomiting or abdominal pain. His wife has been ill with N/V/D x 3 days. Pt states his appetite is decreased and he is not eating but he is drinking fluids. He is not thirsty and has been urinating normally. No dizziness.    Past Medical History  Diagnosis Date  . OSA (obstructive sleep apnea)   . GERD (gastroesophageal reflux disease)   . Diabetes mellitus type II   . Asthma   . DVT (deep venous thrombosis)     chronic  . Post-phlebitic syndrome   . Polycythemia vera     Ennever  . COPD (chronic obstructive pulmonary disease)   . Hypertension     Past Surgical History  Procedure Date  . Tonsillectomy   . Artrial bypass     x 5  . Back surgery   . Broken leg surgery     Family History  Problem Relation Age of Onset  . Emphysema Sister   . Allergies Sister   . Asthma Sister   . Heart disease Brother   . Heart disease Brother   . Rheum arthritis Sister   . Rheum arthritis Mother   . Cancer Mother   . Cancer Sister   . Kidney failure Father   . Stroke Sister     History  Substance Use Topics  . Smoking status: Never Smoker   . Smokeless tobacco: Never Used  . Alcohol Use: No      Review of Systems  Constitutional: Positive for appetite change. Negative for fever and chills.  Respiratory: Negative for cough and shortness of breath.   Cardiovascular: Negative for chest pain and palpitations.  Gastrointestinal: Positive for diarrhea. Negative for nausea, vomiting and abdominal pain.  Neurological: Negative for dizziness and headaches.      Allergies  Tramadol  Home Medications   Current Outpatient Rx  Name Route Sig Dispense Refill  . IPRATROPIUM-ALBUTEROL 18-103 MCG/ACT IN AERO Inhalation Inhale 1-2 puffs into the lungs every 4 (four) hours as needed.      . ASPIRIN 81 MG PO TABS Oral Take 81 mg by mouth daily.      [name] BENZONATATE 100 MG PO CAPS Oral Take 1 capsule (100 mg total) by mouth 3 (three) times daily as needed for cough. 20 capsule 6  . DEXTROMETHORPHAN POLISTIREX ER 30 MG/5ML PO LQCR  2 tsp every 12 hr as needed     . VITAMIN D2 2000 UNITS PO TABS Oral Take 1 tablet by mouth daily.      . FUROSEMIDE 40 MG PO TABS Oral Take 20 mg by mouth daily.      . INSULIN ISOPHANE & REGULAR (70-30) 100 UNIT/ML [place] SUSP Subcutaneous Inject into the skin daily with breakfast. 33 units in the morning And 9 units in evening    . LOSARTAN POTASSIUM 50 MG PO TABS Oral Take 25 mg by mouth daily.      [name] METOPROLOL TARTRATE 50 MG PO TABS Oral Take 25 mg by mouth daily.      . MOMETASONE FUROATE 220 MCG/INH IN AEPB Inhalation Inhale 2 puffs into the  lungs daily.     . MOMETASONE FUROATE 50 MCG/ACT NA SUSP Nasal Place 2 sprays into the nose daily as needed.     [name] MONTELUKAST SODIUM 10 MG PO TABS Oral Take 10 mg by mouth daily.      [name] PRESERVISION AREDS PO Oral Take 1 capsule by mouth 2 (two) times daily.      . TRAVOPROST 0.004 % OP SOLN Both Eyes Place 1 drop into both eyes daily.     [name] EXENATIDE 10 MCG/0.04ML [place] SOLN Subcutaneous Inject into the skin 2 (two) times daily.     [name] NITROGLYCERIN 0.4 MG SL SUBL Sublingual Place 0.4 mg under the tongue every 5 (five) minutes as needed.      [name] SIMVASTATIN 40 MG PO TABS Oral Take 40 mg by mouth at bedtime.        BP 112/71  Pulse 70  Temp(Src) 98.9 F (37.2 C) (Oral)  Resp 18  SpO2 97%  Physical Exam  Nursing note and vitals reviewed. Constitutional: He appears well-developed and well-nourished. No distress.  HENT:  Head: Normocephalic and atraumatic.  Right Ear: Tympanic  membrane, external ear and ear canal normal.  Left Ear: Tympanic membrane, external ear and ear canal normal.  Nose: Nose normal.  Mouth/Throat: Uvula is midline, oropharynx is clear and moist and mucous membranes are normal. No oropharyngeal exudate, posterior oropharyngeal edema or posterior oropharyngeal erythema.  Neck: Neck supple.  Cardiovascular: Normal rate, regular rhythm and normal heart sounds.   Pulmonary/Chest: Effort normal and breath sounds normal. No respiratory distress.  Abdominal: Soft. Bowel sounds are normal. He exhibits no distension and no mass. There is no hepatosplenomegaly. There is tenderness (mild) in the left upper quadrant and left lower quadrant. There is no rebound and no guarding.  Lymphadenopathy:    He has no cervical adenopathy.  Neurological: He is alert.  Skin: Skin is warm and dry.  Psychiatric: He has a normal mood and affect.    ED Course  Procedures (including critical care time)  Labs Reviewed - No data to display No results found.   1. Acute diarrhea       MDM  Onset of acute diarrhea yesterday, with exposure to GI illness from wife. VSS and no s/s of dehydration. Mild Lt abdominal TTP, w/o guarding. Afebrile.         Melody Comas, [place] 07/03/11 1022

## 2011-07-03 NOTE — Discharge Instructions (Signed)
You do not appear to have any signs or symptoms of dehydration at this time. It is important to stay well hydrated with diarrhea. Drink clear fluids. You know you are drinking enough fluids if your urine is clear in color. If your symptoms change or worsen go to the ER for further evaluation and possible IV fluids. Watch for decreased urination, increased thirst, heart racing, and dizziness.   Diet for Diarrhea, Adult Having frequent, runny stools (diarrhea) has many causes. Diarrhea may be caused or worsened by food or drink. Diarrhea may be relieved by changing your diet. IF YOU ARE NOT TOLERATING SOLID FOODS:  Drink enough water and fluids to keep your urine clear or pale yellow.   Avoid sugary drinks and sodas as well as milk-based beverages.   Avoid beverages containing caffeine and alcohol.   You may try rehydrating beverages. You can make your own by following this recipe:    tsp table salt.    tsp baking soda.   ? tsp salt substitute (potassium chloride).   1 tbs + 1 tsp sugar.   1 qt water.  As your stools become more solid, you can start eating solid foods. Add foods one at a time. If a certain food causes your diarrhea to get worse, avoid that food and try other foods. A low fiber, low-fat, and lactose-free diet is recommended. Small, frequent meals may be better tolerated.  Starches  Allowed:  White, [place], and pita breads, plain rolls, buns, bagels. Plain muffins, matzo. Soda, saltine, or graham crackers. Pretzels, melba toast, zwieback. Cooked cereals made with water: cornmeal, farina, cream cereals. Dry cereals: refined corn, wheat, rice. Potatoes prepared any way without skins, refined macaroni, spaghetti, noodles, refined rice.   Avoid:  Bread, rolls, or crackers made with whole wheat, multi-grains, rye, bran seeds, nuts, or coconut. Corn tortillas or taco shells. Cereals containing whole grains, multi-grains, bran, coconut, nuts, or raisins. Cooked or dry oatmeal.  Coarse wheat cereals, granola. Cereals advertised as "high-fiber." Potato skins. Whole grain pasta, wild or brown rice. Popcorn. Sweet potatoes/yams. Sweet rolls, doughnuts, waffles, pancakes, sweet breads.  Vegetables  Allowed: Strained tomato and vegetable juices. Most well-cooked and canned vegetables without seeds. Fresh: Tender lettuce, cucumber without the skin, cabbage, spinach, bean sprouts.   Avoid: Fresh, cooked, or canned: Artichokes, baked beans, beet greens, broccoli, Brussels sprouts, corn, kale, legumes, peas, sweet potatoes. Cooked: Green or red cabbage, spinach. Avoid large servings of any vegetables, because vegetables shrink when cooked, and they contain more fiber per serving than fresh vegetables.  Fruit  Allowed: All fruit juices except prune juice. Cooked or canned: Apricots, applesauce, cantaloupe, cherries, fruit cocktail, grapefruit, grapes, kiwi, mandarin oranges, peaches, pears, plums, watermelon. Fresh: Apples without skin, ripe banana, grapes, cantaloupe, cherries, grapefruit, peaches, oranges, plums. Keep servings limited to  cup or 1 piece.   Avoid: Fresh: Apple with skin, apricots, mango, pears, raspberries, strawberries. Prune juice, stewed or dried prunes. Dried fruits, raisins, dates. Large servings of all fresh fruits.  Meat and Meat Substitutes  Allowed: Ground or well-cooked tender beef, ham, veal, lamb, pork, or poultry. Eggs, plain cheese. Fish, oysters, shrimp, lobster, other seafoods. Liver, organ meats.   Avoid: Tough, fibrous meats with gristle. Peanut butter, smooth or chunky. Cheese, nuts, seeds, legumes, dried peas, beans, lentils.  Milk  Allowed: Yogurt, lactose-free milk, kefir, drinkable yogurt, buttermilk, soy milk.   Avoid: Milk, chocolate milk, beverages made with milk, such as milk shakes.  Soups  Allowed: Bouillon, broth, or soups  made from allowed foods. Any strained soup.   Avoid: Soups made from vegetables that are not allowed,  cream or milk-based soups.  Desserts and Sweets  Allowed: Sugar-free gelatin, sugar-free frozen ice pops made without sugar alcohol.   Avoid: Plain cakes and cookies, pie made with allowed fruit, pudding, custard, cream pie. Gelatin, fruit, ice, sherbet, frozen ice pops. Ice cream, ice milk without nuts. Plain hard candy, honey, jelly, molasses, syrup, sugar, chocolate syrup, gumdrops, marshmallows.  Fats and Oils  Allowed: Avoid any fats and oils.   Avoid: Seeds, nuts, olives, avocados. Margarine, butter, cream, mayonnaise, salad oils, plain salad dressings made from allowed foods. Plain gravy, crisp bacon without rind.  Beverages  Allowed: Water, decaffeinated teas, oral rehydration solutions, sugar-free beverages.   Avoid: Fruit juices, caffeinated beverages (coffee, tea, soda or pop), alcohol, sports drinks, or lemon-lime soda or pop.  Condiments  Allowed: Ketchup, mustard, horseradish, vinegar, cream sauce, cheese sauce, cocoa powder. Spices in moderation: allspice, basil, bay leaves, celery powder or leaves, cinnamon, cumin powder, curry powder, ginger, mace, marjoram, onion or garlic powder, oregano, paprika, parsley flakes, ground pepper, rosemary, sage, savory, tarragon, thyme, turmeric.   Avoid: Coconut, honey.  Weight Monitoring: Weigh yourself every day. You should weigh yourself in the morning after you urinate and before you eat breakfast. Wear the same amount of clothing when you weigh yourself. Record your weight daily. Bring your recorded weights to your clinic visits. Tell your caregiver right away if you have gained 3 lb/1.4 kg or more in 1 day, 5 lb/2.3 kg in a week, or whatever amount you were told to report. SEEK IMMEDIATE MEDICAL CARE IF:   You are unable to keep fluids down.   You start to throw up (vomit) or diarrhea keeps coming back (persistent).   Abdominal pain develops, increases, or can be felt in one place (localizes).   You have an oral temperature above  102 F (38.9 C), not controlled by medicine.   Diarrhea contains blood or mucus.   You develop excessive weakness, dizziness, fainting, or extreme thirst.  MAKE SURE YOU:   Understand these instructions.   Will watch your condition.   Will get help right away if you are not doing well or get worse.  Document Released: 06/15/2003 Document Revised: 03/14/2011 Document Reviewed: 10/06/2008 [facility] Patient Information 2012 [place], [place].

## 2011-07-03 NOTE — ED Notes (Signed)
Pt with onset of diarrhea Tuesday am -  Today episodes x 3 - pt with c/o very mild nausea - diabetic cbg at home 140 - pt has not been eating - drinking clear liquids

## 2011-07-08 NOTE — ED Provider Notes (Signed)
Medical screening examination/treatment/procedure(s) were performed by non-physician practitioner and as supervising physician I was immediately available for consultation/collaboration.  Luiz Blare MD   Luiz Blare, MD 07/08/11 [phone]

## 2011-08-05 ENCOUNTER — Telehealth: Payer: Self-pay | Admitting: Hematology & Oncology

## 2011-08-05 ENCOUNTER — Ambulatory Visit (HOSPITAL_BASED_OUTPATIENT_CLINIC_OR_DEPARTMENT_OTHER): Payer: Medicare Other | Admitting: Hematology & Oncology

## 2011-08-05 ENCOUNTER — Other Ambulatory Visit (HOSPITAL_BASED_OUTPATIENT_CLINIC_OR_DEPARTMENT_OTHER): Payer: Medicare Other | Admitting: Lab

## 2011-08-05 VITALS — BP 116/67 | HR 76 | Temp 97.7°F | Ht 67.0 in | Wt 216.0 lb

## 2011-08-05 DIAGNOSIS — D45 Polycythemia vera: Secondary | ICD-10-CM

## 2011-08-05 DIAGNOSIS — E119 Type 2 diabetes mellitus without complications: Secondary | ICD-10-CM

## 2011-08-05 LAB — CBC WITH DIFFERENTIAL (CANCER CENTER ONLY)
BASO%: 0.2 % (ref 0.0–2.0)
HCT: 42.7 % (ref 38.7–49.9)
LYMPH%: 21.2 % (ref 14.0–48.0)
MCH: 30.7 pg (ref 28.0–33.4)
MCHC: 34.2 g/dL (ref 32.0–35.9)
MCV: 90 fL (ref 82–98)
MONO#: 0.5 10*3/uL (ref 0.1–0.9)
NEUT%: 68.7 % (ref 40.0–80.0)
RDW: 13.6 % (ref 11.1–15.7)

## 2011-08-05 NOTE — Progress Notes (Signed)
CC:   [name], MD Georga Hacking, M.D.  DIAGNOSIS:  Polycythemia vera.  CURRENT THERAPY:  Phlebotomy to maintain hematocrit less than 45%.  INTERVAL HISTORY:  Donald Kaiser comes in.  He is doing okay.  He is having some chest discomfort.  This sounds like he may need to have some stents put in where he had a bypass surgery a while back.  He has not had any episodes of syncope.  Whenever, we take blood out of him, he gets very vasovagal.  He says that his diabetes is doing okay.  He still has the pain issues but he really seems to be getting by with this and tolerating this quite well.  However, he does have some slight bruising from being on aspirin.  PHYSICAL EXAM:  General:  This is a well-developed, well-nourished, white gentleman in no obvious distress.  Vital Signs:  Temperature of 97.3, pulse 76, respiratory rate 20, blood pressure 116/67.  Weight is 216.  Head and Neck:  Normocephalic, atraumatic skull.  There are no ocular or oral lesions.  There are no palpable cervical or supraclavicular lymph nodes.  Lungs:  Clear bilaterally.  Cardiac: Regular rate and rhythm with a normal S1 and S2.  There are no murmurs, rubs, or bruits.  Abdominal Exam:  Soft with good bowel sounds.  There is no palpable abdominal mass.  There is no palpable hepatosplenomegaly. Extremities:  No clubbing, cyanosis, or edema.  Skin:  Some scattered ecchymoses.  LABORATORY STUDIES:  White count is 6.2, hemoglobin 14.6, hematocrit 42.7, platelet count 120.  IMPRESSION:  Donald Kaiser is an 78 year old gentleman with polycythemia.  He has done well with this.  From my point of view, he has really not had any complications from this.  His last phlebotomy was back in December.  We will go ahead and plan to get him back to see [place] probably in another 3 months or so.  We will see if he does need to be phlebotomized.    ______________________________ Josph Macho, M.D. PRE/MEDQ  D:   08/05/2011  T:  08/05/2011  Job:  [id number]

## 2011-08-05 NOTE — Telephone Encounter (Signed)
Mailed 11-04-11 appointment to pt

## 2011-08-05 NOTE — Progress Notes (Signed)
This office note has been dictated.

## 2011-10-23 ENCOUNTER — Encounter: Payer: Self-pay | Admitting: Vascular Surgery

## 2011-11-04 ENCOUNTER — Other Ambulatory Visit (HOSPITAL_BASED_OUTPATIENT_CLINIC_OR_DEPARTMENT_OTHER): Payer: Medicare Other | Admitting: Lab

## 2011-11-04 ENCOUNTER — Ambulatory Visit (HOSPITAL_BASED_OUTPATIENT_CLINIC_OR_DEPARTMENT_OTHER): Payer: Medicare Other | Admitting: Medical

## 2011-11-04 ENCOUNTER — Ambulatory Visit: Payer: Medicare Other

## 2011-11-04 VITALS — BP 105/63 | HR 75 | Temp 97.2°F | Ht 67.0 in | Wt 216.0 lb

## 2011-11-04 DIAGNOSIS — D45 Polycythemia vera: Secondary | ICD-10-CM

## 2011-11-04 LAB — CBC WITH DIFFERENTIAL (CANCER CENTER ONLY)
BASO%: 0.1 % (ref 0.0–2.0)
EOS%: 1.5 % (ref 0.0–7.0)
HCT: 45.2 % (ref 38.7–49.9)
LYMPH#: 1.5 10*3/uL (ref 0.9–3.3)
LYMPH%: 17.1 % (ref 14.0–48.0)
MCHC: 34.3 g/dL (ref 32.0–35.9)
NEUT%: 72.4 % (ref 40.0–80.0)
RDW: 13.6 % (ref 11.1–15.7)

## 2011-11-04 NOTE — Progress Notes (Signed)
Patient Name : Donald Kaiser, Donald Kaiser MR #[id number]  DOB: 1926/12/01 Encounter Date: 11/04/2011 Dictated by Eunice Blase, PA-C  Diagnosis: Polycythemia vera.  Current therapy: Phlebotomy to maintain hematocrit less than 45%.  Interim history: Donald Kaiser comes in today for an office followup visit.  His family accompanies him.  Overall, he, reports, he is doing relatively well.  He is not reported any episodes of dizziness or syncope.  Whenever we phlebotomize him, he does get very vasovagal.  He is not reporting any headaches or blurry vision.  He is not reporting any cough, chest pain, or increasing, shortness of breath, or difficulty breathing.  He is not reporting any nausea, vomiting, diarrhea, constipation.  He has a decent appetite.  He, reports, that his diabetes is doing okay.  His hematocrit today is 45.2.  I do not see the need to give him a phlebotomy today.  His last phlebotomy was back in December.  Overall, Donald Kaiser is doing quite well without any new problems or complications.  Review of Systems: Pt. Denies any changes in their vision, hearing, adenopathy, fevers, chills, nausea, vomiting, diarrhea, constipation, chest pain, shortness of breath, passing blood, passing out, blacking out,  any changes in skin, joints, neurologic or psychiatric except as noted.  Physical Exam: This is a very pleasant, 78 year old, white male, in no obvious distress Vitals: Temperature 97.2 degrees.  Pulse 75 respirations 22 blood pressure 105/63, weight 216 pounds HEENT reveals a normocephalic, atraumatic skull, no scleral icterus, no oral lesions  Neck is supple without any cervical or supraclavicular adenopathy.  Lungs are clear to auscultation bilaterally. There are no wheezes, rales or rhonci Cardiac is regular rate and rhythm with a normal S1 and S2. There are no murmurs, rubs, or bruits.  Abdomen is soft with good bowel sounds there's a palpable mass. There is no palpable hepatosplenomegaly.  There is no palpable fluid wave.  Musculoskeletal no tenderness of the spine, ribs, or hips.  Extremities there are no clubbing, cyanosis, or edema.  Skin no petechia, purpura or ecchymosis Neurologic is nonfocal.  Laboratory Data: White count 8.7, hemoglobin 15.5, hematocrit 45.2, platelets 108,000  ferritin is pending from today  Current Outpatient Prescriptions on File Prior to Visit  Medication Sig Dispense Refill  . albuterol-ipratropium (COMBIVENT) 18-103 MCG/ACT inhaler Inhale 1-2 puffs into the lungs every 4 (four) hours as needed.        [name] aspirin 81 MG tablet Take 81 mg by mouth daily.        . benzonatate (TESSALON) 100 MG capsule Take 1 capsule (100 mg total) by mouth 3 (three) times daily as needed for cough.  20 capsule  6  . dextromethorphan (DELSYM) 30 MG/5ML liquid 2 tsp every 12 hr as needed       . Ergocalciferol (VITAMIN D2) 2000 UNITS TABS Take 1 tablet by mouth daily.        [name] exenatide (BYETTA) 10 MCG/0.04ML SOLN Inject into the skin 2 (two) times daily.       . furosemide (LASIX) 40 MG tablet Take 20 mg by mouth daily.        . insulin NPH-insulin regular (NOVOLIN 70/30) (70-30) 100 UNIT/ML injection Inject into the skin daily with breakfast. 30 units in the morning And 8 units in evening      . losartan (COZAAR) 50 MG tablet Take 25 mg by mouth daily.        . metoprolol (LOPRESSOR) 50 MG tablet Take 25 mg by mouth daily.        [name]  mometasone (ASMANEX 120 METERED DOSES) 220 MCG/INH inhaler Inhale 2 puffs into the lungs daily.       . mometasone (NASONEX) 50 MCG/ACT nasal spray Place 2 sprays into the nose daily as needed.       . montelukast (SINGULAIR) 10 MG tablet Take 10 mg by mouth daily.        . Multiple Vitamins-Minerals (PRESERVISION AREDS PO) Take 1 capsule by mouth 2 (two) times daily.        . nitroGLYCERIN (NITROSTAT) 0.4 MG SL tablet Place 0.4 mg under the tongue every 5 (five) minutes as needed.        . simvastatin (ZOCOR) 40 MG tablet Take 40 mg by  mouth at bedtime.        . travoprost, benzalkonium, (TRAVATAN) 0.004 % ophthalmic solution Place 1 drop into both eyes daily.         Assessment/Plan: This is a pleasant, 78 year old, white gentleman, with the following issues: #1 polycythemia vera-overall, he has done quite well with this and has not had any complications.  His last phlebotomy was back in December.  He does not need a phlebotomy today.  We will continue to monitor him closely.  #2 followup-we will plan to get Mr. Klump back in 3 months, but before then should there be questions or concerns.

## 2011-11-04 NOTE — Progress Notes (Signed)
[name], Donald Kaiser did not required phlebotomy today.

## 2011-11-25 ENCOUNTER — Ambulatory Visit (INDEPENDENT_AMBULATORY_CARE_PROVIDER_SITE_OTHER): Payer: Medicare Other | Admitting: Critical Care Medicine

## 2011-11-25 ENCOUNTER — Encounter: Payer: Self-pay | Admitting: Critical Care Medicine

## 2011-11-25 VITALS — BP 120/70 | HR 71 | Temp 97.8°F | Wt 218.0 lb

## 2011-11-25 DIAGNOSIS — J45909 Unspecified asthma, uncomplicated: Secondary | ICD-10-CM

## 2011-11-25 DIAGNOSIS — Z789 Other specified health status: Secondary | ICD-10-CM

## 2011-11-25 DIAGNOSIS — Z9181 History of falling: Secondary | ICD-10-CM

## 2011-11-25 NOTE — Assessment & Plan Note (Addendum)
Moderate persistent asthma stable at this time Plan No change in inhaled or maintenance medications. Return in  6  months

## 2011-11-25 NOTE — Progress Notes (Signed)
Subjective:    Patient ID: Donald Kaiser, male    DOB: February 25, 1927, 78 y.o.   MRN: [id number]  HPI  78 y.o.WM Asthma hx.  11/25/2011 Doing well since last ov PUL ASTHMA HISTORY 11/25/2011 12/04/2010  Symptoms >2 days/week 0-2 days/week  Nighttime awakenings 0-2/month 0-2/month  Interference with activity Minor limitations Some limitations  SABA use 0-2 days/wk 0-2 days/wk  Exacerbations requiring oral steroids 0-1 / year 0-1 / year  Pt denies any significant sore throat, nasal congestion or excess secretions, fever, chills, sweats, unintended weight loss, pleurtic or exertional chest pain, orthopnea PND, or leg swelling Pt denies any increase in rescue therapy over baseline, denies waking up needing it or having any early am or nocturnal exacerbations of coughing/wheezing/or dyspnea. Pt also denies any obvious fluctuation in symptoms with  weather or environmental change or other alleviating or aggravating factors      Past Medical History  Diagnosis Date  . OSA (obstructive sleep apnea)   . GERD (gastroesophageal reflux disease)   . Diabetes mellitus type II   . Asthma   . DVT (deep venous thrombosis)     chronic  . Post-phlebitic syndrome   . Polycythemia vera     Ennever  . COPD (chronic obstructive pulmonary disease)   . Hypertension      Family History  Problem Relation Age of Onset  . Emphysema Sister   . Allergies Sister   . Asthma Sister   . Heart disease Brother   . Heart disease Brother   . Rheum arthritis Sister   . Rheum arthritis Mother   . Cancer Mother   . Cancer Sister   . Kidney failure Father   . Stroke Sister      History   Social History  . Marital Status: Married    Spouse Name: N/A    Number of Children: N/A  . Years of Education: N/A   Occupational History  . Retired     Optician, dispensing   Social History Main Topics  . Smoking status: Never Smoker   . Smokeless tobacco: Never Used  . Alcohol Use: No  . Drug Use: No  . Sexually  Active: Not on file   Other Topics Concern  . Not on file   Social History Narrative  . No narrative on file     Allergies  Allergen Reactions  . Tramadol     Dizziness, lightheadeness     Outpatient Prescriptions Prior to Visit  Medication Sig Dispense Refill  . albuterol-ipratropium (COMBIVENT) 18-103 MCG/ACT inhaler Inhale 1-2 puffs into the lungs every 4 (four) hours as needed.        [name] aspirin 81 MG tablet Take 81 mg by mouth daily.        . benzonatate (TESSALON) 100 MG capsule Take 1 capsule (100 mg total) by mouth 3 (three) times daily as needed for cough.  20 capsule  6  . dextromethorphan (DELSYM) 30 MG/5ML liquid 2 tsp every 12 hr as needed       . Ergocalciferol (VITAMIN D2) 2000 UNITS TABS Take 1 tablet by mouth daily.        [name] exenatide (BYETTA) 10 MCG/0.04ML SOLN Inject into the skin 2 (two) times daily.       . furosemide (LASIX) 40 MG tablet Take 20 mg by mouth 2 (two) times daily as needed.       . insulin NPH-insulin regular (NOVOLIN 70/30) (70-30) 100 UNIT/ML injection Inject into the skin daily with breakfast.  30 units in the morning And 8 units in evening      . losartan (COZAAR) 50 MG tablet Take 25 mg by mouth daily.        . metoprolol (LOPRESSOR) 50 MG tablet Take 50 mg by mouth daily.       . mometasone (ASMANEX 120 METERED DOSES) 220 MCG/INH inhaler Inhale 2 puffs into the lungs daily.       . mometasone (NASONEX) 50 MCG/ACT nasal spray Place 2 sprays into the nose daily as needed.       . montelukast (SINGULAIR) 10 MG tablet Take 10 mg by mouth daily.        . Multiple Vitamins-Minerals (PRESERVISION AREDS PO) Take 1 capsule by mouth 2 (two) times daily.        . nitroGLYCERIN (NITROSTAT) 0.4 MG SL tablet Place 0.4 mg under the tongue every 5 (five) minutes as needed.        . simvastatin (ZOCOR) 40 MG tablet Take 40 mg by mouth at bedtime.        . traMADol (ULTRAM) 50 MG tablet Take 50 mg by mouth every 6 (six) hours as needed.      . travoprost,  benzalkonium, (TRAVATAN) 0.004 % ophthalmic solution Place 1 drop into both eyes daily.          Review of Systems  Constitutional:   No  weight loss, night sweats,  Fevers, chills, + fatigue, lassitude. HEENT:   No headaches,  Difficulty swallowing,  Tooth/dental problems,  Sore throat,                No sneezing, itching, ear ache, nasal congestion, post nasal drip,   CV:  No chest pain,  Orthopnea, PND, swelling in lower extremities, anasarca, dizziness, palpitations  GI  No heartburn, indigestion, abdominal pain, nausea, vomiting, diarrhea, change in bowel habits, loss of appetite  Resp: Notes  shortness of breath with exertion not  at rest.  No excess mucus, no productive cough,     No coughing up of blood.  No change in color of mucus.  No wheezing.  No chest wall deformity  Skin: no rash or lesions.  GU: no dysuria, change in color of urine, no urgency or frequency.  No flank pain.  MS:  No joint pain or swelling.  No decreased range of motion.  No back pain.  Psych:  No change in mood or affect. No depression or anxiety.  No memory loss. w    Objective:   Physical Exam   Filed Vitals:   11/25/11 1450  BP: 120/70  Pulse: 71  Temp: 97.8 F (36.6 C)  TempSrc: Oral  Weight: 218 lb (98.884 kg)  SpO2: 98%    Gen: Pleasant, well-nourished, in no distress,  normal affect  ENT: No lesions,  mouth clear,  oropharynx clear,no postnasal drip  Neck: No JVD, no TMG, no carotid bruits  Lungs: No use of accessory muscles, no dullness to percussion,  Clear   Cardiovascular: RRR, heart sounds normal, no murmur or gallops, no peripheral edema  Abdomen: soft and NT, no HSM,  BS normal  Musculoskeletal: No deformities, no cyanosis or clubbing  Neuro: alert, non focal  Skin: Warm, no lesions or rashes          Assessment & Plan:    ASTHMA Moderate persistent asthma stable at this time Plan No change in inhaled or maintenance medications. Return in  6   months    Updated Medication List  Outpatient Encounter Prescriptions as of 11/25/2011  Medication Sig Dispense Refill  . albuterol-ipratropium (COMBIVENT) 18-103 MCG/ACT inhaler Inhale 1-2 puffs into the lungs every 4 (four) hours as needed.        [name] aspirin 81 MG tablet Take 81 mg by mouth daily.        . benzonatate (TESSALON) 100 MG capsule Take 1 capsule (100 mg total) by mouth 3 (three) times daily as needed for cough.  20 capsule  6  . dextromethorphan (DELSYM) 30 MG/5ML liquid 2 tsp every 12 hr as needed       . Ergocalciferol (VITAMIN D2) 2000 UNITS TABS Take 1 tablet by mouth daily.        [name] exenatide (BYETTA) 10 MCG/0.04ML SOLN Inject into the skin 2 (two) times daily.       . furosemide (LASIX) 40 MG tablet Take 20 mg by mouth 2 (two) times daily as needed.       . insulin NPH-insulin regular (NOVOLIN 70/30) (70-30) 100 UNIT/ML injection Inject into the skin daily with breakfast. 30 units in the morning And 8 units in evening      . losartan (COZAAR) 50 MG tablet Take 25 mg by mouth daily.        . metoprolol (LOPRESSOR) 50 MG tablet Take 50 mg by mouth daily.       . mometasone (ASMANEX 120 METERED DOSES) 220 MCG/INH inhaler Inhale 2 puffs into the lungs daily.       . mometasone (NASONEX) 50 MCG/ACT nasal spray Place 2 sprays into the nose daily as needed.       . montelukast (SINGULAIR) 10 MG tablet Take 10 mg by mouth daily.        . Multiple Vitamins-Minerals (PRESERVISION AREDS PO) Take 1 capsule by mouth 2 (two) times daily.        . nitroGLYCERIN (NITROSTAT) 0.4 MG SL tablet Place 0.4 mg under the tongue every 5 (five) minutes as needed.        . simvastatin (ZOCOR) 40 MG tablet Take 40 mg by mouth at bedtime.        . traMADol (ULTRAM) 50 MG tablet Take 50 mg by mouth every 6 (six) hours as needed.      . travoprost, benzalkonium, (TRAVATAN) 0.004 % ophthalmic solution Place 1 drop into both eyes daily.               Updated Medication List Outpatient Encounter  Prescriptions as of 11/25/2011  Medication Sig Dispense Refill  . albuterol-ipratropium (COMBIVENT) 18-103 MCG/ACT inhaler Inhale 1-2 puffs into the lungs every 4 (four) hours as needed.        [name] aspirin 81 MG tablet Take 81 mg by mouth daily.        . benzonatate (TESSALON) 100 MG capsule Take 1 capsule (100 mg total) by mouth 3 (three) times daily as needed for cough.  20 capsule  6  . dextromethorphan (DELSYM) 30 MG/5ML liquid 2 tsp every 12 hr as needed       . Ergocalciferol (VITAMIN D2) 2000 UNITS TABS Take 1 tablet by mouth daily.        [name] exenatide (BYETTA) 10 MCG/0.04ML SOLN Inject into the skin 2 (two) times daily.       . furosemide (LASIX) 40 MG tablet Take 20 mg by mouth 2 (two) times daily as needed.       . insulin NPH-insulin regular (NOVOLIN 70/30) (70-30) 100 UNIT/ML injection Inject into the skin daily  with breakfast. 30 units in the morning And 8 units in evening      . losartan (COZAAR) 50 MG tablet Take 25 mg by mouth daily.        . metoprolol (LOPRESSOR) 50 MG tablet Take 50 mg by mouth daily.       . mometasone (ASMANEX 120 METERED DOSES) 220 MCG/INH inhaler Inhale 2 puffs into the lungs daily.       . mometasone (NASONEX) 50 MCG/ACT nasal spray Place 2 sprays into the nose daily as needed.       . montelukast (SINGULAIR) 10 MG tablet Take 10 mg by mouth daily.        . Multiple Vitamins-Minerals (PRESERVISION AREDS PO) Take 1 capsule by mouth 2 (two) times daily.        . nitroGLYCERIN (NITROSTAT) 0.4 MG SL tablet Place 0.4 mg under the tongue every 5 (five) minutes as needed.        . simvastatin (ZOCOR) 40 MG tablet Take 40 mg by mouth at bedtime.        . traMADol (ULTRAM) 50 MG tablet Take 50 mg by mouth every 6 (six) hours as needed.      . travoprost, benzalkonium, (TRAVATAN) 0.004 % ophthalmic solution Place 1 drop into both eyes daily.

## 2011-11-25 NOTE — Patient Instructions (Addendum)
No change in medications. Return in       6 months  Remember flu vaccine this fall

## 2012-02-04 ENCOUNTER — Other Ambulatory Visit (HOSPITAL_BASED_OUTPATIENT_CLINIC_OR_DEPARTMENT_OTHER): Payer: Medicare Other | Admitting: Lab

## 2012-02-04 ENCOUNTER — Ambulatory Visit: Payer: Medicare Other

## 2012-02-04 ENCOUNTER — Ambulatory Visit (HOSPITAL_BASED_OUTPATIENT_CLINIC_OR_DEPARTMENT_OTHER): Payer: Medicare Other | Admitting: Hematology & Oncology

## 2012-02-04 VITALS — BP 114/52 | HR 72 | Temp 97.7°F | Resp 18 | Ht 67.0 in | Wt 216.0 lb

## 2012-02-04 DIAGNOSIS — D45 Polycythemia vera: Secondary | ICD-10-CM

## 2012-02-04 LAB — CBC WITH DIFFERENTIAL (CANCER CENTER ONLY)
BASO%: 0.2 % (ref 0.0–2.0)
EOS%: 1.4 % (ref 0.0–7.0)
LYMPH#: 1.3 10*3/uL (ref 0.9–3.3)
MCHC: 34.6 g/dL (ref 32.0–35.9)
NEUT#: 4.5 10*3/uL (ref 1.5–6.5)
Platelets: 103 10*3/uL — ABNORMAL LOW (ref 145–400)
RDW: 13.9 % (ref 11.1–15.7)

## 2012-02-04 NOTE — Progress Notes (Signed)
No phlebotomy today per m.d order.

## 2012-02-04 NOTE — Progress Notes (Signed)
This office note has been dictated.

## 2012-02-05 NOTE — Progress Notes (Signed)
CC:   [name], MD  DIAGNOSIS:  Polycythemia vera.  CURRENT THERAPY:  Phlebotomy to maintain hematocrit less than 45%.  INTERIM HISTORY:  [name] comes in for his followup.  His eyesight is a problem now.  He has macular degeneration.  He is not able to drive.  Otherwise he seems to be doing okay.  His back bothers him but he is used to this.  He has had no problems with nausea or vomiting.  There has been no bleeding.  There has been no leg swelling.  His blood sugars seem to be doing okay.  He is on his litany of medications.  His last ferritin was 43 back in July.  He was last phlebotomized, I think, back in the springtime.  PHYSICAL EXAMINATION:  This is a well-developed, well-nourished white gentleman in no obvious distress.  Vital signs:  97.7, pulse 72, respiratory rate 18, blood pressure 114/52.  Weight is 216.  Head and neck:  Normocephalic, atraumatic skull.  There are no ocular or oral lesions.  There are no palpable cervical or supraclavicular lymph nodes. Lungs:  Clear bilaterally.  Cardiac:  Regular rate and rhythm with a normal S1 and S2.  There are no murmurs, rubs or bruits.  Abdomen: Soft, obese.  He has good bowel sounds.  There is no palpable abdominal mass.  There is no palpable hepatosplenomegaly.  Back:  No tenderness over the spine, ribs, or hips.  Extremities:  No clubbing, cyanosis or edema.  Neurological:  No focal neurological deficits.  LABORATORY STUDIES:  White cell count is 6.4, hemoglobin 15.8, hematocrit 45.6, platelet count 103.  IMPRESSION:  Mr. Biggers is an 78 year old gentleman with polycythemia.  He is doing well right now.  He does have problems with phlebotomies with vasovagal reaction that is quite severe.  I do not think that his hematocrit is up high enough that we need to phlebotomize him today.  We will go ahead and plan to get back in 6 weeks' time.  We will see if he needs to be phlebotomized at that  point.    ______________________________ Josph Macho, M.D. PRE/MEDQ  D:  02/04/2012  T:  02/05/2012  Job:  [id number]

## 2012-03-17 ENCOUNTER — Other Ambulatory Visit (HOSPITAL_BASED_OUTPATIENT_CLINIC_OR_DEPARTMENT_OTHER): Payer: Medicare Other | Admitting: Lab

## 2012-03-17 ENCOUNTER — Ambulatory Visit (HOSPITAL_BASED_OUTPATIENT_CLINIC_OR_DEPARTMENT_OTHER): Payer: Medicare Other

## 2012-03-17 ENCOUNTER — Other Ambulatory Visit: Payer: Medicare Other | Admitting: Lab

## 2012-03-17 ENCOUNTER — Ambulatory Visit: Payer: Medicare Other | Admitting: Medical

## 2012-03-17 ENCOUNTER — Ambulatory Visit (HOSPITAL_BASED_OUTPATIENT_CLINIC_OR_DEPARTMENT_OTHER): Payer: Medicare Other | Admitting: Medical

## 2012-03-17 VITALS — BP 121/59 | HR 86 | Temp 97.9°F | Resp 18 | Ht 67.0 in | Wt 217.0 lb

## 2012-03-17 VITALS — BP 111/68 | HR 77 | Temp 97.5°F | Resp 18

## 2012-03-17 DIAGNOSIS — D45 Polycythemia vera: Secondary | ICD-10-CM

## 2012-03-17 LAB — CBC WITH DIFFERENTIAL (CANCER CENTER ONLY)
Eosinophils Absolute: 0.1 10*3/uL (ref 0.0–0.5)
MCH: 30.1 pg (ref 28.0–33.4)
MONO#: 0.6 10*3/uL (ref 0.1–0.9)
MONO%: 8.8 % (ref 0.0–13.0)
NEUT#: 5.2 10*3/uL (ref 1.5–6.5)
Platelets: 102 10*3/uL — ABNORMAL LOW (ref 145–400)
RBC: 5.25 10*6/uL (ref 4.20–5.70)
WBC: 7.2 10*3/uL (ref 4.0–10.0)

## 2012-03-17 LAB — FERRITIN: Ferritin: 37 ng/mL (ref 22–322)

## 2012-03-17 NOTE — Progress Notes (Signed)
Donald Kaiser presents today for phlebotomy per MD orders. Phlebotomy procedure started at 1108 and ended at 1125. 500 ml  removed. Patient observed for 30 minutes after procedure without any incident. Patient tolerated procedure well. IV needle removed intact.

## 2012-03-17 NOTE — Progress Notes (Signed)
Diagnosis: Polycythemia vera.  Current therapy: Phlebotomy to maintain hematocrit less than 45%.  Interim history: Start Donald Kaiser presents today for an office followup visit.  His wife accompanies him.  Overall, he, reports, that he is doing relatively well.  He does have macular degeneration.  He is unable to drive.  We like to keep his hematocrit the low 45%.  It has been quite sometime since his last been phlebotomized.  Unfortunately, he does vasovagal with his phlebotomies.  His hematocrit is 47.4.  Today, as, such, we will need to phlebotomize him.  He, otherwise does not report any, headaches, excessive fatigue or new arthralgias or myalgias.  He does have back issues, however, this is chronic.  He, otherwise, states, he has a good appetite.  He denies any nausea, vomiting, diarrhea, constipation, chest pain, shortness of breath, or cough.  He denies any fevers, chills, her medications.  He denies any obvious, or abnormal bleeding.  He denies any headaches or rashes.  His last ferritin back on 02/04/2012 was 60.   Review of Systems: Constitutional:Negative for malaise/fatigue, fever, chills, weight loss, diaphoresis, activity change, appetite change, and unexpected weight change.  HEENT: Negative for double vision, blurred vision, visual loss, ear pain, tinnitus, congestion, rhinorrhea, epistaxis sore throat or sinus disease, oral pain/lesion, tongue soreness Respiratory: Negative for cough, chest tightness, shortness of breath, wheezing and stridor.  Cardiovascular: Negative for chest pain, palpitations, leg swelling, orthopnea, PND, DOE or claudication Gastrointestinal: Negative for nausea, vomiting, abdominal pain, diarrhea, constipation, blood in stool, melena, hematochezia, abdominal distention, anal bleeding, rectal pain, anorexia and hematemesis.  Genitourinary: Negative for dysuria, frequency, hematuria,  Musculoskeletal: Negative for myalgias, back pain, joint swelling, arthralgias and  gait problem.  Skin: Negative for rash, color change, pallor and wound.  Neurological:. Negative for dizziness/light-headedness, tremors, seizures, syncope, facial asymmetry, speech difficulty, weakness, numbness, headaches and paresthesias.  Hematological: Negative for adenopathy. Does not bruise/bleed easily.  Psychiatric/Behavioral:  Negative for depression, no loss of interest in normal activity or change in sleep pattern.   Physical Exam: This is a was an elderly, 78 year old, with well-developed, well-nourished, white gentleman , in no obvious distress Vitals: Temperature 97.9 degrees, pulse 86, respirations 18, blood pressure 121/59, weight 217 pounds HEENT reveals a normocephalic, atraumatic skull, no scleral icterus, no oral lesions  Neck is supple without any cervical or supraclavicular adenopathy.  Lungs are clear to auscultation bilaterally. There are no wheezes, rales or rhonci Cardiac is regular rate and rhythm with a normal S1 and S2. There are no murmurs, rubs, or bruits.  Abdomen is soft with good bowel sounds, there is no palpable mass. There is no palpable hepatosplenomegaly. There is no palpable fluid wave.  Musculoskeletal no tenderness of the spine, ribs, or hips.  Extremities there are no clubbing, cyanosis, or edema.  Skin no petechia, purpura or ecchymosis Neurologic is nonfocal.  Laboratory Data: White count 7.2, hemoglobin 15.8, hematocrit 47.4, platelets 102,000  Current Outpatient Prescriptions on File Prior to Visit  Medication Sig Dispense Refill  . albuterol-ipratropium (COMBIVENT) 18-103 MCG/ACT inhaler Inhale 1-2 puffs into the lungs every 4 (four) hours as needed.        [name] aspirin 81 MG tablet Take 81 mg by mouth daily.        . benzonatate (TESSALON) 100 MG capsule Take 1 capsule (100 mg total) by mouth 3 (three) times daily as needed for cough.  20 capsule  6  . dextromethorphan (DELSYM) 30 MG/5ML liquid 2 tsp every 12 hr as  needed       .  Ergocalciferol (VITAMIN D2) 2000 UNITS TABS Take 1 tablet by mouth daily.        [name] exenatide (BYETTA) 10 MCG/0.04ML SOLN Inject into the skin 2 (two) times daily.       . furosemide (LASIX) 40 MG tablet Take 20 mg by mouth 2 (two) times daily as needed.       . insulin NPH-insulin regular (NOVOLIN 70/30) (70-30) 100 UNIT/ML injection Inject into the skin daily with breakfast. 30 units in the morning And 8 units in evening      . losartan (COZAAR) 50 MG tablet Take 25 mg by mouth daily.        . metoprolol (LOPRESSOR) 50 MG tablet Take 50 mg by mouth daily.       . mometasone (ASMANEX 120 METERED DOSES) 220 MCG/INH inhaler Inhale 2 puffs into the lungs daily.       . mometasone (NASONEX) 50 MCG/ACT nasal spray Place 2 sprays into the nose daily as needed.       . montelukast (SINGULAIR) 10 MG tablet Take 10 mg by mouth daily.        . Multiple Vitamins-Minerals (PRESERVISION AREDS PO) Take 1 capsule by mouth 2 (two) times daily.        . nitroGLYCERIN (NITROSTAT) 0.4 MG SL tablet Place 0.4 mg under the tongue every 5 (five) minutes as needed.        . simvastatin (ZOCOR) 40 MG tablet Take 40 mg by mouth at bedtime.        . travoprost, benzalkonium, (TRAVATAN) 0.004 % ophthalmic solution Place 1 drop into both eyes daily.       . traMADol (ULTRAM) 50 MG tablet Take 50 mg by mouth every 6 (six) hours as needed.       Assessment/Plan: This is a pleasant, 78 year old, white gentleman, with the following issues:  #1.  Polycythemia vera.  Donald Kaiser is, hematocrit is 47.4, today.  As, such, we'll go ahead and phlebotomize him.  #2.  Followup.  We will follow back up with Donald Kaiser in 6 weeks, but before then should there be questions or concerns.

## 2012-03-17 NOTE — Patient Instructions (Addendum)
Therapeutic Phlebotomy Therapeutic phlebotomy is the controlled removal of blood from your body for the purpose of treating a medical condition. It is similar to donating blood. Usually, about a pint (470 mL) of blood is removed. The average adult has 9 to 12 pints (4.3 to 5.7 L) of blood. Therapeutic phlebotomy may be used to treat the following medical conditions:  Hemochromatosis. This is a condition in which there is too much iron in the blood.  Polycythemia vera. This is a condition in which there are too many red cells in the blood.  Porphyria cutanea tarda. This is a disease usually passed from one generation to the next (inherited). It is a condition in which an important part of hemoglobin is not made properly. This results in the build up of abnormal amounts of porphyrins in the body.  Sickle cell disease. This is an inherited disease. It is a condition in which the red blood cells form an abnormal crescent shape rather than a round shape. LET YOUR CAREGIVER KNOW ABOUT:  Allergies.  Medicines taken including herbs, eyedrops, over-the-counter medicines, and creams.  Use of steroids (by mouth or creams).  Previous problems with anesthetics or numbing medicine.  History of blood clots.  History of bleeding or blood problems.  Previous surgery.  Possibility of pregnancy, if this applies. RISKS AND COMPLICATIONS This is a simple and safe procedure. Problems are unlikely. However, problems can occur and may include:  Nausea or lightheadedness.  Low blood pressure.  Soreness, bleeding, swelling, or bruising at the needle insertion site.  Infection. BEFORE THE PROCEDURE  This is a procedure that can be done as an outpatient. Confirm the time that you need to arrive for your procedure. Confirm whether there is a need to fast or withhold any medications. It is helpful to wear clothing with sleeves that can be raised above the elbow. A blood sample may be done to determine the  amount of red blood cells or iron in your blood. Plan ahead of time to have someone drive you home after the procedure. PROCEDURE The entire procedure from preparation through recovery takes about 1 hour. The actual collection takes about 10 to 15 minutes.  A needle will be inserted into your vein.  Tubing and a collection bag will be attached to that needle.  Blood will flow through the needle and tubing into the collection bag.  You may be asked to open and close your hand slowly and continuously during the entire collection.  Once the specified amount of blood has been removed from your body, the collection bag and tubing will be clamped.  The needle will be removed.  Pressure will be held on the site of the needle insertion to stop the bleeding. Then a bandage will be placed over the needle insertion site. AFTER THE PROCEDURE  Your recovery will be assessed and monitored. If there are no problems, as an outpatient, you should be able to go home shortly after the procedure.  Document Released: 08/27/2010 Document Revised: 06/17/2011 Document Reviewed: 08/27/2010 [employer] Patient Information 2013 [employer].  

## 2012-04-28 ENCOUNTER — Ambulatory Visit: Payer: Medicare Other | Admitting: Hematology & Oncology

## 2012-04-28 ENCOUNTER — Other Ambulatory Visit: Payer: Medicare Other | Admitting: Lab

## 2012-04-29 ENCOUNTER — Ambulatory Visit (HOSPITAL_BASED_OUTPATIENT_CLINIC_OR_DEPARTMENT_OTHER): Payer: Medicare Other | Admitting: Hematology & Oncology

## 2012-04-29 ENCOUNTER — Ambulatory Visit: Payer: Medicare Other

## 2012-04-29 ENCOUNTER — Other Ambulatory Visit (HOSPITAL_BASED_OUTPATIENT_CLINIC_OR_DEPARTMENT_OTHER): Payer: Medicare Other | Admitting: Lab

## 2012-04-29 VITALS — BP 119/52 | HR 67 | Temp 97.4°F | Resp 20 | Ht 67.0 in | Wt 211.0 lb

## 2012-04-29 DIAGNOSIS — D45 Polycythemia vera: Secondary | ICD-10-CM

## 2012-04-29 LAB — CBC WITH DIFFERENTIAL (CANCER CENTER ONLY)
BASO%: 0.3 % (ref 0.0–2.0)
HCT: 44.4 % (ref 38.7–49.9)
LYMPH#: 1.3 10*3/uL (ref 0.9–3.3)
MONO#: 0.6 10*3/uL (ref 0.1–0.9)
NEUT#: 4 10*3/uL (ref 1.5–6.5)
NEUT%: 67.1 % (ref 40.0–80.0)
RDW: 13.8 % (ref 11.1–15.7)
WBC: 6 10*3/uL (ref 4.0–10.0)

## 2012-04-29 LAB — FERRITIN: Ferritin: 41 ng/mL (ref 22–322)

## 2012-04-29 NOTE — Progress Notes (Signed)
This office note has been dictated.

## 2012-04-29 NOTE — Progress Notes (Signed)
No phlebotomy needed per Dr. Myna Hidalgo order

## 2012-04-30 NOTE — Progress Notes (Signed)
CC:   [name], MD  DIAGNOSIS:  Polycythemia vera.  CURRENT THERAPY:  Phlebotomy to maintain hematocrit less than 45%.  INTERIM HISTORY:  Mr. Patti comes in for his followup.  He is doing okay.  He and his wife will be married 63 years this year.  He and his wife enjoyed the holidays.  They are basically being taken care of by their family.  He has had no chest pain.  He has had no problems with his blood sugars. He says his hemoglobin A1c is in the 6 range.  He has had no change in bowel or bladder habits.  He has chronic back discomfort which he seems to do pretty well with.  He has been losing weight.  His weight is now down to 211 pounds.  I think this, more than anything, will help his back.  PHYSICAL EXAMINATION:  General:  This is an elderly white gentleman in no obvious distress.  Vital signs:  Temperature of 97.4, pulse 67, respiratory rate 20, blood pressure 119/52.  Weight is 211 pounds.  Head and neck:  Normocephalic, atraumatic skull.  There are no ocular or oral lesions.  There are no palpable cervical or supraclavicular lymph nodes. Lungs:  Clear bilaterally.  Cardiac:  Regular rate and rhythm with a normal S1 and S2.  There are no murmurs, rubs, or bruits.  Abdomen: Soft with good bowel sounds.  There is no palpable abdominal mass. There is no fluid wave.  There is no palpable hepatosplenomegaly.  Back: No tenderness over the spine, ribs, or hips.  Extremities:  No clubbing, cyanosis, or edema.  Neurological:  No focal neurological deficits.  LABORATORY STUDIES:  White cell count is 6, hemoglobin 14.7, hematocrit 44.4, platelet count 135.  IMPRESSION:  Mr. Fessel is an 79 year old gentleman with polycythemia vera.  Thankfully, he does not need to be phlebotomized today. Typically, he has a tough time with phlebotomies as he gets vasovagal during the phlebotomy.  We will plan to get him back in another couple of months.  I think we can probably get  him through the wintertime now.    ______________________________ Josph Macho, M.D. PRE/MEDQ  D:  04/29/2012  T:  04/30/2012  Job:  [id number]

## 2012-05-28 ENCOUNTER — Ambulatory Visit (INDEPENDENT_AMBULATORY_CARE_PROVIDER_SITE_OTHER): Payer: Medicare Other | Admitting: Critical Care Medicine

## 2012-05-28 ENCOUNTER — Encounter: Payer: Self-pay | Admitting: Critical Care Medicine

## 2012-05-28 VITALS — BP 130/78 | HR 67 | Temp 98.2°F | Ht 67.0 in | Wt 214.0 lb

## 2012-05-28 NOTE — Patient Instructions (Signed)
No change in medications. Return in         6 months with PFTs

## 2012-05-28 NOTE — Progress Notes (Signed)
Subjective:    Patient ID: Donald Kaiser, male    DOB: [birth year], 79 y.o.   MRN: [id number]  HPI  79 y.o.WM Asthma hx.  05/28/2012 Doing well since last ov. No new problems. Pt had bronchitis in 1/14 but has recovered.  No real cough or mucus production now. No excess SABA use.  No GERD symptoms.  On Asmanex daily. Pt denies any significant sore throat, nasal congestion or excess secretions, fever, chills, sweats, unintended weight loss, pleurtic or exertional chest pain, orthopnea PND, or leg swelling Pt denies any increase in rescue therapy over baseline, denies waking up needing it or having any early am or nocturnal exacerbations of coughing/wheezing/or dyspnea. Pt also denies any obvious fluctuation in symptoms with  weather or environmental change or other alleviating or aggravating factors    PUL ASTHMA HISTORY 05/28/2012 11/25/2011 12/04/2010  Symptoms 0-2 days/week >2 days/week 0-2 days/week  Nighttime awakenings 0-2/month 0-2/month 0-2/month  Interference with activity Minor limitations Minor limitations Some limitations  SABA use 0-2 days/wk 0-2 days/wk 0-2 days/wk  Exacerbations requiring oral steroids 0-1 / year 0-1 / year 0-1 / year      Past Medical History  Diagnosis Date  . OSA (obstructive sleep apnea)   . GERD (gastroesophageal reflux disease)   . Diabetes mellitus type II   . Asthma   . DVT (deep venous thrombosis)     chronic  . Post-phlebitic syndrome   . Polycythemia vera     Ennever  . COPD (chronic obstructive pulmonary disease)   . Hypertension      Family History  Problem Relation Age of Onset  . Emphysema Sister   . Allergies Sister   . Asthma Sister   . Heart disease Brother   . Heart disease Brother   . Rheum arthritis Sister   . Rheum arthritis Mother   . Cancer Mother   . Cancer Sister   . Kidney failure Father   . Stroke Sister      History   Social History  . Marital Status: Married    Spouse Name: N/A    Number of  Children: N/A  . Years of Education: N/A   Occupational History  . Retired     Optician, dispensing   Social History Main Topics  . Smoking status: Never Smoker   . Smokeless tobacco: Never Used  . Alcohol Use: No  . Drug Use: No  . Sexually Active: Not on file   Other Topics Concern  . Not on file   Social History Narrative  . No narrative on file     No Known Allergies   Outpatient Prescriptions Prior to Visit  Medication Sig Dispense Refill  . albuterol-ipratropium (COMBIVENT) 18-103 MCG/ACT inhaler Inhale 1-2 puffs into the lungs every 4 (four) hours as needed.        [name] aspirin 81 MG tablet Take 81 mg by mouth daily.        . benzonatate (TESSALON) 100 MG capsule Take 1 capsule (100 mg total) by mouth 3 (three) times daily as needed for cough.  20 capsule  6  . dextromethorphan (DELSYM) 30 MG/5ML liquid 2 tsp every 12 hr as needed       . Ergocalciferol (VITAMIN D2) 2000 UNITS TABS Take 1 tablet by mouth daily.        [name] exenatide (BYETTA) 10 MCG/0.04ML SOLN Inject into the skin 2 (two) times daily.       . furosemide (LASIX) 40 MG tablet Take 20  mg by mouth 2 (two) times daily as needed.       . insulin NPH-insulin regular (NOVOLIN 70/30) (70-30) 100 UNIT/ML injection Inject into the skin daily with breakfast. 30 units in the morning And 8 units in evening      . losartan (COZAAR) 50 MG tablet Take 25 mg by mouth daily.        . metoprolol (LOPRESSOR) 50 MG tablet Take 50 mg by mouth daily.       . mometasone (ASMANEX 120 METERED DOSES) 220 MCG/INH inhaler Inhale 2 puffs into the lungs daily.       . mometasone (NASONEX) 50 MCG/ACT nasal spray Place 2 sprays into the nose daily as needed.       . montelukast (SINGULAIR) 10 MG tablet Take 10 mg by mouth daily.        . Multiple Vitamins-Minerals (PRESERVISION AREDS PO) Take 1 capsule by mouth 2 (two) times daily.        . nitroGLYCERIN (NITROSTAT) 0.4 MG SL tablet Place 0.4 mg under the tongue every 5 (five) minutes as needed.        .  simvastatin (ZOCOR) 40 MG tablet Take 40 mg by mouth at bedtime.        . traMADol (ULTRAM) 50 MG tablet Take 50 mg by mouth every 6 (six) hours as needed.      . travoprost, benzalkonium, (TRAVATAN) 0.004 % ophthalmic solution Place 1 drop into both eyes daily.        No facility-administered medications prior to visit.     Review of Systems  Constitutional:   No  weight loss, night sweats,  Fevers, chills, + fatigue, lassitude. HEENT:   No headaches,  Difficulty swallowing,  Tooth/dental problems,  Sore throat,                No sneezing, itching, ear ache, nasal congestion, post nasal drip,   CV:  No chest pain,  Orthopnea, PND, swelling in lower extremities, anasarca, dizziness, palpitations  GI  No heartburn, indigestion, abdominal pain, nausea, vomiting, diarrhea, change in bowel habits, loss of appetite  Resp: Notes  shortness of breath with exertion not  at rest.  No excess mucus, no productive cough,     No coughing up of blood.  No change in color of mucus.  No wheezing.  No chest wall deformity  Skin: no rash or lesions.  GU: no dysuria, change in color of urine, no urgency or frequency.  No flank pain.  MS:  No joint pain or swelling.  No decreased range of motion.  No back pain.  Psych:  No change in mood or affect. No depression or anxiety.  No memory loss. w    Objective:   Physical Exam   Filed Vitals:   05/28/12 1529  BP: 130/78  Pulse: 67  Temp: 98.2 F (36.8 C)  TempSrc: Oral  Height: 5\' 7"  (1.702 m)  Weight: 214 lb (97.07 kg)  SpO2: 99%    Gen: Pleasant, well-nourished, in no distress,  normal affect  ENT: No lesions,  mouth clear,  oropharynx clear,no postnasal drip  Neck: No JVD, no TMG, no carotid bruits  Lungs: No use of accessory muscles, no dullness to percussion,  Clear   Cardiovascular: RRR, heart sounds normal, no murmur or gallops, no peripheral edema  Abdomen: soft and NT, no HSM,  BS normal  Musculoskeletal: No deformities, no  cyanosis or clubbing  Neuro: alert, non focal  Skin: Warm, no  lesions or rashes     Assessment & Plan:    Moderate persistent asthma Moderate persistent asthma stable to this time Maintain inhaled medications as prescribed    Updated Medication List Outpatient Encounter Prescriptions as of 05/28/2012  Medication Sig Dispense Refill  . albuterol-ipratropium (COMBIVENT) 18-103 MCG/ACT inhaler Inhale 1-2 puffs into the lungs every 4 (four) hours as needed.        [name] aspirin 81 MG tablet Take 81 mg by mouth daily.        . benzonatate (TESSALON) 100 MG capsule Take 1 capsule (100 mg total) by mouth 3 (three) times daily as needed for cough.  20 capsule  6  . dextromethorphan (DELSYM) 30 MG/5ML liquid 2 tsp every 12 hr as needed       . Ergocalciferol (VITAMIN D2) 2000 UNITS TABS Take 1 tablet by mouth daily.        [name] exenatide (BYETTA) 10 MCG/0.04ML SOLN Inject into the skin 2 (two) times daily.       . furosemide (LASIX) 40 MG tablet Take 20 mg by mouth 2 (two) times daily as needed.       . insulin NPH-insulin regular (NOVOLIN 70/30) (70-30) 100 UNIT/ML injection Inject into the skin daily with breakfast. 30 units in the morning And 8 units in evening      . losartan (COZAAR) 50 MG tablet Take 25 mg by mouth daily.        . metoprolol (LOPRESSOR) 50 MG tablet Take 50 mg by mouth daily.       . mometasone (ASMANEX 120 METERED DOSES) 220 MCG/INH inhaler Inhale 2 puffs into the lungs daily.       . mometasone (NASONEX) 50 MCG/ACT nasal spray Place 2 sprays into the nose daily as needed.       . montelukast (SINGULAIR) 10 MG tablet Take 10 mg by mouth daily.        . Multiple Vitamins-Minerals (PRESERVISION AREDS PO) Take 1 capsule by mouth 2 (two) times daily.        . nitroGLYCERIN (NITROSTAT) 0.4 MG SL tablet Place 0.4 mg under the tongue every 5 (five) minutes as needed.        . simvastatin (ZOCOR) 40 MG tablet Take 40 mg by mouth at bedtime.        . traMADol (ULTRAM) 50 MG tablet  Take 50 mg by mouth every 6 (six) hours as needed.      . travoprost, benzalkonium, (TRAVATAN) 0.004 % ophthalmic solution Place 1 drop into both eyes daily.        No facility-administered encounter medications on file as of 05/28/2012.          Updated Medication List Outpatient Encounter Prescriptions as of 05/28/2012  Medication Sig Dispense Refill  . albuterol-ipratropium (COMBIVENT) 18-103 MCG/ACT inhaler Inhale 1-2 puffs into the lungs every 4 (four) hours as needed.        [name] aspirin 81 MG tablet Take 81 mg by mouth daily.        . benzonatate (TESSALON) 100 MG capsule Take 1 capsule (100 mg total) by mouth 3 (three) times daily as needed for cough.  20 capsule  6  . dextromethorphan (DELSYM) 30 MG/5ML liquid 2 tsp every 12 hr as needed       . Ergocalciferol (VITAMIN D2) 2000 UNITS TABS Take 1 tablet by mouth daily.        [name] exenatide (BYETTA) 10 MCG/0.04ML SOLN Inject into the skin 2 (two) times  daily.       . furosemide (LASIX) 40 MG tablet Take 20 mg by mouth 2 (two) times daily as needed.       . insulin NPH-insulin regular (NOVOLIN 70/30) (70-30) 100 UNIT/ML injection Inject into the skin daily with breakfast. 30 units in the morning And 8 units in evening      . losartan (COZAAR) 50 MG tablet Take 25 mg by mouth daily.        . metoprolol (LOPRESSOR) 50 MG tablet Take 50 mg by mouth daily.       . mometasone (ASMANEX 120 METERED DOSES) 220 MCG/INH inhaler Inhale 2 puffs into the lungs daily.       . mometasone (NASONEX) 50 MCG/ACT nasal spray Place 2 sprays into the nose daily as needed.       . montelukast (SINGULAIR) 10 MG tablet Take 10 mg by mouth daily.        . Multiple Vitamins-Minerals (PRESERVISION AREDS PO) Take 1 capsule by mouth 2 (two) times daily.        . nitroGLYCERIN (NITROSTAT) 0.4 MG SL tablet Place 0.4 mg under the tongue every 5 (five) minutes as needed.        . simvastatin (ZOCOR) 40 MG tablet Take 40 mg by mouth at bedtime.        . traMADol (ULTRAM)  50 MG tablet Take 50 mg by mouth every 6 (six) hours as needed.      . travoprost, benzalkonium, (TRAVATAN) 0.004 % ophthalmic solution Place 1 drop into both eyes daily.        No facility-administered encounter medications on file as of 05/28/2012.

## 2012-05-29 NOTE — Assessment & Plan Note (Signed)
Moderate persistent asthma stable to this time Maintain inhaled medications as prescribed

## 2012-06-23 ENCOUNTER — Other Ambulatory Visit: Payer: Self-pay | Admitting: Medical

## 2012-06-24 ENCOUNTER — Ambulatory Visit (HOSPITAL_BASED_OUTPATIENT_CLINIC_OR_DEPARTMENT_OTHER): Payer: Medicare Other | Admitting: Medical

## 2012-06-24 ENCOUNTER — Ambulatory Visit (HOSPITAL_BASED_OUTPATIENT_CLINIC_OR_DEPARTMENT_OTHER): Payer: Medicare Other

## 2012-06-24 ENCOUNTER — Other Ambulatory Visit (HOSPITAL_BASED_OUTPATIENT_CLINIC_OR_DEPARTMENT_OTHER): Payer: Medicare Other | Admitting: Lab

## 2012-06-24 VITALS — BP 135/69 | HR 73 | Temp 97.7°F | Resp 18 | Ht 67.0 in | Wt 216.0 lb

## 2012-06-24 VITALS — BP 153/88 | HR 72 | Temp 97.0°F

## 2012-06-24 DIAGNOSIS — D45 Polycythemia vera: Secondary | ICD-10-CM

## 2012-06-24 LAB — CBC WITH DIFFERENTIAL (CANCER CENTER ONLY)
BASO#: 0 10*3/uL (ref 0.0–0.2)
Eosinophils Absolute: 0.1 10*3/uL (ref 0.0–0.5)
HGB: 15.4 g/dL (ref 13.0–17.1)
LYMPH#: 1.2 10*3/uL (ref 0.9–3.3)
MCH: 29.8 pg (ref 28.0–33.4)
MONO%: 9.9 % (ref 0.0–13.0)
NEUT#: 3.8 10*3/uL (ref 1.5–6.5)
Platelets: 101 10*3/uL — ABNORMAL LOW (ref 145–400)
RBC: 5.17 10*6/uL (ref 4.20–5.70)
WBC: 5.8 10*3/uL (ref 4.0–10.0)

## 2012-06-24 LAB — FERRITIN: Ferritin: 22 ng/mL (ref 22–322)

## 2012-06-24 NOTE — Progress Notes (Signed)
Donald Kaiser presents today for phlebotomy per MD orders. Phlebotomy procedure started at 1112 and ended at 1128. 500 grams removed. Patient observed for 30 minutes after procedure without any incident. Patient tolerated procedure well. IV needle removed intact.  Patient discharged. Tolerated phlebotomy very well. Denies complaints

## 2012-06-24 NOTE — Patient Instructions (Addendum)
Therapeutic Phlebotomy Therapeutic phlebotomy is the controlled removal of blood from your body for the purpose of treating a medical condition. It is similar to donating blood. Usually, about a pint (470 mL) of blood is removed. The average adult has 9 to 12 pints (4.3 to 5.7 L) of blood. Therapeutic phlebotomy may be used to treat the following medical conditions:  Hemochromatosis. This is a condition in which there is too much iron in the blood.  Polycythemia vera. This is a condition in which there are too many red cells in the blood.  Porphyria cutanea tarda. This is a disease usually passed from one generation to the next (inherited). It is a condition in which an important part of hemoglobin is not made properly. This results in the build up of abnormal amounts of porphyrins in the body.  Sickle cell disease. This is an inherited disease. It is a condition in which the red blood cells form an abnormal crescent shape rather than a round shape. LET YOUR CAREGIVER KNOW ABOUT:  Allergies.  Medicines taken including herbs, eyedrops, over-the-counter medicines, and creams.  Use of steroids (by mouth or creams).  Previous problems with anesthetics or numbing medicine.  History of blood clots.  History of bleeding or blood problems.  Previous surgery.  Possibility of pregnancy, if this applies. RISKS AND COMPLICATIONS This is a simple and safe procedure. Problems are unlikely. However, problems can occur and may include:  Nausea or lightheadedness.  Low blood pressure.  Soreness, bleeding, swelling, or bruising at the needle insertion site.  Infection. BEFORE THE PROCEDURE  This is a procedure that can be done as an outpatient. Confirm the time that you need to arrive for your procedure. Confirm whether there is a need to fast or withhold any medications. It is helpful to wear clothing with sleeves that can be raised above the elbow. A blood sample may be done to determine the  amount of red blood cells or iron in your blood. Plan ahead of time to have someone drive you home after the procedure. PROCEDURE The entire procedure from preparation through recovery takes about 1 hour. The actual collection takes about 10 to 15 minutes.  A needle will be inserted into your vein.  Tubing and a collection bag will be attached to that needle.  Blood will flow through the needle and tubing into the collection bag.  You may be asked to open and close your hand slowly and continuously during the entire collection.  Once the specified amount of blood has been removed from your body, the collection bag and tubing will be clamped.  The needle will be removed.  Pressure will be held on the site of the needle insertion to stop the bleeding. Then a bandage will be placed over the needle insertion site. AFTER THE PROCEDURE  Your recovery will be assessed and monitored. If there are no problems, as an outpatient, you should be able to go home shortly after the procedure.  Document Released: 08/27/2010 Document Revised: 06/17/2011 Document Reviewed: 08/27/2010 [employer] Patient Information 2013 [employer].  

## 2012-06-24 NOTE — Progress Notes (Signed)
Diagnosis: Polycythemia vera.  Current therapy: Phlebotomy to maintain hematocrit less than 45%.  Interim history: Donald Kaiser presents today for an office followup visit.  His wife accompanies him.  Overall, he, reports, that he is doing relatively well.  He does have macular degeneration.  He is unable to drive.  We like to keep his hematocrit the low 45%.  Unfortunately, he does vasovagal with his phlebotomies.  His hematocrit is 46.0, today, as, such, we will need to phlebotomize him.  He, otherwise does not report any, headaches, excessive fatigue or new arthralgias or myalgias.  He does have back issues, however, this is chronic.  He, otherwise, states, he has a good appetite.  He denies any nausea, vomiting, diarrhea, constipation, chest pain, shortness of breath, or cough.  He denies any fevers, chills, her medications.  He denies any obvious, or abnormal bleeding.  He denies any headaches or rashes.  His last ferritin back on 04/29/2012 was 41.  Review of Systems: Constitutional:Negative for malaise/fatigue, fever, chills, weight loss, diaphoresis, activity change, appetite change, and unexpected weight change.  HEENT: Negative for double vision, blurred vision, visual loss, ear pain, tinnitus, congestion, rhinorrhea, epistaxis sore throat or sinus disease, oral pain/lesion, tongue soreness Respiratory: Negative for cough, chest tightness, shortness of breath, wheezing and stridor.  Cardiovascular: Negative for chest pain, palpitations, leg swelling, orthopnea, PND, DOE or claudication Gastrointestinal: Negative for nausea, vomiting, abdominal pain, diarrhea, constipation, blood in stool, melena, hematochezia, abdominal distention, anal bleeding, rectal pain, anorexia and hematemesis.  Genitourinary: Negative for dysuria, frequency, hematuria,  Musculoskeletal: Negative for myalgias, back pain, joint swelling, arthralgias and gait problem.  Skin: Negative for rash, color change, pallor and  wound.  Neurological:. Negative for dizziness/light-headedness, tremors, seizures, syncope, facial asymmetry, speech difficulty, weakness, numbness, headaches and paresthesias.  Hematological: Negative for adenopathy. Does not bruise/bleed easily.  Psychiatric/Behavioral:  Negative for depression, no loss of interest in normal activity or change in sleep pattern.   Physical Exam: This is a was an elderly, 79 year old, with well-developed, well-nourished, white gentleman , in no obvious distress Vitals: Temperature 97.7 degrees, pulse 73, respirations 18, blood pressure 135/69, weight 216 pounds HEENT reveals a normocephalic, atraumatic skull, no scleral icterus, no oral lesions  Neck is supple without any cervical or supraclavicular adenopathy.  Lungs are clear to auscultation bilaterally. There are no wheezes, rales or rhonci Cardiac is regular rate and rhythm with a normal S1 and S2. There are no murmurs, rubs, or bruits.  Abdomen is soft with good bowel sounds, there is no palpable mass. There is no palpable hepatosplenomegaly. There is no palpable fluid wave.  Musculoskeletal no tenderness of the spine, ribs, or hips.  Extremities there are no clubbing, cyanosis, or edema.  Skin no petechia, purpura or ecchymosis Neurologic is nonfocal.  Laboratory Data: White count 5.8, hemoglobin 15.4, hematocrit 46.0 platelets 101,000  Current Outpatient Prescriptions on File Prior to Visit  Medication Sig Dispense Refill  . albuterol-ipratropium (COMBIVENT) 18-103 MCG/ACT inhaler Inhale 1-2 puffs into the lungs every 4 (four) hours as needed.        [name] aspirin 81 MG tablet Take 81 mg by mouth daily.        . benzonatate (TESSALON) 100 MG capsule Take 1 capsule (100 mg total) by mouth 3 (three) times daily as needed for cough.  20 capsule  6  . dextromethorphan (DELSYM) 30 MG/5ML liquid 2 tsp every 12 hr as needed       . Ergocalciferol (VITAMIN D2) 2000 UNITS  TABS Take 1 tablet by mouth daily.         [name] exenatide (BYETTA) 10 MCG/0.04ML SOLN Inject into the skin 2 (two) times daily.       . furosemide (LASIX) 40 MG tablet Take 20 mg by mouth 2 (two) times daily as needed.       . insulin NPH-insulin regular (NOVOLIN 70/30) (70-30) 100 UNIT/ML injection Inject into the skin daily with breakfast. 30 units in the morning And 8 units in evening      . losartan (COZAAR) 50 MG tablet Take 25 mg by mouth daily.        . metoprolol (LOPRESSOR) 50 MG tablet Take 50 mg by mouth daily.       . mometasone (ASMANEX 120 METERED DOSES) 220 MCG/INH inhaler Inhale 2 puffs into the lungs daily.       . mometasone (NASONEX) 50 MCG/ACT nasal spray Place 2 sprays into the nose daily as needed.       . montelukast (SINGULAIR) 10 MG tablet Take 10 mg by mouth daily.        . Multiple Vitamins-Minerals (PRESERVISION AREDS PO) Take 1 capsule by mouth 2 (two) times daily.        . nitroGLYCERIN (NITROSTAT) 0.4 MG SL tablet Place 0.4 mg under the tongue every 5 (five) minutes as needed.        . simvastatin (ZOCOR) 40 MG tablet Take 40 mg by mouth at bedtime.        . traMADol (ULTRAM) 50 MG tablet Take 50 mg by mouth every 6 (six) hours as needed.      . travoprost, benzalkonium, (TRAVATAN) 0.004 % ophthalmic solution Place 1 drop into both eyes daily.        No current facility-administered medications on file prior to visit.   Assessment/Plan: This is a pleasant, 79 year old, white gentleman, with the following issues:  #1.  Polycythemia vera.  Donald Kaiser, hematocrit is 46.0 today, as, such, we'll go ahead and phlebotomize him.  #2.  Followup.  We will follow back up with Donald Kaiser in 8 weeks, but before then should there be questions or concerns.

## 2012-08-24 ENCOUNTER — Ambulatory Visit (HOSPITAL_BASED_OUTPATIENT_CLINIC_OR_DEPARTMENT_OTHER): Payer: Medicare Other | Admitting: Medical

## 2012-08-24 ENCOUNTER — Ambulatory Visit (HOSPITAL_BASED_OUTPATIENT_CLINIC_OR_DEPARTMENT_OTHER): Payer: Medicare Other

## 2012-08-24 ENCOUNTER — Other Ambulatory Visit (HOSPITAL_BASED_OUTPATIENT_CLINIC_OR_DEPARTMENT_OTHER): Payer: Medicare Other | Admitting: Lab

## 2012-08-24 VITALS — BP 132/63 | HR 65 | Temp 97.8°F | Resp 18 | Ht 67.0 in | Wt 215.0 lb

## 2012-08-24 VITALS — BP 131/78 | HR 58 | Resp 20

## 2012-08-24 DIAGNOSIS — D45 Polycythemia vera: Secondary | ICD-10-CM

## 2012-08-24 LAB — CBC WITH DIFFERENTIAL (CANCER CENTER ONLY)
BASO#: 0 10*3/uL (ref 0.0–0.2)
EOS%: 1.8 % (ref 0.0–7.0)
HGB: 15 g/dL (ref 13.0–17.1)
MCH: 30 pg (ref 28.0–33.4)
MCHC: 33 g/dL (ref 32.0–35.9)
MONO%: 7.8 % (ref 0.0–13.0)
NEUT#: 4.2 10*3/uL (ref 1.5–6.5)

## 2012-08-24 NOTE — Progress Notes (Signed)
Donald Kaiser presents today for phlebotomy per MD orders. Phlebotomy procedure started at 1445 and ended at 1510. Cannulated x 2 d/t needle clotting.  Approximately removed. Patient observed for 30 minutes after procedure without any incident. Patient tolerated procedure well. IV needle removed intact.

## 2012-08-24 NOTE — Progress Notes (Signed)
Diagnosis: Polycythemia vera.  Current therapy: Phlebotomy to maintain hematocrit less than 45%.  Interim history: [name] presents today for an office followup visit.  His wife accompanies him.  Overall, he, reports, that he is doing relatively well.  He does have macular degeneration.  He is unable to drive.  We like to keep his hematocrit the low 45%.  Unfortunately, he does vasovagal with his phlebotomies.  We have started slowing down his phlebotomies quite a bit and he is done quite well.  His hematocrit is 45.4, today, as, such, we will need to phlebotomize him.  He, otherwise does not report any, headaches, excessive fatigue or new arthralgias or myalgias.  He does have back issues, however, this is chronic.  He, otherwise, states, he has a good appetite.  He denies any nausea, vomiting, diarrhea, constipation, chest pain, shortness of breath, or cough.  He denies any fevers, chills, her medications.  He denies any obvious, or abnormal bleeding.  He denies any headaches or rashes.  His last ferritin back on 04/29/2012 was 41.  Review of Systems: Constitutional:Negative for malaise/fatigue, fever, chills, weight loss, diaphoresis, activity change, appetite change, and unexpected weight change.  HEENT: Negative for double vision, blurred vision, visual loss, ear pain, tinnitus, congestion, rhinorrhea, epistaxis sore throat or sinus disease, oral pain/lesion, tongue soreness Respiratory: Negative for cough, chest tightness, shortness of breath, wheezing and stridor.  Cardiovascular: Negative for chest pain, palpitations, leg swelling, orthopnea, PND, DOE or claudication Gastrointestinal: Negative for nausea, vomiting, abdominal pain, diarrhea, constipation, blood in stool, melena, hematochezia, abdominal distention, anal bleeding, rectal pain, anorexia and hematemesis.  Genitourinary: Negative for dysuria, frequency, hematuria,  Musculoskeletal: Negative for myalgias, back pain, joint  swelling, arthralgias and gait problem.  Skin: Negative for rash, color change, pallor and wound.  Neurological:. Negative for dizziness/light-headedness, tremors, seizures, syncope, facial asymmetry, speech difficulty, weakness, numbness, headaches and paresthesias.  Hematological: Negative for adenopathy. Does not bruise/bleed easily.  Psychiatric/Behavioral:  Negative for depression, no loss of interest in normal activity or change in sleep pattern.   Physical Exam: This is a was an elderly, 79 year old, with well-developed, well-nourished, white gentleman , in no obvious distress Vitals: 97.8 pulse 65 respirations 18 blood pressure 132/63 weight 215 pounds HEENT reveals a normocephalic, atraumatic skull, no scleral icterus, no oral lesions  Neck is supple without any cervical or supraclavicular adenopathy.  Lungs are clear to auscultation bilaterally. There are no wheezes, rales or rhonci Cardiac is regular rate and rhythm with a normal S1 and S2. There are no murmurs, rubs, or bruits.  Abdomen is soft with good bowel sounds, there is no palpable mass. There is no palpable hepatosplenomegaly. There is no palpable fluid wave.  Musculoskeletal no tenderness of the spine, ribs, or hips.  Extremities there are no clubbing, cyanosis, or edema.  Skin no petechia, purpura or ecchymosis Neurologic is nonfocal.  Laboratory Data: White count 6.0 hemoglobin 15.0 hematocrit 45.4 platelets 97,000  Current Outpatient Prescriptions on File Prior to Visit  Medication Sig Dispense Refill  . albuterol-ipratropium (COMBIVENT) 18-103 MCG/ACT inhaler Inhale 1-2 puffs into the lungs every 4 (four) hours as needed.        [name] aspirin 81 MG tablet Take 81 mg by mouth daily.        . benzonatate (TESSALON) 100 MG capsule Take 1 capsule (100 mg total) by mouth 3 (three) times daily as needed for cough.  20 capsule  6  . dextromethorphan (DELSYM) 30 MG/5ML liquid 2 tsp every 12  hr as needed       . Ergocalciferol  (VITAMIN D2) 2000 UNITS TABS Take 1 tablet by mouth daily.        [name] exenatide (BYETTA) 10 MCG/0.04ML SOLN Inject into the skin 2 (two) times daily.       . furosemide (LASIX) 40 MG tablet Take 20 mg by mouth 2 (two) times daily as needed.       . insulin NPH-insulin regular (NOVOLIN 70/30) (70-30) 100 UNIT/ML injection Inject into the skin daily with breakfast. 30 units in the morning And 8 units in evening      . losartan (COZAAR) 50 MG tablet Take 25 mg by mouth daily.        . metoprolol (LOPRESSOR) 50 MG tablet Take 50 mg by mouth daily.       . mometasone (ASMANEX 120 METERED DOSES) 220 MCG/INH inhaler Inhale 2 puffs into the lungs daily.       . mometasone (NASONEX) 50 MCG/ACT nasal spray Place 2 sprays into the nose daily as needed.       . montelukast (SINGULAIR) 10 MG tablet Take 10 mg by mouth daily.        . Multiple Vitamins-Minerals (PRESERVISION AREDS PO) Take 1 capsule by mouth 2 (two) times daily.        . nitroGLYCERIN (NITROSTAT) 0.4 MG SL tablet Place 0.4 mg under the tongue every 5 (five) minutes as needed.        . simvastatin (ZOCOR) 40 MG tablet Take 40 mg by mouth at bedtime.        . traMADol (ULTRAM) 50 MG tablet Take 50 mg by mouth every 6 (six) hours as needed.      . travoprost, benzalkonium, (TRAVATAN) 0.004 % ophthalmic solution Place 1 drop into both eyes daily.        No current facility-administered medications on file prior to visit.   Assessment/Plan: This is a pleasant, 79 year old, white gentleman, with the following issues:  #1.  Polycythemia vera.  [name]'s hematocrit is right on the borderline at 45%.  We will go ahead and phlebotomize him today.  #2.  Followup.  We will follow back up with [name] in 8 weeks, but before then should there be questions or concerns.

## 2012-08-24 NOTE — Patient Instructions (Signed)

## 2012-09-14 ENCOUNTER — Ambulatory Visit: Payer: Medicare Other | Admitting: Hematology & Oncology

## 2012-09-14 ENCOUNTER — Other Ambulatory Visit: Payer: Medicare Other | Admitting: Lab

## 2012-10-16 ENCOUNTER — Ambulatory Visit: Payer: Medicare Other

## 2012-10-16 ENCOUNTER — Other Ambulatory Visit (HOSPITAL_BASED_OUTPATIENT_CLINIC_OR_DEPARTMENT_OTHER): Payer: Medicare Other | Admitting: Lab

## 2012-10-16 ENCOUNTER — Ambulatory Visit (HOSPITAL_BASED_OUTPATIENT_CLINIC_OR_DEPARTMENT_OTHER): Payer: Medicare Other | Admitting: Hematology & Oncology

## 2012-10-16 VITALS — BP 138/58 | HR 67 | Temp 98.1°F | Resp 18 | Ht 67.0 in | Wt 218.0 lb

## 2012-10-16 DIAGNOSIS — D45 Polycythemia vera: Secondary | ICD-10-CM

## 2012-10-16 LAB — CBC WITH DIFFERENTIAL (CANCER CENTER ONLY)
BASO%: 0.2 % (ref 0.0–2.0)
EOS%: 1.9 % (ref 0.0–7.0)
LYMPH%: 22 % (ref 14.0–48.0)
MCH: 30.1 pg (ref 28.0–33.4)
MCV: 91 fL (ref 82–98)
MONO#: 0.7 10*3/uL (ref 0.1–0.9)
MONO%: 10.6 % (ref 0.0–13.0)
Platelets: 99 10*3/uL — ABNORMAL LOW (ref 145–400)
RDW: 14 % (ref 11.1–15.7)
WBC: 6.2 10*3/uL (ref 4.0–10.0)

## 2012-10-16 LAB — FERRITIN CHCC: Ferritin: 24 ng/ml (ref 22–316)

## 2012-10-16 NOTE — Progress Notes (Unsigned)
No phlebotomy needed per Dr Myna Hidalgo.

## 2012-10-16 NOTE — Progress Notes (Signed)
This office note has been dictated.

## 2012-10-18 NOTE — Progress Notes (Signed)
CC:   Donald Pounds, MD  DIAGNOSIS:  Polycythemia vera.  CURRENT THERAPY:  Phlebotomy to maintain hematocrit less than 45%.  INTERIM HISTORY:  [name] comes in for followup.  He was last seen about 2 months ago.  His wife, he reports, is not doing too well.  He is slowing down himself.  He has had no chest pain.  He does state some slight, intermittent sharp discomfort in the left upper chest.  This does not radiate.  He has had no problems with abdominal pain.  There has been some occasional leg swelling.  His last ferritin was 124 back in May.  He has had no cardiac issues.  He has had no problems with blood sugars. He said his last hemoglobin A1c was 6.  PHYSICAL EXAM:  General:  This is a mildly obese white gentleman in no obvious distress.  Vital signs:  Temperature of 98.1, pulse 67, respiratory rate 18, blood pressure 138/58.  Weight is 218.  Head and neck:  Normocephalic, atraumatic skull.  There are no ocular or oral lesions.  There are no palpable cervical or supraclavicular lymph nodes. Lungs:  Clear bilaterally.  Cardiac:  Regular rate and rhythm with a normal S1, S2.  There are no murmurs, rubs, or bruits.  Abdomen:  Soft with good bowel sounds.  There is no palpable abdominal mass.  There is no fluid wave.  There is no palpable hepatosplenomegaly.  Back:  No tenderness over the spine, ribs, or hips.  Extremities:  Some trace edema in his legs.  Neurological:  No focal neurological deficit.  LABORATORY STUDIES:  White cell count is 6.2, hemoglobin 15.3, hematocrit 46, platelet count 99,000.  IMPRESSION:  [name] is an 79 year old gentleman with polycythemia.  He does have chronic thrombocytopenia which has never been an issue for him.  He has always been asymptomatic with this.  I think we can hold off on his phlebotomy right now.  He is pretty much asymptomatic with this.  He does have his other health issues.  His wife is not doing too well, and he  really wants to try to be home with her.  I think we can probably get him back in about 6 weeks' time.  By then, I suspect that he might need to be phlebotomized.   ______________________________ Donald Kaiser, M.D. PRE/MEDQ  D:  10/16/2012  T:  10/18/2012  Job:  [id number]

## 2012-11-27 ENCOUNTER — Ambulatory Visit (HOSPITAL_BASED_OUTPATIENT_CLINIC_OR_DEPARTMENT_OTHER): Payer: Medicare Other | Admitting: Hematology & Oncology

## 2012-11-27 ENCOUNTER — Other Ambulatory Visit (HOSPITAL_BASED_OUTPATIENT_CLINIC_OR_DEPARTMENT_OTHER): Payer: Medicare Other | Admitting: Lab

## 2012-11-27 ENCOUNTER — Ambulatory Visit (HOSPITAL_BASED_OUTPATIENT_CLINIC_OR_DEPARTMENT_OTHER): Payer: Medicare Other

## 2012-11-27 VITALS — BP 140/71 | HR 70 | Temp 97.9°F | Resp 16 | Ht 67.0 in | Wt 216.0 lb

## 2012-11-27 VITALS — BP 119/72 | HR 63

## 2012-11-27 DIAGNOSIS — D45 Polycythemia vera: Secondary | ICD-10-CM

## 2012-11-27 DIAGNOSIS — D696 Thrombocytopenia, unspecified: Secondary | ICD-10-CM

## 2012-11-27 LAB — CBC WITH DIFFERENTIAL (CANCER CENTER ONLY)
BASO%: 0.1 % (ref 0.0–2.0)
EOS%: 1.9 % (ref 0.0–7.0)
LYMPH#: 1.2 10*3/uL (ref 0.9–3.3)
MCHC: 32.9 g/dL (ref 32.0–35.9)
NEUT#: 4.9 10*3/uL (ref 1.5–6.5)
Platelets: 94 10*3/uL — ABNORMAL LOW (ref 145–400)
RDW: 13.8 % (ref 11.1–15.7)

## 2012-11-27 NOTE — Progress Notes (Signed)
This office note has been dictated.

## 2012-11-27 NOTE — Patient Instructions (Addendum)

## 2012-11-27 NOTE — Progress Notes (Signed)
Donald Kaiser presents today for phlebotomy per MD orders. Phlebotomy procedure started at 1053 and ended at 1115 500 grams removed. Patient observed for 30 minutes after procedure without any incident. Patient tolerated procedure well. IV needle removed intact.

## 2012-11-28 NOTE — Progress Notes (Signed)
CC:   Donald Pounds, MD  DIAGNOSIS:  Polycythemia vera.  CURRENT THERAPY:  Phlebotomy to maintain hematocrit less than 45%.  INTERIM HISTORY:  Donald Kaiser comes in for his followup.  We last saw him a month ago.  He is doing okay.  His overall performance status is probably ECOG 3.  He has had no problems with chest pain.  He has had no shortness of breath.  He has chronic back issues.  He has had no headache.  He has had no rashes.  He has not noted any dizziness.  He can maintain his "plethora" of medications.  When we last saw him, his ferritin was 24.  PHYSICAL EXAM:  General:  This is an elderly white gentleman in no obvious distress.  Vital Signs:  Show a temperature of 97.9, pulse 70, respiratory rate 16.  Blood pressure is 140/71.  Weight is 216.  Head and Neck:  Show a normocephalic, atraumatic skull.  There are no ocular or oral lesions.  There are no palpable cervical or supraclavicular lymph nodes.  Lungs:  Clear bilaterally.  Cardiac:  Regular rate and rhythm with a normal S1 and S2.  There are no murmurs, rubs, or bruits. Abdomen:  Soft.  He has good bowel sounds.  He is mildly obese.  He has no fluid wave.  No palpable hepatosplenomegaly.  Extremities:  Show some trace edema in his legs.  LABORATORY STUDIES:  White cell count is 6.9.  Hemoglobin 15.9; hematocrit 48.3; platelet count 94,000.  IMPRESSION:  Donald Kaiser is an 79 year old gentleman with polycythemia vera.  He has chronic thrombocytopenia.  I think that the thrombocytopenia is more related to his medications than anything else. He has been asymptomatic with the thrombocytopenia.  We will go ahead and plan to get him back in about 2 months now.  We will go ahead and phlebotomize him today.    ______________________________ Donald Kaiser, M.D. PRE/MEDQ  D:  11/27/2012  T:  11/28/2012  Job:  [id number]

## 2012-12-01 ENCOUNTER — Encounter: Payer: Self-pay | Admitting: Critical Care Medicine

## 2012-12-01 ENCOUNTER — Ambulatory Visit (INDEPENDENT_AMBULATORY_CARE_PROVIDER_SITE_OTHER): Payer: Medicare Other | Admitting: Critical Care Medicine

## 2012-12-01 VITALS — BP 120/86 | HR 67 | Temp 98.0°F | Ht 65.5 in | Wt 216.0 lb

## 2012-12-01 DIAGNOSIS — J45909 Unspecified asthma, uncomplicated: Secondary | ICD-10-CM

## 2012-12-01 NOTE — Progress Notes (Signed)
Spirometry before and after done today. 

## 2012-12-01 NOTE — Patient Instructions (Addendum)
No change in medications. Return in        4 months Remember flu vaccine

## 2012-12-02 NOTE — Assessment & Plan Note (Signed)
Moderate persistent asthma stable at this time with normal spirometry on today's visit Plan Maintain inhaled medications as prescribed

## 2012-12-02 NOTE — Progress Notes (Signed)
Subjective:    Patient ID: Donald Kaiser, male    DOB: Sep 28, 1926, 79 y.o.   MRN: [id number]  HPI 79 y.o.WM Asthma hx.  PUL ASTHMA HISTORY 12/02/2012 05/28/2012 11/25/2011 12/04/2010  Symptoms 0-2 days/week 0-2 days/week >2 days/week 0-2 days/week  Nighttime awakenings 0-2/month 0-2/month 0-2/month 0-2/month  Interference with activity No limitations Minor limitations Minor limitations Some limitations  SABA use 0-2 days/wk 0-2 days/wk 0-2 days/wk 0-2 days/wk  Exacerbations requiring oral steroids 0-1 / year 0-1 / year 0-1 / year 0-1 / year  12/01/12 Chief Complaint  Patient presents with  . Asthma    Breathing is unchanged. Reports SOB, chest pain. Denies coughing or wheezing.   Since the last visit the patient has done well without change in breathing. There is some dyspnea with exertion but no chest discomfort or wheezing. There is no nocturnal symptoms. The patient returns for followup  Pt denies any significant sore throat, nasal congestion or excess secretions, fever, chills, sweats, unintended weight loss, pleurtic or exertional chest pain, orthopnea PND, or leg swelling Pt denies any increase in rescue therapy over baseline, denies waking up needing it or having any early am or nocturnal exacerbations of coughing/wheezing/or dyspnea. Pt also denies any obvious fluctuation in symptoms with  weather or environmental change or other alleviating or aggravating factors      Past Medical History  Diagnosis Date  . OSA (obstructive sleep apnea)   . GERD (gastroesophageal reflux disease)   . Diabetes mellitus type II   . Asthma   . DVT (deep venous thrombosis)     chronic  . Post-phlebitic syndrome   . Polycythemia vera(238.4)     Ennever  . COPD (chronic obstructive pulmonary disease)   . Hypertension      Family History  Problem Relation Age of Onset  . Emphysema Sister   . Allergies Sister   . Asthma Sister   . Heart disease Brother   . Heart disease Brother   .  Rheum arthritis Sister   . Rheum arthritis Mother   . Cancer Mother   . Cancer Sister   . Kidney failure Father   . Stroke Sister      History   Social History  . Marital Status: Married    Spouse Name: N/A    Number of Children: N/A  . Years of Education: N/A   Occupational History  . Retired     Optician, dispensing   Social History Main Topics  . Smoking status: Never Smoker   . Smokeless tobacco: Never Used  . Alcohol Use: No  . Drug Use: No  . Sexual Activity: Not on file   Other Topics Concern  . Not on file   Social History Narrative  . No narrative on file     No Known Allergies   Outpatient Prescriptions Prior to Visit  Medication Sig Dispense Refill  . albuterol-ipratropium (COMBIVENT) 18-103 MCG/ACT inhaler Inhale 1-2 puffs into the lungs every 4 (four) hours as needed.        [name] aspirin 81 MG tablet Take 81 mg by mouth daily.        . benzonatate (TESSALON) 100 MG capsule Take 100 mg by mouth as needed for cough.      . desonide (DESOWEN) 0.05 % cream Apply topically as needed.       . Ergocalciferol (VITAMIN D2) 2000 UNITS TABS Take 1 tablet by mouth daily.        [name] exenatide (BYETTA) 10 MCG/0.04ML SOLN  Inject into the skin every morning.       . fluocinonide (LIDEX) 0.05 % external solution Apply topically as needed.       . furosemide (LASIX) 40 MG tablet Take 20 mg by mouth 2 (two) times daily as needed.       . insulin NPH-insulin regular (NOVOLIN 70/30) (70-30) 100 UNIT/ML injection Inject into the skin daily with breakfast. 25 units in the morning And 5 units in evening      . metoprolol (LOPRESSOR) 50 MG tablet Take 50 mg by mouth daily.       . mometasone (ASMANEX 120 METERED DOSES) 220 MCG/INH inhaler Inhale 2 puffs into the lungs daily.       . mometasone (NASONEX) 50 MCG/ACT nasal spray Place 2 sprays into the nose daily as needed.       . montelukast (SINGULAIR) 10 MG tablet Take 10 mg by mouth daily.        . Multiple Vitamins-Minerals (PRESERVISION  AREDS PO) Take 1 capsule by mouth 2 (two) times daily.        . nitroGLYCERIN (NITROSTAT) 0.4 MG SL tablet Place 0.4 mg under the tongue every 5 (five) minutes as needed.        . simvastatin (ZOCOR) 40 MG tablet Take 40 mg by mouth at bedtime.        . traMADol (ULTRAM) 50 MG tablet Take 50 mg by mouth every 6 (six) hours as needed.      . travoprost, benzalkonium, (TRAVATAN) 0.004 % ophthalmic solution Place 1 drop into both eyes daily.        No facility-administered medications prior to visit.     Review of Systems Constitutional:   No  weight loss, night sweats,  Fevers, chills, + fatigue, lassitude. HEENT:   No headaches,  Difficulty swallowing,  Tooth/dental problems,  Sore throat,                No sneezing, itching, ear ache, nasal congestion, post nasal drip,   CV:  No chest pain,  Orthopnea, PND, swelling in lower extremities, anasarca, dizziness, palpitations  GI  No heartburn, indigestion, abdominal pain, nausea, vomiting, diarrhea, change in bowel habits, loss of appetite  Resp: Notes  shortness of breath with exertion not  at rest.  No excess mucus, no productive cough,     No coughing up of blood.  No change in color of mucus.  No wheezing.  No chest wall deformity  Skin: no rash or lesions.  GU: no dysuria, change in color of urine, no urgency or frequency.  No flank pain.  MS:  No joint pain or swelling.  No decreased range of motion.  No back pain.  Psych:  No change in mood or affect. No depression or anxiety.  No memory loss. w    Objective:   Physical Exam   Filed Vitals:   12/01/12 1600  BP: 120/86  Pulse: 67  Temp: 98 F (36.7 C)  TempSrc: Oral  Height: 5' 5.5" (1.664 m)  Weight: 216 lb (97.977 kg)  SpO2: 97%    Gen: Pleasant, well-nourished, in no distress,  normal affect  ENT: No lesions,  mouth clear,  oropharynx clear,no postnasal drip  Neck: No JVD, no TMG, no carotid bruits  Lungs: No use of accessory muscles, no dullness to  percussion,  Clear   Cardiovascular: RRR, heart sounds normal, no murmur or gallops, no peripheral edema  Abdomen: soft and NT, no HSM,  BS normal  Musculoskeletal: No deformities, no cyanosis or clubbing  Neuro: alert, non focal  Skin: Warm, no lesions or rashes     Assessment & Plan:    Moderate persistent asthma Moderate persistent asthma stable at this time with normal spirometry on today's visit Plan Maintain inhaled medications as prescribed     Updated Medication List Outpatient Encounter Prescriptions as of 12/01/2012  Medication Sig Dispense Refill  . albuterol-ipratropium (COMBIVENT) 18-103 MCG/ACT inhaler Inhale 1-2 puffs into the lungs every 4 (four) hours as needed.        [name] aspirin 81 MG tablet Take 81 mg by mouth daily.        . benzonatate (TESSALON) 100 MG capsule Take 100 mg by mouth as needed for cough.      . desonide (DESOWEN) 0.05 % cream Apply topically as needed.       . Ergocalciferol (VITAMIN D2) 2000 UNITS TABS Take 1 tablet by mouth daily.        [name] exenatide (BYETTA) 10 MCG/0.04ML SOLN Inject into the skin every morning.       . fluocinonide (LIDEX) 0.05 % external solution Apply topically as needed.       . furosemide (LASIX) 40 MG tablet Take 20 mg by mouth 2 (two) times daily as needed.       . insulin NPH-insulin regular (NOVOLIN 70/30) (70-30) 100 UNIT/ML injection Inject into the skin daily with breakfast. 25 units in the morning And 5 units in evening      . metoprolol (LOPRESSOR) 50 MG tablet Take 50 mg by mouth daily.       . mometasone (ASMANEX 120 METERED DOSES) 220 MCG/INH inhaler Inhale 2 puffs into the lungs daily.       . mometasone (NASONEX) 50 MCG/ACT nasal spray Place 2 sprays into the nose daily as needed.       . montelukast (SINGULAIR) 10 MG tablet Take 10 mg by mouth daily.        . Multiple Vitamins-Minerals (PRESERVISION AREDS PO) Take 1 capsule by mouth 2 (two) times daily.        . nitroGLYCERIN (NITROSTAT) 0.4 MG SL tablet  Place 0.4 mg under the tongue every 5 (five) minutes as needed.        . simvastatin (ZOCOR) 40 MG tablet Take 40 mg by mouth at bedtime.        . traMADol (ULTRAM) 50 MG tablet Take 50 mg by mouth every 6 (six) hours as needed.      . travoprost, benzalkonium, (TRAVATAN) 0.004 % ophthalmic solution Place 1 drop into both eyes daily.        No facility-administered encounter medications on file as of 12/01/2012.          Updated Medication List Outpatient Encounter Prescriptions as of 12/01/2012  Medication Sig Dispense Refill  . albuterol-ipratropium (COMBIVENT) 18-103 MCG/ACT inhaler Inhale 1-2 puffs into the lungs every 4 (four) hours as needed.        [name] aspirin 81 MG tablet Take 81 mg by mouth daily.        . benzonatate (TESSALON) 100 MG capsule Take 100 mg by mouth as needed for cough.      . desonide (DESOWEN) 0.05 % cream Apply topically as needed.       . Ergocalciferol (VITAMIN D2) 2000 UNITS TABS Take 1 tablet by mouth daily.        [name] exenatide (BYETTA) 10 MCG/0.04ML SOLN Inject into the skin every morning.       [name]  fluocinonide (LIDEX) 0.05 % external solution Apply topically as needed.       . furosemide (LASIX) 40 MG tablet Take 20 mg by mouth 2 (two) times daily as needed.       . insulin NPH-insulin regular (NOVOLIN 70/30) (70-30) 100 UNIT/ML injection Inject into the skin daily with breakfast. 25 units in the morning And 5 units in evening      . metoprolol (LOPRESSOR) 50 MG tablet Take 50 mg by mouth daily.       . mometasone (ASMANEX 120 METERED DOSES) 220 MCG/INH inhaler Inhale 2 puffs into the lungs daily.       . mometasone (NASONEX) 50 MCG/ACT nasal spray Place 2 sprays into the nose daily as needed.       . montelukast (SINGULAIR) 10 MG tablet Take 10 mg by mouth daily.        . Multiple Vitamins-Minerals (PRESERVISION AREDS PO) Take 1 capsule by mouth 2 (two) times daily.        . nitroGLYCERIN (NITROSTAT) 0.4 MG SL tablet Place 0.4 mg under the tongue every 5  (five) minutes as needed.        . simvastatin (ZOCOR) 40 MG tablet Take 40 mg by mouth at bedtime.        . traMADol (ULTRAM) 50 MG tablet Take 50 mg by mouth every 6 (six) hours as needed.      . travoprost, benzalkonium, (TRAVATAN) 0.004 % ophthalmic solution Place 1 drop into both eyes daily.        No facility-administered encounter medications on file as of 12/01/2012.

## 2013-01-21 ENCOUNTER — Ambulatory Visit (HOSPITAL_BASED_OUTPATIENT_CLINIC_OR_DEPARTMENT_OTHER): Payer: Medicare Other | Admitting: Lab

## 2013-01-21 ENCOUNTER — Ambulatory Visit (HOSPITAL_BASED_OUTPATIENT_CLINIC_OR_DEPARTMENT_OTHER): Payer: Medicare Other

## 2013-01-21 ENCOUNTER — Ambulatory Visit (HOSPITAL_BASED_OUTPATIENT_CLINIC_OR_DEPARTMENT_OTHER): Payer: Medicare Other | Admitting: Hematology & Oncology

## 2013-01-21 DIAGNOSIS — D696 Thrombocytopenia, unspecified: Secondary | ICD-10-CM

## 2013-01-21 DIAGNOSIS — D45 Polycythemia vera: Secondary | ICD-10-CM

## 2013-01-21 LAB — CBC WITH DIFFERENTIAL (CANCER CENTER ONLY)
BASO#: 0 10*3/uL (ref 0.0–0.2)
Eosinophils Absolute: 0.1 10*3/uL (ref 0.0–0.5)
HCT: 47.8 % (ref 38.7–49.9)
HGB: 16 g/dL (ref 13.0–17.1)
LYMPH#: 1.2 10*3/uL (ref 0.9–3.3)
MONO#: 0.6 10*3/uL (ref 0.1–0.9)
NEUT#: 3.7 10*3/uL (ref 1.5–6.5)
RBC: 5.45 10*6/uL (ref 4.20–5.70)

## 2013-01-21 NOTE — Progress Notes (Signed)
This office note has been dictated.

## 2013-01-22 NOTE — Progress Notes (Signed)
CC:   [name], MD  DIAGNOSIS:  Polycythemia vera.  CURRENT THERAPY:  Phlebotomy to maintain hematocrit less than 45%.  INTERIM HISTORY:  Mr. Stonehocker comes in for his followup.  He is still managing fairly well.  He and his wife are both pretty feeble.  However, they do seem to do well together.  He has had some problems with the right hip.  He says there is pain down his legs.  He has serious arthritis.  He probably has some sciatia issues.  He sees Dr. [name], his family doctor, in a few weeks.  He has not had any issues with his heart or with diabetes.  He continues on his allotment of medications.  He has had no fevers.  He has had no cough.  He has had no change in bowel or bladder habits.  When we last checked his ferritin back in July, his ferritin was 24.  PHYSICAL EXAM:  General:  This is an elderly white gentleman in no obvious distress.  Vital signs:  Temperature of 97.4, pulse 70, respiratory rate 14, blood pressure 157/69.  Weight is 218 pounds.  Head and neck:  Normocephalic, atraumatic skull.  There are no ocular or oral lesions.  There are no palpable cervical or supraclavicular lymph nodes. Lungs:  Clear bilaterally.  Cardiac:  Regular rate and rhythm with a normal S1 and S2.  There are no murmurs, rubs, or bruits.  Abdomen: Soft.  He is mildly abuse.  He has decent bowel sounds.  There is no fluid wave.  There is no palpable hepatosplenomegaly.  Back:  No tenderness over the spine, ribs, or hips.  He does have limited range of motion with respect to the right hip area.  Extremities:  Show some chronic nonpitting edema of the lower legs.  Skin:  No rashes.  LABORATORY STUDIES:  White cell count 5.6, hemoglobin 16, hematocrit 47.8, platelet count 104.  IMPRESSION:  Mr. Norden is a nice 79 year old gentleman.  He has polycythemia.  He also has thrombocytopenia.  The thrombocytopenia has been chronic.  I suspect that probably is from one of his  medications. He has not had any issues with this, and as such, we have not needed to do any invasive workup for it.  We will go ahead and get him phlebotomized.  We will plan to get him back in 2 more months.  This usually is good timeframe for him.   ______________________________ Josph Macho, M.D. PRE/MEDQ  D:  01/21/2013  T:  01/22/2013  Job:  [id number]

## 2013-03-23 ENCOUNTER — Ambulatory Visit: Payer: Medicare Other

## 2013-03-23 ENCOUNTER — Ambulatory Visit (HOSPITAL_BASED_OUTPATIENT_CLINIC_OR_DEPARTMENT_OTHER): Payer: Medicare Other | Admitting: Hematology & Oncology

## 2013-03-23 ENCOUNTER — Other Ambulatory Visit (HOSPITAL_BASED_OUTPATIENT_CLINIC_OR_DEPARTMENT_OTHER): Payer: Medicare Other | Admitting: Lab

## 2013-03-23 VITALS — BP 139/62 | HR 68 | Temp 97.9°F | Resp 18 | Ht 65.0 in | Wt 216.0 lb

## 2013-03-23 DIAGNOSIS — D45 Polycythemia vera: Secondary | ICD-10-CM

## 2013-03-23 LAB — CBC WITH DIFFERENTIAL (CANCER CENTER ONLY)
BASO#: 0 10*3/uL (ref 0.0–0.2)
Eosinophils Absolute: 0.2 10*3/uL (ref 0.0–0.5)
HCT: 45.9 % (ref 38.7–49.9)
HGB: 14.8 g/dL (ref 13.0–17.1)
LYMPH%: 21 % (ref 14.0–48.0)
MCV: 87 fL (ref 82–98)
MONO#: 0.6 10*3/uL (ref 0.1–0.9)
NEUT%: 65.8 % (ref 40.0–80.0)
Platelets: 106 10*3/uL — ABNORMAL LOW (ref 145–400)
RBC: 5.25 10*6/uL (ref 4.20–5.70)
WBC: 5.8 10*3/uL (ref 4.0–10.0)

## 2013-03-23 LAB — FERRITIN CHCC: Ferritin: 18 ng/ml — ABNORMAL LOW (ref 22–316)

## 2013-03-23 NOTE — Progress Notes (Signed)
This office note has been dictated.

## 2013-03-23 NOTE — Progress Notes (Signed)
No phlebotomy needed at this time per Dr. Myna Hidalgo.

## 2013-03-24 NOTE — Progress Notes (Signed)
CC:   [name], MD  DIAGNOSIS:  Polycythemia vera.  CURRENT THERAPY:  Phlebotomy to maintain hematocrit less than 45%.  INTERIM HISTORY:  [name] comes in for his followup.  He is doing okay.  He and his wife are still getting by.  The family is taking more of a role in taking care of them.  They are __________ years.  He has done pretty well.  So far, he has had no complications from the polycythemia.  He has had no change in his medications.  He is on quite a few medications.  He has had no cardiac issues.  He has had some hip problems.  He has had little bit of a cough.  This is not productive.  He has had no nausea or vomiting.  He has had no rashes.  Overall, his performance status is ECOG 3.  PHYSICAL EXAMINATION:  General:  This is an elderly white gentleman, in no obvious distress.  Vital Signs:  Temperature of 97.9, pulse 68, respiratory rate 18, blood pressure 139/62.  Weight is 216 pounds.  Head and Neck:  Normocephalic, atraumatic skull.  There are no ocular or oral lesions.  There is no palpable, cervical, or supraclavicular lymph nodes.  Lungs:  Clear bilaterally.  He has no rales, wheezes, or rhonchi.  Cardiac:  Regular rate and rhythm with an occasional extra beat.  He has a 1/6 systolic ejection murmur.  Abdomen:  Soft.  He is somewhat obese.  He has no fluid wave.  There is no palpable hepatosplenomegaly.  Extremities:  Some trace nonpitting edema in his legs.  He has osteoarthritic changes in his joints.  He has decreased range of motion of  lot of his joints secondary to arthritis.  Muscle strength is 4/5 bilaterally.  Skin:  No rash, ecchymosis, or petechia. Neurological:  No focal neurological deficit.  LABORATORY STUDIES:  White cell count 5.8, hemoglobin 14.8, hematocrit 45.9, platelet count 106.  IMPRESSION:  Donald Kaiser is an 79 year old gentleman with history of polycythemia vera.  I really do not think we have to phlebotomize him  today.  I think, we will probably get him through the holidays.  He has a hard time with phlebotomizing as he gets vasovagal.  I will plan to get him back in 2 more months.  I am sure that by then, he probable will need to be phlebotomized.  I am glad to see he and his wife, are still doing okay together.    ______________________________ Josph Macho, M.D. PRE/MEDQ  D:  03/23/2013  T:  03/24/2013  Job:  [id number]

## 2013-04-06 ENCOUNTER — Ambulatory Visit (INDEPENDENT_AMBULATORY_CARE_PROVIDER_SITE_OTHER): Payer: Medicare Other | Admitting: Critical Care Medicine

## 2013-04-06 ENCOUNTER — Encounter: Payer: Self-pay | Admitting: Critical Care Medicine

## 2013-04-06 VITALS — BP 128/72 | HR 67 | Temp 98.0°F | Ht 65.5 in | Wt 226.0 lb

## 2013-04-06 DIAGNOSIS — J45909 Unspecified asthma, uncomplicated: Secondary | ICD-10-CM

## 2013-04-06 MED ORDER — METHYLPREDNISOLONE ACETATE 80 MG/ML IJ SUSP
120.0000 mg | Freq: Once | INTRAMUSCULAR | Status: AC
  Administered 2013-04-06: 120 mg via INTRAMUSCULAR

## 2013-04-06 MED ORDER — MOMETASONE FUROATE 50 MCG/ACT NA SUSP: 2.0000 | Freq: Every day | NASAL | Status: AC

## 2013-04-06 MED ORDER — LEVOFLOXACIN 500 MG PO TABS: 500.0000 mg | ORAL_TABLET | Freq: Every day | ORAL | Status: DC

## 2013-04-06 NOTE — Progress Notes (Signed)
Subjective:    Patient ID: Donald Kaiser, male    DOB: 10-01-1926, 79 y.o.   MRN: [id number]  HPI  79 y.o.WM Asthma hx.  PUL ASTHMA HISTORY 04/06/2013 12/02/2012 05/28/2012 11/25/2011 12/04/2010  Symptoms Daily 0-2 days/week 0-2 days/week >2 days/week 0-2 days/week  Nighttime awakenings >1/wk but not nightly 0-2/month 0-2/month 0-2/month 0-2/month  Interference with activity Minor limitations No limitations Minor limitations Minor limitations Some limitations  SABA use Several times/day 0-2 days/wk 0-2 days/wk 0-2 days/wk 0-2 days/wk  Exacerbations requiring oral steroids 0-1 / year 0-1 / year 0-1 / year 0-1 / year 0-1 / year    04/06/2013 Chief Complaint  Patient presents with  . Follow-up    Pt c/o chest congestion, wheezing, nonprod cough X2wks.   C/o congestion for two weeks, notes more cough. No high fever, no aches or pains.  No real sinus drainage, did have some of this before.  No real chest pain.  Notes some wheezes.      Past Medical History  Diagnosis Date  . OSA (obstructive sleep apnea)   . GERD (gastroesophageal reflux disease)   . Diabetes mellitus type II   . Asthma   . DVT (deep venous thrombosis)     chronic  . Post-phlebitic syndrome   . Polycythemia vera(238.4)     Ennever  . COPD (chronic obstructive pulmonary disease)   . Hypertension      Family History  Problem Relation Age of Onset  . Emphysema Sister   . Allergies Sister   . Asthma Sister   . Heart disease Brother   . Heart disease Brother   . Rheum arthritis Sister   . Rheum arthritis Mother   . Cancer Mother   . Cancer Sister   . Kidney failure Father   . Stroke Sister      History   Social History  . Marital Status: Married    Spouse Name: N/A    Number of Children: N/A  . Years of Education: N/A   Occupational History  . Retired     Optician, dispensing   Social History Main Topics  . Smoking status: Never Smoker   . Smokeless tobacco: Never Used  . Alcohol Use: No  . Drug Use:  No  . Sexual Activity: Not on file   Other Topics Concern  . Not on file   Social History Narrative  . No narrative on file     No Known Allergies   Outpatient Prescriptions Prior to Visit  Medication Sig Dispense Refill  . albuterol-ipratropium (COMBIVENT) 18-103 MCG/ACT inhaler Inhale 1-2 puffs into the lungs every 4 (four) hours as needed.        [name] aspirin 81 MG tablet Take 81 mg by mouth daily.        . benzonatate (TESSALON) 100 MG capsule Take 100 mg by mouth as needed for cough.      . desonide (DESOWEN) 0.05 % cream Apply topically as needed.       . Ergocalciferol (VITAMIN D2) 2000 UNITS TABS Take 1 tablet by mouth daily.        [name] exenatide (BYETTA) 10 MCG/0.04ML SOLN Inject into the skin every morning.       . fluocinonide (LIDEX) 0.05 % external solution Apply topically as needed.       . furosemide (LASIX) 40 MG tablet Take 20 mg by mouth as needed.       . insulin NPH-insulin regular (NOVOLIN 70/30) (70-30) 100 UNIT/ML injection Inject into  the skin daily with breakfast. 25 units in the morning And 5 units in evening      . metoprolol (LOPRESSOR) 50 MG tablet Take 50 mg by mouth daily.       . mometasone (ASMANEX 120 METERED DOSES) 220 MCG/INH inhaler Inhale 2 puffs into the lungs daily.       . montelukast (SINGULAIR) 10 MG tablet Take 10 mg by mouth daily.        . Multiple Vitamins-Minerals (PRESERVISION AREDS PO) Take 1 capsule by mouth 2 (two) times daily.        . nitroGLYCERIN (NITROSTAT) 0.4 MG SL tablet Place 0.4 mg under the tongue every 5 (five) minutes as needed.        . simvastatin (ZOCOR) 40 MG tablet Take 40 mg by mouth at bedtime.        . traMADol (ULTRAM) 50 MG tablet Take 50 mg by mouth every 6 (six) hours as needed.      . travoprost, benzalkonium, (TRAVATAN) 0.004 % ophthalmic solution Place 1 drop into both eyes daily.       . mometasone (NASONEX) 50 MCG/ACT nasal spray Place 2 sprays into the nose daily as needed.        No facility-administered  medications prior to visit.     Review of Systems  Constitutional:   No  weight loss, night sweats,  Fevers, chills, + fatigue, lassitude. HEENT:   No headaches,  Difficulty swallowing,  Tooth/dental problems,  Sore throat,                No sneezing, itching, ear ache, nasal congestion, post nasal drip,   CV:  No chest pain,  Orthopnea, PND, swelling in lower extremities, anasarca, dizziness, palpitations  GI  No heartburn, indigestion, abdominal pain, nausea, vomiting, diarrhea, change in bowel habits, loss of appetite  Resp: Notes  shortness of breath with exertion not  at rest.  No excess mucus, no productive cough,     No coughing up of blood.  No change in color of mucus.  No wheezing.  No chest wall deformity  Skin: no rash or lesions.  GU: no dysuria, change in color of urine, no urgency or frequency.  No flank pain.  MS:  No joint pain or swelling.  No decreased range of motion.  No back pain.  Psych:  No change in mood or affect. No depression or anxiety.  No memory loss. w    Objective:   Physical Exam   Filed Vitals:   04/06/13 1514  BP: 128/72  Pulse: 67  Temp: 98 F (36.7 C)  TempSrc: Oral  Height: 5' 5.5" (1.664 m)  Weight: 226 lb (102.513 kg)  SpO2: 98%    Gen: Pleasant, well-nourished, in no distress,  normal affect  ENT: No lesions,  mouth clear,  oropharynx clear,no postnasal drip  Neck: No JVD, no TMG, no carotid bruits  Lungs: No use of accessory muscles, no dullness to percussion,  expired wheezes  Cardiovascular: RRR, heart sounds normal, no murmur or gallops, no peripheral edema  Abdomen: soft and NT, no HSM,  BS normal  Musculoskeletal: No deformities, no cyanosis or clubbing  Neuro: alert, non focal  Skin: Warm, no lesions or rashes     Assessment & Plan:    Moderate persistent asthma Moderate persistent asthma with associated acute tracheobronchitis Plan Levaquin 500mg  daily for 5 days A Depomedrol 120mg  injection was  given Use nasonex daily two puff ea nostril No change in  inhaled medications Return 4 months or sooner if unimproving     Updated Medication List Outpatient Encounter Prescriptions as of 04/06/2013  Medication Sig  . albuterol-ipratropium (COMBIVENT) 18-103 MCG/ACT inhaler Inhale 1-2 puffs into the lungs every 4 (four) hours as needed.    [name] aspirin 81 MG tablet Take 81 mg by mouth daily.    . benzonatate (TESSALON) 100 MG capsule Take 100 mg by mouth as needed for cough.  . desonide (DESOWEN) 0.05 % cream Apply topically as needed.   . Ergocalciferol (VITAMIN D2) 2000 UNITS TABS Take 1 tablet by mouth daily.    [name] exenatide (BYETTA) 10 MCG/0.04ML SOLN Inject into the skin every morning.   . fluocinonide (LIDEX) 0.05 % external solution Apply topically as needed.   . furosemide (LASIX) 40 MG tablet Take 20 mg by mouth as needed.   . insulin NPH-insulin regular (NOVOLIN 70/30) (70-30) 100 UNIT/ML injection Inject into the skin daily with breakfast. 25 units in the morning And 5 units in evening  . metoprolol (LOPRESSOR) 50 MG tablet Take 50 mg by mouth daily.   . mometasone (ASMANEX 120 METERED DOSES) 220 MCG/INH inhaler Inhale 2 puffs into the lungs daily.   . mometasone (NASONEX) 50 MCG/ACT nasal spray Place 2 sprays into the nose daily.  . montelukast (SINGULAIR) 10 MG tablet Take 10 mg by mouth daily.    . Multiple Vitamins-Minerals (PRESERVISION AREDS PO) Take 1 capsule by mouth 2 (two) times daily.    . nitroGLYCERIN (NITROSTAT) 0.4 MG SL tablet Place 0.4 mg under the tongue every 5 (five) minutes as needed.    . simvastatin (ZOCOR) 40 MG tablet Take 40 mg by mouth at bedtime.    . traMADol (ULTRAM) 50 MG tablet Take 50 mg by mouth every 6 (six) hours as needed.  . travoprost, benzalkonium, (TRAVATAN) 0.004 % ophthalmic solution Place 1 drop into both eyes daily.   . [DISCONTINUED] mometasone (NASONEX) 50 MCG/ACT nasal spray Place 2 sprays into the nose daily as needed.   [name]  levofloxacin (LEVAQUIN) 500 MG tablet Take 1 tablet (500 mg total) by mouth daily.  . [EXPIRED] methylPREDNISolone acetate (DEPO-MEDROL) injection 120 mg           Updated Medication List Outpatient Encounter Prescriptions as of 04/06/2013  Medication Sig  . albuterol-ipratropium (COMBIVENT) 18-103 MCG/ACT inhaler Inhale 1-2 puffs into the lungs every 4 (four) hours as needed.    [name] aspirin 81 MG tablet Take 81 mg by mouth daily.    . benzonatate (TESSALON) 100 MG capsule Take 100 mg by mouth as needed for cough.  . desonide (DESOWEN) 0.05 % cream Apply topically as needed.   . Ergocalciferol (VITAMIN D2) 2000 UNITS TABS Take 1 tablet by mouth daily.    [name] exenatide (BYETTA) 10 MCG/0.04ML SOLN Inject into the skin every morning.   . fluocinonide (LIDEX) 0.05 % external solution Apply topically as needed.   . furosemide (LASIX) 40 MG tablet Take 20 mg by mouth as needed.   . insulin NPH-insulin regular (NOVOLIN 70/30) (70-30) 100 UNIT/ML injection Inject into the skin daily with breakfast. 25 units in the morning And 5 units in evening  . metoprolol (LOPRESSOR) 50 MG tablet Take 50 mg by mouth daily.   . mometasone (ASMANEX 120 METERED DOSES) 220 MCG/INH inhaler Inhale 2 puffs into the lungs daily.   . mometasone (NASONEX) 50 MCG/ACT nasal spray Place 2 sprays into the nose daily.  . montelukast (SINGULAIR) 10 MG tablet Take 10 mg  by mouth daily.    . Multiple Vitamins-Minerals (PRESERVISION AREDS PO) Take 1 capsule by mouth 2 (two) times daily.    . nitroGLYCERIN (NITROSTAT) 0.4 MG SL tablet Place 0.4 mg under the tongue every 5 (five) minutes as needed.    . simvastatin (ZOCOR) 40 MG tablet Take 40 mg by mouth at bedtime.    . traMADol (ULTRAM) 50 MG tablet Take 50 mg by mouth every 6 (six) hours as needed.  . travoprost, benzalkonium, (TRAVATAN) 0.004 % ophthalmic solution Place 1 drop into both eyes daily.   . [DISCONTINUED] mometasone (NASONEX) 50 MCG/ACT nasal spray Place 2 sprays  into the nose daily as needed.   [name] levofloxacin (LEVAQUIN) 500 MG tablet Take 1 tablet (500 mg total) by mouth daily.  . [EXPIRED] methylPREDNISolone acetate (DEPO-MEDROL) injection 120 mg

## 2013-04-06 NOTE — Assessment & Plan Note (Signed)
Moderate persistent asthma with associated acute tracheobronchitis Plan Levaquin 500mg  daily for 5 days A Depomedrol 120mg  injection was given Use nasonex daily two puff ea nostril No change in inhaled medications Return 4 months or sooner if unimproving

## 2013-04-06 NOTE — Patient Instructions (Signed)
Levaquin 500mg  daily for 5 days A Depomedrol 120mg  injection was given Use nasonex daily two puff ea nostril No change in inhaled medications Return 4 months or sooner if unimproving

## 2013-05-18 ENCOUNTER — Other Ambulatory Visit (HOSPITAL_BASED_OUTPATIENT_CLINIC_OR_DEPARTMENT_OTHER): Payer: Medicare Other | Admitting: Lab

## 2013-05-18 ENCOUNTER — Ambulatory Visit (HOSPITAL_BASED_OUTPATIENT_CLINIC_OR_DEPARTMENT_OTHER): Payer: Medicare Other | Admitting: Hematology & Oncology

## 2013-05-18 ENCOUNTER — Encounter: Payer: Self-pay | Admitting: Hematology & Oncology

## 2013-05-18 ENCOUNTER — Ambulatory Visit (HOSPITAL_BASED_OUTPATIENT_CLINIC_OR_DEPARTMENT_OTHER): Payer: Medicare Other

## 2013-05-18 VITALS — BP 148/73 | HR 66 | Temp 97.6°F | Resp 18 | Ht 65.0 in | Wt 217.0 lb

## 2013-05-18 VITALS — BP 108/65 | HR 67 | Resp 18

## 2013-05-18 DIAGNOSIS — D45 Polycythemia vera: Secondary | ICD-10-CM

## 2013-05-18 DIAGNOSIS — D696 Thrombocytopenia, unspecified: Secondary | ICD-10-CM

## 2013-05-18 LAB — CBC WITH DIFFERENTIAL (CANCER CENTER ONLY)
BASO#: 0 10*3/uL (ref 0.0–0.2)
BASO%: 0.3 % (ref 0.0–2.0)
EOS%: 1.5 % (ref 0.0–7.0)
Eosinophils Absolute: 0.1 10*3/uL (ref 0.0–0.5)
HCT: 48.5 % (ref 38.7–49.9)
HEMOGLOBIN: 15.7 g/dL (ref 13.0–17.1)
LYMPH#: 1 10*3/uL (ref 0.9–3.3)
LYMPH%: 14.9 % (ref 14.0–48.0)
MCH: 28.2 pg (ref 28.0–33.4)
MCHC: 32.4 g/dL (ref 32.0–35.9)
MCV: 87 fL (ref 82–98)
MONO#: 0.6 10*3/uL (ref 0.1–0.9)
MONO%: 8.4 % (ref 0.0–13.0)
NEUT#: 5.1 10*3/uL (ref 1.5–6.5)
NEUT%: 74.9 % (ref 40.0–80.0)
Platelets: 100 10*3/uL — ABNORMAL LOW (ref 145–400)
RBC: 5.56 10*6/uL (ref 4.20–5.70)
RDW: 15.2 % (ref 11.1–15.7)
WBC: 6.8 10*3/uL (ref 4.0–10.0)

## 2013-05-18 MED ORDER — HEPARIN SOD (PORK) LOCK FLUSH 100 UNIT/ML IV SOLN
500.0000 [IU] | INTRAVENOUS | Status: DC | PRN
  Filled 2013-05-18: qty 5

## 2013-05-18 MED ORDER — SODIUM CHLORIDE 0.9 % IJ SOLN
10.0000 mL | INTRAMUSCULAR | Status: DC | PRN
  Filled 2013-05-18: qty 10

## 2013-05-18 MED ORDER — ANTICOAGULANT SODIUM CITRATE 4% (200MG/5ML) IV SOLN: 5.0000 mL | Status: DC | PRN

## 2013-05-18 MED ORDER — HEPARIN SOD (PORK) LOCK FLUSH 100 UNIT/ML IV SOLN
250.0000 [IU] | INTRAVENOUS | Status: DC | PRN
  Filled 2013-05-18: qty 5

## 2013-05-18 MED ORDER — ALTEPLASE 2 MG IJ SOLR
2.0000 mg | Freq: Once | INTRAMUSCULAR | Status: DC | PRN
Start: 2013-05-18 — End: 2013-05-18
  Filled 2013-05-18: qty 2

## 2013-05-18 NOTE — Progress Notes (Signed)
Hematology and Oncology Follow Up Visit  Donald Kaiser [id number] [birth year] 80 y.o. 05/18/2013   Principle Diagnosis:   Polycythemia vera  Current Therapy:    Phlebotomy to maintain hematocrit less than 45%  Aspirin 81 mg by mouth daily     Interim History: Donald Kaiser is in for followup. Last saw him back in December. He is doing okay. He's had no cardiac issues. He's had no problems with the flu. He has his back issues but seems to be doing okay with this. He's had no pallor bladder problems. He is diabetic. He is on insulin.  He's had no cough. There's been no shortness of breath. He's had no nausea vomiting. He has not noted any leg swelling or rashes.  He is on quite a few medications.  Overall, his performance status is ECOG 2.  Medications: Current outpatient prescriptions:albuterol-ipratropium (COMBIVENT) 18-103 MCG/ACT inhaler, Inhale 1-2 puffs into the lungs every 4 (four) hours as needed.  , Disp: , Rfl: ;  aspirin 81 MG tablet, Take 81 mg by mouth daily.  , Disp: , Rfl: ;  benzonatate (TESSALON) 100 MG capsule, Take 100 mg by mouth as needed for cough., Disp: , Rfl: ;  desonide (DESOWEN) 0.05 % cream, Apply topically as needed. , Disp: , Rfl:  Ergocalciferol (VITAMIN D2) 2000 UNITS TABS, Take 1 tablet by mouth daily.  , Disp: , Rfl: ;  exenatide (BYETTA) 10 MCG/0.04ML SOLN, Inject into the skin every morning. , Disp: , Rfl: ;  fluocinonide (LIDEX) 0.05 % external solution, Apply topically as needed. , Disp: , Rfl: ;  furosemide (LASIX) 40 MG tablet, Take 20 mg by mouth as needed. , Disp: , Rfl:  insulin NPH-insulin regular (NOVOLIN 70/30) (70-30) 100 UNIT/ML injection, Inject into the skin daily with breakfast. 23 units in the morning And 5 units in evening, Disp: , Rfl: ;  levofloxacin (LEVAQUIN) 500 MG tablet, Take 1 tablet (500 mg total) by mouth daily., Disp: 5 tablet, Rfl: 0;  metoprolol (LOPRESSOR) 50 MG tablet, Take 50 mg by mouth daily. , Disp: , Rfl:  mometasone  (ASMANEX 120 METERED DOSES) 220 MCG/INH inhaler, Inhale 2 puffs into the lungs daily. , Disp: , Rfl: ;  mometasone (NASONEX) 50 MCG/ACT nasal spray, Place 2 sprays into the nose daily., Disp: 17 g, Rfl: ;  montelukast (SINGULAIR) 10 MG tablet, Take 10 mg by mouth daily.  , Disp: , Rfl: ;  Multiple Vitamins-Minerals (PRESERVISION AREDS PO), Take 1 capsule by mouth 2 (two) times daily.  , Disp: , Rfl:  nitroGLYCERIN (NITROSTAT) 0.4 MG SL tablet, Place 0.4 mg under the tongue every 5 (five) minutes as needed.  , Disp: , Rfl: ;  simvastatin (ZOCOR) 40 MG tablet, Take 40 mg by mouth at bedtime.  , Disp: , Rfl: ;  traMADol (ULTRAM) 50 MG tablet, Take 50 mg by mouth every 6 (six) hours as needed., Disp: , Rfl: ;  travoprost, benzalkonium, (TRAVATAN) 0.004 % ophthalmic solution, Place 1 drop into both eyes daily. , Disp: , Rfl:  No current facility-administered medications for this visit. Facility-Administered Medications Ordered in Other Visits: alteplase (CATHFLO ACTIVASE) injection 2 mg, 2 mg, Intracatheter, Once PRN, Volanda Napoleon, MD;  heparin lock flush 100 unit/mL, 250 Units, Intracatheter, PRN, Volanda Napoleon, MD;  heparin lock flush 100 unit/mL, 500 Units, Intracatheter, PRN, Volanda Napoleon, MD;  sodium chloride 0.9 % injection 10 mL, 10 mL, Intracatheter, PRN, Volanda Napoleon, MD sodium chloride 0.9 % injection 10 mL,  10 mL, Intracatheter, PRN, Volanda Napoleon, MD;  sodium chloride 0.9 % injection 10 mL, 10 mL, Intracatheter, PRN, Volanda Napoleon, MD;  sodium chloride 0.9 % injection 10 mL, 10 mL, Intracatheter, PRN, Volanda Napoleon, MD  Allergies: No Known Allergies  Past Medical History, Surgical history, Social history, and Family History were reviewed and updated.  Review of Systems: As above  Physical Exam:  height is 5\' 5"  (1.651 m) and weight is 217 lb (98.431 kg). His oral temperature is 97.6 F (36.4 C). His blood pressure is 148/73 and his pulse is 66. His respiration is 18.    Somewhat obese white gentleman. He is somewhat frail. He has no ocular or oral lesions. There is no adenopathy in his neck. Lungs are clear. Cardiac exam regular rate and rhythm. He has an occasional extra beat. There is no murmurs. Abdomen is soft, slightly distended. He has good bowel sounds. There is no palpable hepato- splenomegaly extremities shows trace edema. He has osteoarthritic changes. Strength is 4/5 bilaterally. Skin exam no rashes. Neurological exam shows no focal neurological deficits.  Lab Results  Component Value Date   WBC 6.8 05/18/2013   HGB 15.7 05/18/2013   HCT 48.5 05/18/2013   MCV 87 05/18/2013   PLT 100* 05/18/2013     Chemistry      Component Value Date/Time   NA 137 11/15/2010 1310   NA 139 07/12/2010 0935   K 4.1 11/15/2010 1310   K 4.0 07/12/2010 0935   CL 102 11/15/2010 1310   CL 101 07/12/2010 0935   CO2 28 11/15/2010 1310   CO2 27 07/12/2010 0935   BUN 19 11/15/2010 1310   BUN 26* 07/12/2010 0935   CREATININE 1.4* 11/15/2010 1310   CREATININE 1.29 07/12/2010 0935      Component Value Date/Time   CALCIUM 8.5 11/15/2010 1310   CALCIUM 9.7 07/12/2010 0935   ALKPHOS 69 07/10/2009 0942   AST 25 07/10/2009 0942   ALT 17 07/10/2009 0942   BILITOT 0.7 07/10/2009 0942         Impression and Plan: Donald Kaiser is is an 80 year old gentleman. He has polycythemia. We will phlebotomize him today with his hematocrit over 45%. I think phlebotomizing him will help him. He does get vasovagal with his phlebotomies. We give him some fluids.  He has chronic thrombocytopenia. This is stable.  We'll plan to get back to see [place] in another couple months.   Volanda Napoleon, MD 2/10/20151:46 PM

## 2013-05-18 NOTE — Progress Notes (Signed)
Donald Kaiser presents today for phlebotomy per MD orders. Phlebotomy procedure started at 1145 and ended at 1200. 500 mls removed. Patient observed for 30 minutes after procedure without any incident. Patient tolerated procedure well. IV needle removed intact.

## 2013-05-18 NOTE — Patient Instructions (Signed)
Therapeutic Phlebotomy Therapeutic phlebotomy is the controlled removal of blood from your body for the purpose of treating a medical condition. It is similar to donating blood. Usually, about a pint (470 mL) of blood is removed. The average adult has 9 to 12 pints (4.3 to 5.7 L) of blood. Therapeutic phlebotomy may be used to treat the following medical conditions:  Hemochromatosis. This is a condition in which there is too much iron in the blood.  Polycythemia vera. This is a condition in which there are too many red cells in the blood.  Porphyria cutanea tarda. This is a disease usually passed from one generation to the next (inherited). It is a condition in which an important part of hemoglobin is not made properly. This results in the build up of abnormal amounts of porphyrins in the body.  Sickle cell disease. This is an inherited disease. It is a condition in which the red blood cells form an abnormal crescent shape rather than a round shape. LET YOUR CAREGIVER KNOW ABOUT:  Allergies.  Medicines taken including herbs, eyedrops, over-the-counter medicines, and creams.  Use of steroids (by mouth or creams).  Previous problems with anesthetics or numbing medicine.  History of blood clots.  History of bleeding or blood problems.  Previous surgery.  Possibility of pregnancy, if this applies. RISKS AND COMPLICATIONS This is a simple and safe procedure. Problems are unlikely. However, problems can occur and may include:  Nausea or lightheadedness.  Low blood pressure.  Soreness, bleeding, swelling, or bruising at the needle insertion site.  Infection. BEFORE THE PROCEDURE  This is a procedure that can be done as an outpatient. Confirm the time that you need to arrive for your procedure. Confirm whether there is a need to fast or withhold any medications. It is helpful to wear clothing with sleeves that can be raised above the elbow. A blood sample may be done to determine the  amount of red blood cells or iron in your blood. Plan ahead of time to have someone drive you home after the procedure. PROCEDURE The entire procedure from preparation through recovery takes about 1 hour. The actual collection takes about 10 to 15 minutes.  A needle will be inserted into your vein.  Tubing and a collection bag will be attached to that needle.  Blood will flow through the needle and tubing into the collection bag.  You may be asked to open and close your hand slowly and continuously during the entire collection.  Once the specified amount of blood has been removed from your body, the collection bag and tubing will be clamped.  The needle will be removed.  Pressure will be held on the site of the needle insertion to stop the bleeding. Then a bandage will be placed over the needle insertion site. AFTER THE PROCEDURE  Your recovery will be assessed and monitored. If there are no problems, as an outpatient, you should be able to go home shortly after the procedure.  Document Released: 08/27/2010 Document Revised: 06/17/2011 Document Reviewed: 08/27/2010 [employer] Patient Information 2014 [employer].  

## 2013-05-19 ENCOUNTER — Telehealth: Payer: Self-pay | Admitting: Hematology & Oncology

## 2013-05-19 LAB — FERRITIN CHCC: Ferritin: 24 ng/ml (ref 22–316)

## 2013-05-19 NOTE — Telephone Encounter (Signed)
Mailed April schedule

## 2013-06-29 LAB — PULMONARY FUNCTION TEST
FEF 25-75 Post: 2.74 L/sec
FEF 25-75 Pre: 2.08 L/sec
FEF2575-%Change-Post: 32 %
FEF2575-%Pred-Post: 200 %
FEF2575-%Pred-Pre: 151 %
FEV1-%Change-Post: 8 %
FEV1-%Pred-Post: 116 %
FEV1-%Pred-Pre: 106 %
FEV1-Post: 2.5 L
FEV1-Pre: 2.3 L
FEV1FVC-%Change-Post: 5 %
FEV1FVC-%Pred-Pre: 112 %
FEV6-%Change-Post: 2 %
FEV6-%Pred-Post: 103 %
FEV6-%Pred-Pre: 100 %
FEV6-Post: 2.96 L
FEV6-Pre: 2.88 L
FEV6FVC-%Pred-Post: 108 %
FEV6FVC-%Pred-Pre: 108 %
FVC-%Change-Post: 2 %
FVC-%Pred-Post: 94 %
FVC-%Pred-Pre: 92 %
FVC-Post: 2.96 L
Post FEV1/FVC ratio: 84 %
Post FEV6/FVC ratio: 100 %
Pre FEV1/FVC ratio: 80 %
Pre FEV6/FVC Ratio: 100 %

## 2013-07-14 ENCOUNTER — Encounter: Payer: Self-pay | Admitting: Hematology & Oncology

## 2013-07-14 ENCOUNTER — Other Ambulatory Visit: Payer: Self-pay | Admitting: Nurse Practitioner

## 2013-07-14 ENCOUNTER — Ambulatory Visit (HOSPITAL_BASED_OUTPATIENT_CLINIC_OR_DEPARTMENT_OTHER): Payer: Medicare Other | Admitting: Hematology & Oncology

## 2013-07-14 ENCOUNTER — Other Ambulatory Visit (HOSPITAL_BASED_OUTPATIENT_CLINIC_OR_DEPARTMENT_OTHER): Payer: Medicare Other | Admitting: Lab

## 2013-07-14 VITALS — BP 152/72 | HR 72 | Temp 97.8°F | Resp 18 | Ht 66.0 in | Wt 216.0 lb

## 2013-07-14 DIAGNOSIS — D45 Polycythemia vera: Secondary | ICD-10-CM

## 2013-07-14 LAB — CBC WITH DIFFERENTIAL (CANCER CENTER ONLY)
BASO#: 0 10*3/uL (ref 0.0–0.2)
BASO%: 0.3 % (ref 0.0–2.0)
EOS ABS: 0.1 10*3/uL (ref 0.0–0.5)
EOS%: 1.9 % (ref 0.0–7.0)
HEMATOCRIT: 46 % (ref 38.7–49.9)
HGB: 15.4 g/dL (ref 13.0–17.1)
LYMPH#: 1.2 10*3/uL (ref 0.9–3.3)
LYMPH%: 21 % (ref 14.0–48.0)
MCH: 29.3 pg (ref 28.0–33.4)
MCHC: 33.5 g/dL (ref 32.0–35.9)
MCV: 88 fL (ref 82–98)
MONO#: 0.6 10*3/uL (ref 0.1–0.9)
MONO%: 9.5 % (ref 0.0–13.0)
NEUT#: 3.9 10*3/uL (ref 1.5–6.5)
NEUT%: 67.3 % (ref 40.0–80.0)
Platelets: 106 10*3/uL — ABNORMAL LOW (ref 145–400)
RBC: 5.26 10*6/uL (ref 4.20–5.70)
RDW: 14.7 % (ref 11.1–15.7)
WBC: 5.8 10*3/uL (ref 4.0–10.0)

## 2013-07-14 NOTE — Progress Notes (Signed)
Hematology and Oncology Follow Up Visit  Donald Kaiser [id number] November 07, 1926 80 y.o. 07/14/2013   Principle Diagnosis:   Polycythemia vera  Current Therapy:    Phlebotomy to maintain hematocrit low 45%  Aspirin 81 mg by mouth daily     Interim History:  Mr.  Donald Kaiser is back for followup. Last him back in February. His phlebotomized at that point on. He's doing okay. He has had no new problems to deal with. He is insulin-dependent diabetic. His blood sugars have been doing all right.  He's had no problems with nausea vomiting. He's had chronic back issues. His energy to bowel bladder habits. He's had some occasional leg swelling.  Overall, his performance status is ECOG 3.  Medications: Current outpatient prescriptions:albuterol-ipratropium (COMBIVENT) 18-103 MCG/ACT inhaler, Inhale 1-2 puffs into the lungs every 4 (four) hours as needed.  , Disp: , Rfl: ;  aspirin 81 MG tablet, Take 81 mg by mouth daily.  , Disp: , Rfl: ;  desonide (DESOWEN) 0.05 % cream, Apply topically as needed. , Disp: , Rfl: ;  Ergocalciferol (VITAMIN D2) 2000 UNITS TABS, Take 1 tablet by mouth daily.  , Disp: , Rfl:  exenatide (BYETTA) 10 MCG/0.04ML SOLN, Inject into the skin every morning. , Disp: , Rfl: ;  fluocinonide (LIDEX) 0.05 % external solution, Apply topically as needed. , Disp: , Rfl: ;  furosemide (LASIX) 40 MG tablet, Take 20 mg by mouth as needed. , Disp: , Rfl: ;  insulin NPH-insulin regular (NOVOLIN 70/30) (70-30) 100 UNIT/ML injection, Inject into the skin daily with breakfast. 23 units in the morning And 5 units in evening, Disp: , Rfl:  levofloxacin (LEVAQUIN) 500 MG tablet, Take 1 tablet (500 mg total) by mouth daily., Disp: 5 tablet, Rfl: 0;  metoprolol (LOPRESSOR) 50 MG tablet, Take 50 mg by mouth daily. , Disp: , Rfl: ;  mometasone (ASMANEX 120 METERED DOSES) 220 MCG/INH inhaler, Inhale 2 puffs into the lungs daily. , Disp: , Rfl: ;  mometasone (NASONEX) 50 MCG/ACT nasal spray, Place 2 sprays  into the nose daily., Disp: 17 g, Rfl:  montelukast (SINGULAIR) 10 MG tablet, Take 10 mg by mouth daily.  , Disp: , Rfl: ;  Multiple Vitamins-Minerals (PRESERVISION AREDS PO), Take 1 capsule by mouth 2 (two) times daily.  , Disp: , Rfl: ;  nitroGLYCERIN (NITROSTAT) 0.4 MG SL tablet, Place 0.4 mg under the tongue every 5 (five) minutes as needed.  , Disp: , Rfl: ;  simvastatin (ZOCOR) 40 MG tablet, Take 40 mg by mouth at bedtime.  , Disp: , Rfl:  traMADol (ULTRAM) 50 MG tablet, Take 50 mg by mouth every 6 (six) hours as needed., Disp: , Rfl: ;  travoprost, benzalkonium, (TRAVATAN) 0.004 % ophthalmic solution, Place 1 drop into both eyes daily. , Disp: , Rfl:   Allergies: No Known Allergies  Past Medical History, Surgical history, Social history, and Family History were reviewed and updated.  Review of Systems: As above  Physical Exam:  height is 5\' 6"  (1.676 m) and weight is 216 lb (97.977 kg). His oral temperature is 97.8 F (36.6 C). His blood pressure is 152/72 and his pulse is 72. His respiration is 18.   Obese white gentleman. Lungs are clear. Cardiac exam regular in rhythm. Abdomen obese but soft. Has good bowel sounds. There is no fluid wave. There is a palpable liver or spleen tip extremities shows some 1+ edema in his lower legs. He has some swelling and erythema in his fingers. He  has some stasis dermatitis changes in his legs. Neurological exam no focal deficits.  Lab Results  Component Value Date   WBC 5.8 07/14/2013   HGB 15.4 07/14/2013   HCT 46.0 07/14/2013   MCV 88 07/14/2013   PLT 106* 07/14/2013     Chemistry      Component Value Date/Time   NA 137 11/15/2010 1310   NA 139 07/12/2010 0935   K 4.1 11/15/2010 1310   K 4.0 07/12/2010 0935   CL 102 11/15/2010 1310   CL 101 07/12/2010 0935   CO2 28 11/15/2010 1310   CO2 27 07/12/2010 0935   BUN 19 11/15/2010 1310   BUN 26* 07/12/2010 0935   CREATININE 1.4* 11/15/2010 1310   CREATININE 1.29 07/12/2010 0935      Component Value Date/Time   CALCIUM  8.5 11/15/2010 1310   CALCIUM 9.7 07/12/2010 0935   ALKPHOS 69 07/10/2009 0942   AST 25 07/10/2009 0942   ALT 17 07/10/2009 0942   BILITOT 0.7 07/10/2009 0942         Impression and Plan: Donald Kaiser is an 80 year old gentleman. His polycythemia vera.  Thing we can get away without phlebotomizing him right now.  We will plan him back in one month. At that point on, he will need to be phlebotomized.   Volanda Napoleon, MD 4/8/20151:39 PM

## 2013-07-15 LAB — FERRITIN CHCC: FERRITIN: 22 ng/mL (ref 22–316)

## 2013-08-12 ENCOUNTER — Ambulatory Visit (HOSPITAL_BASED_OUTPATIENT_CLINIC_OR_DEPARTMENT_OTHER): Payer: Medicare Other | Admitting: Hematology & Oncology

## 2013-08-12 ENCOUNTER — Other Ambulatory Visit (HOSPITAL_BASED_OUTPATIENT_CLINIC_OR_DEPARTMENT_OTHER): Payer: Medicare Other | Admitting: Lab

## 2013-08-12 ENCOUNTER — Encounter: Payer: Self-pay | Admitting: Hematology & Oncology

## 2013-08-12 VITALS — BP 147/59 | HR 58 | Temp 97.9°F | Resp 18 | Ht 66.0 in | Wt 215.0 lb

## 2013-08-12 DIAGNOSIS — D45 Polycythemia vera: Secondary | ICD-10-CM

## 2013-08-12 LAB — CBC WITH DIFFERENTIAL (CANCER CENTER ONLY)
BASO#: 0 10*3/uL (ref 0.0–0.2)
BASO%: 0.2 % (ref 0.0–2.0)
EOS ABS: 0.2 10*3/uL (ref 0.0–0.5)
EOS%: 2.6 % (ref 0.0–7.0)
HCT: 46 % (ref 38.7–49.9)
HGB: 15.1 g/dL (ref 13.0–17.1)
LYMPH#: 1.3 10*3/uL (ref 0.9–3.3)
LYMPH%: 21.4 % (ref 14.0–48.0)
MCH: 29.5 pg (ref 28.0–33.4)
MCHC: 32.8 g/dL (ref 32.0–35.9)
MCV: 90 fL (ref 82–98)
MONO#: 0.6 10*3/uL (ref 0.1–0.9)
MONO%: 10.5 % (ref 0.0–13.0)
NEUT#: 4 10*3/uL (ref 1.5–6.5)
NEUT%: 65.3 % (ref 40.0–80.0)
PLATELETS: 104 10*3/uL — AB (ref 145–400)
RBC: 5.12 10*6/uL (ref 4.20–5.70)
RDW: 14.2 % (ref 11.1–15.7)
WBC: 6.1 10*3/uL (ref 4.0–10.0)

## 2013-08-12 NOTE — Progress Notes (Signed)
Hematology and Oncology Follow Up Visit  Donald Kaiser [id number] [birth year] 80 y.o. 08/12/2013   Principle Diagnosis:   Polycythemia vera  Current Therapy:    Phlebotomy to maintain hematocrit low 45%  Aspirin 81 mg by mouth daily     Interim History:  Mr.  Kaiser is back for followup. Last him back in March. He did not require phlebotomy.   His wife, unfortunately, did have a heart attack. This happened a week after we saw him. She is in the hospital for a few days. She is home now. She is doing a lot better.  Donald Kaiser He's doing okay. Donald Kaiser He is insulin-dependent diabetic. His blood sugars have been doing all right.  He's had no problems with nausea vomiting. He's had chronic back issues. There is no change with his bowel or bladder habits. He's had some occasional leg swelling.  Overall, his performance status is ECOG 3.  Medications: Current outpatient prescriptions:albuterol-ipratropium (COMBIVENT) 18-103 MCG/ACT inhaler, Inhale 1-2 puffs into the lungs every 4 (four) hours as needed.  , Disp: , Rfl: ;  aspirin 81 MG tablet, Take 81 mg by mouth daily.  , Disp: , Rfl: ;  desonide (DESOWEN) 0.05 % cream, Apply topically as needed. , Disp: , Rfl: ;  Ergocalciferol (VITAMIN D2) 2000 UNITS TABS, Take 1 tablet by mouth daily.  , Disp: , Rfl:  exenatide (BYETTA) 10 MCG/0.04ML SOLN, Inject into the skin every morning. , Disp: , Rfl: ;  fluocinonide (LIDEX) 0.05 % external solution, Apply topically as needed. , Disp: , Rfl: ;  furosemide (LASIX) 40 MG tablet, Take 20 mg by mouth as needed. , Disp: , Rfl: ;  insulin NPH-insulin regular (NOVOLIN 70/30) (70-30) 100 UNIT/ML injection, Inject into the skin daily with breakfast. 23 units in the morning And 5 units in evening, Disp: , Rfl:  levofloxacin (LEVAQUIN) 500 MG tablet, Take 1 tablet (500 mg total) by mouth daily., Disp: 5 tablet, Rfl: 0;  metoprolol (LOPRESSOR) 50 MG tablet, Take 50 mg by mouth daily. , Disp: , Rfl: ;  mometasone (ASMANEX 120  METERED DOSES) 220 MCG/INH inhaler, Inhale 2 puffs into the lungs daily. , Disp: , Rfl: ;  mometasone (NASONEX) 50 MCG/ACT nasal spray, Place 2 sprays into the nose daily., Disp: 17 g, Rfl:  montelukast (SINGULAIR) 10 MG tablet, Take 10 mg by mouth daily.  , Disp: , Rfl: ;  Multiple Vitamins-Minerals (PRESERVISION AREDS PO), Take 1 capsule by mouth 2 (two) times daily.  , Disp: , Rfl: ;  nitroGLYCERIN (NITROSTAT) 0.4 MG SL tablet, Place 0.4 mg under the tongue every 5 (five) minutes as needed.  , Disp: , Rfl: ;  simvastatin (ZOCOR) 40 MG tablet, Take 40 mg by mouth at bedtime.  , Disp: , Rfl:  traMADol (ULTRAM) 50 MG tablet, Take 50 mg by mouth every 6 (six) hours as needed., Disp: , Rfl: ;  travoprost, benzalkonium, (TRAVATAN) 0.004 % ophthalmic solution, Place 1 drop into both eyes daily. , Disp: , Rfl:   Allergies: No Known Allergies  Past Medical History, Surgical history, Social history, and Family History were reviewed and updated.  Review of Systems: As above  Physical Exam:  height is 5\' 6"  (1.676 m) and weight is 215 lb (97.523 kg). His oral temperature is 97.9 F (36.6 C). His blood pressure is 147/59 and his pulse is 58. His respiration is 18.   Obese white gentleman. Lungs are clear. Cardiac exam regular in rhythm. Abdomen obese but soft. Has good  bowel sounds. There is no fluid wave. There is a palpable liver or spleen tip extremities shows some 1+ edema in his lower legs. He has some swelling and erythema in his fingers. He has some stasis dermatitis changes in his legs. Neurological exam no focal deficits.  Lab Results  Component Value Date   WBC 6.1 08/12/2013   HGB 15.1 08/12/2013   HCT 46.0 08/12/2013   MCV 90 08/12/2013   PLT 104* 08/12/2013     Chemistry      Component Value Date/Time   NA 137 11/15/2010 1310   NA 139 07/12/2010 0935   K 4.1 11/15/2010 1310   K 4.0 07/12/2010 0935   CL 102 11/15/2010 1310   CL 101 07/12/2010 0935   CO2 28 11/15/2010 1310   CO2 27 07/12/2010 0935   BUN  19 11/15/2010 1310   BUN 26* 07/12/2010 0935   CREATININE 1.4* 11/15/2010 1310   CREATININE 1.29 07/12/2010 0935      Component Value Date/Time   CALCIUM 8.5 11/15/2010 1310   CALCIUM 9.7 07/12/2010 0935   ALKPHOS 69 07/10/2009 0942   AST 25 07/10/2009 0942   ALT 17 07/10/2009 0942   BILITOT 0.7 07/10/2009 0942         Impression and Plan: Donald Kaiser is an 80 year old gentleman. His polycythemia vera.  I think we can get away without phlebotomizing him right now.  We will plan him back in2 months. At that point , he will likely need to be phlebotomized.  We gave him a prayer blanket for his wife. They have been married 5 years. She always comes with him. His daughter came with him today. We tried to cheer him up.   Volanda Napoleon, MD 5/7/20158:48 AM

## 2013-09-02 ENCOUNTER — Encounter: Payer: Self-pay | Admitting: Critical Care Medicine

## 2013-09-02 ENCOUNTER — Ambulatory Visit (INDEPENDENT_AMBULATORY_CARE_PROVIDER_SITE_OTHER): Payer: Medicare Other | Admitting: Critical Care Medicine

## 2013-09-02 VITALS — BP 130/74 | HR 66 | Temp 98.5°F | Ht 67.5 in | Wt 214.0 lb

## 2013-09-02 DIAGNOSIS — J45909 Unspecified asthma, uncomplicated: Secondary | ICD-10-CM

## 2013-09-02 NOTE — Patient Instructions (Signed)
No change in medications. Return in        6 months        

## 2013-09-02 NOTE — Progress Notes (Signed)
Subjective:    Patient ID: Donald Kaiser, male    DOB: [birth year], 80 y.o.   MRN: [id number]  HPI  80 y.o.WM Asthma hx.  PUL ASTHMA HISTORY 09/02/2013 04/06/2013 12/02/2012 05/28/2012 11/25/2011  Symptoms 0-2 days/week Daily 0-2 days/week 0-2 days/week >2 days/week  Nighttime awakenings 0-2/month >1/wk but not nightly 0-2/month 0-2/month 0-2/month  Interference with activity No limitations Minor limitations No limitations Minor limitations Minor limitations  SABA use 0-2 days/wk Several times/day 0-2 days/wk 0-2 days/wk 0-2 days/wk  Exacerbations requiring oral steroids 0-1 / year 0-1 / year 0-1 / year 0-1 / year 0-1 / year     09/02/2013 Chief Complaint  Patient presents with  . 4 month follow up    Breathing doing well overall.  DOE is at baseline.  Occas wheezing but not often.  Coughing a litttle - nonprod.  No chest tightness/pain.   Doing well so far. At baseline Pt denies any significant sore throat, nasal congestion or excess secretions, fever, chills, sweats, unintended weight loss, pleurtic or exertional chest pain, orthopnea PND, or leg swelling Pt denies any increase in rescue therapy over baseline, denies waking up needing it or having any early am or nocturnal exacerbations of coughing/wheezing/or dyspnea. Pt also denies any obvious fluctuation in symptoms with  weather or environmental change or other alleviating or aggravating factors  Review of Systems  Constitutional:   No  weight loss, night sweats,  Fevers, chills, + fatigue, lassitude. HEENT:   No headaches,  Difficulty swallowing,  Tooth/dental problems,  Sore throat,                No sneezing, itching, ear ache, nasal congestion, post nasal drip,   CV:  No chest pain,  Orthopnea, PND, swelling in lower extremities, anasarca, dizziness, palpitations  GI: occ heartburn, indigestion, abdominal pain, nausea, vomiting, diarrhea, change in bowel habits, loss of appetite  Resp: Notes  shortness of breath with  exertion not  at rest.  No excess mucus, no productive cough,     No coughing up of blood.  No change in color of mucus.  No wheezing.  No chest wall deformity  Skin: no rash or lesions.  GU: no dysuria, change in color of urine, no urgency or frequency.  No flank pain.  MS:  No joint pain or swelling.  No decreased range of motion.  No back pain.  Psych:  No change in mood or affect. No depression or anxiety.  No memory loss.     Objective:   Physical Exam   Filed Vitals:   09/02/13 0956  BP: 130/74  Pulse: 66  Temp: 98.5 F (36.9 C)  TempSrc: Oral  Height: 5' 7.5" (1.715 m)  Weight: 214 lb (97.07 kg)  SpO2: 97%    Gen: Pleasant, well-nourished, in no distress,  normal affect  ENT: No lesions,  mouth clear,  oropharynx clear,no postnasal drip  Neck: No JVD, no TMG, no carotid bruits  Lungs: No use of accessory muscles, no dullness to percussion,  No wheezes  Cardiovascular: RRR, heart sounds normal, no murmur or gallops, no peripheral edema  Abdomen: soft and NT, no HSM,  BS normal  Musculoskeletal: No deformities, no cyanosis or clubbing  Neuro: alert, non focal  Skin: Warm, no lesions or rashes     Assessment & Plan:    Moderate persistent asthma Moderate persistent asthma , stable at present Plan No change in medications. Return in        6 months  Updated Medication List Outpatient Encounter Prescriptions as of 09/02/2013  Medication Sig  . albuterol-ipratropium (COMBIVENT) 18-103 MCG/ACT inhaler Inhale 1-2 puffs into the lungs every 4 (four) hours as needed.    [name] aspirin 81 MG tablet Take 81 mg by mouth daily.    [name] desonide (DESOWEN) 0.05 % cream Apply topically as needed.   . Ergocalciferol (VITAMIN D2) 2000 UNITS TABS Take 1 tablet by mouth daily.    [name] exenatide (BYETTA) 10 MCG/0.04ML SOLN Inject into the skin every morning.   . fluocinonide (LIDEX) 0.05 % external solution Apply topically as needed.   . furosemide (LASIX) 40 MG tablet Take  20 mg by mouth as needed.   . insulin NPH-insulin regular (NOVOLIN 70/30) (70-30) 100 UNIT/ML injection Inject into the skin daily with breakfast. 23 units in the morning And 5 units in evening  . metoprolol (LOPRESSOR) 50 MG tablet Take 50 mg by mouth daily.   . mometasone (ASMANEX 120 METERED DOSES) 220 MCG/INH inhaler Inhale 2 puffs into the lungs daily.   . mometasone (NASONEX) 50 MCG/ACT nasal spray Place 2 sprays into the nose daily.  . montelukast (SINGULAIR) 10 MG tablet Take 10 mg by mouth daily.    . Multiple Vitamins-Minerals (PRESERVISION AREDS PO) Take 1 capsule by mouth 2 (two) times daily.    . nitroGLYCERIN (NITROSTAT) 0.4 MG SL tablet Place 0.4 mg under the tongue every 5 (five) minutes as needed.    . simvastatin (ZOCOR) 40 MG tablet Take 40 mg by mouth at bedtime.    . traMADol (ULTRAM) 50 MG tablet Take 50 mg by mouth every 6 (six) hours as needed.  . travoprost, benzalkonium, (TRAVATAN) 0.004 % ophthalmic solution Place 1 drop into both eyes daily.   . [DISCONTINUED] levofloxacin (LEVAQUIN) 500 MG tablet Take 1 tablet (500 mg total) by mouth daily.          Updated Medication List Outpatient Encounter Prescriptions as of 09/02/2013  Medication Sig  . albuterol-ipratropium (COMBIVENT) 18-103 MCG/ACT inhaler Inhale 1-2 puffs into the lungs every 4 (four) hours as needed.    [name] aspirin 81 MG tablet Take 81 mg by mouth daily.    [name] desonide (DESOWEN) 0.05 % cream Apply topically as needed.   . Ergocalciferol (VITAMIN D2) 2000 UNITS TABS Take 1 tablet by mouth daily.    [name] exenatide (BYETTA) 10 MCG/0.04ML SOLN Inject into the skin every morning.   . fluocinonide (LIDEX) 0.05 % external solution Apply topically as needed.   . furosemide (LASIX) 40 MG tablet Take 20 mg by mouth as needed.   . insulin NPH-insulin regular (NOVOLIN 70/30) (70-30) 100 UNIT/ML injection Inject into the skin daily with breakfast. 23 units in the morning And 5 units in evening  . metoprolol  (LOPRESSOR) 50 MG tablet Take 50 mg by mouth daily.   . mometasone (ASMANEX 120 METERED DOSES) 220 MCG/INH inhaler Inhale 2 puffs into the lungs daily.   . mometasone (NASONEX) 50 MCG/ACT nasal spray Place 2 sprays into the nose daily.  . montelukast (SINGULAIR) 10 MG tablet Take 10 mg by mouth daily.    . Multiple Vitamins-Minerals (PRESERVISION AREDS PO) Take 1 capsule by mouth 2 (two) times daily.    . nitroGLYCERIN (NITROSTAT) 0.4 MG SL tablet Place 0.4 mg under the tongue every 5 (five) minutes as needed.    . simvastatin (ZOCOR) 40 MG tablet Take 40 mg by mouth at bedtime.    . traMADol (ULTRAM) 50 MG tablet Take 50 mg by  mouth every 6 (six) hours as needed.  . travoprost, benzalkonium, (TRAVATAN) 0.004 % ophthalmic solution Place 1 drop into both eyes daily.   . [DISCONTINUED] levofloxacin (LEVAQUIN) 500 MG tablet Take 1 tablet (500 mg total) by mouth daily.

## 2013-09-02 NOTE — Assessment & Plan Note (Signed)
Moderate persistent asthma , stable at present Plan No change in medications. Return in        6 months

## 2013-10-13 ENCOUNTER — Other Ambulatory Visit (HOSPITAL_BASED_OUTPATIENT_CLINIC_OR_DEPARTMENT_OTHER): Payer: Medicare Other | Admitting: Lab

## 2013-10-13 ENCOUNTER — Encounter: Payer: Self-pay | Admitting: Hematology & Oncology

## 2013-10-13 ENCOUNTER — Ambulatory Visit (HOSPITAL_BASED_OUTPATIENT_CLINIC_OR_DEPARTMENT_OTHER): Payer: Medicare Other

## 2013-10-13 ENCOUNTER — Ambulatory Visit (HOSPITAL_BASED_OUTPATIENT_CLINIC_OR_DEPARTMENT_OTHER): Payer: Medicare Other | Admitting: Hematology & Oncology

## 2013-10-13 VITALS — BP 150/77 | HR 79 | Temp 98.3°F | Resp 18 | Ht 67.0 in | Wt 210.0 lb

## 2013-10-13 VITALS — BP 141/81 | HR 62 | Resp 20

## 2013-10-13 DIAGNOSIS — D45 Polycythemia vera: Secondary | ICD-10-CM | POA: Diagnosis not present

## 2013-10-13 LAB — CBC WITH DIFFERENTIAL (CANCER CENTER ONLY)
BASO#: 0 10*3/uL (ref 0.0–0.2)
BASO%: 0.3 % (ref 0.0–2.0)
EOS ABS: 0.1 10*3/uL (ref 0.0–0.5)
EOS%: 1.4 % (ref 0.0–7.0)
HEMATOCRIT: 49.4 % (ref 38.7–49.9)
HEMOGLOBIN: 16.6 g/dL (ref 13.0–17.1)
LYMPH#: 2.4 10*3/uL (ref 0.9–3.3)
LYMPH%: 31.6 % (ref 14.0–48.0)
MCH: 29.7 pg (ref 28.0–33.4)
MCHC: 33.6 g/dL (ref 32.0–35.9)
MCV: 88 fL (ref 82–98)
MONO#: 0.8 10*3/uL (ref 0.1–0.9)
MONO%: 10.1 % (ref 0.0–13.0)
NEUT#: 4.4 10*3/uL (ref 1.5–6.5)
NEUT%: 56.6 % (ref 40.0–80.0)
Platelets: 108 10*3/uL — ABNORMAL LOW (ref 145–400)
RBC: 5.59 10*6/uL (ref 4.20–5.70)
RDW: 14.5 % (ref 11.1–15.7)
WBC: 7.7 10*3/uL (ref 4.0–10.0)

## 2013-10-13 NOTE — Progress Notes (Signed)
[facility]  [phone] [phone]  ID: Donald Kaiser OB: May 13, 1926 MR#: [id number]#:[id number] Patient Care Team: [name], MD as PCP - General Elsie Stain, MD (Pulmonary Disease)  DIAGNOSIS: Polycythemia vera  INTERVAL HISTORY: Donald Kaiser is a very pleasant 80 yo male back today with his daughter for a 2 month follow-up.  When last seen in May he did not require phlebotomy. He states that he still feels tired a lot of the time but that this is nothing new. He is an insulin-dependent diabetic and his blood sugars have been doing all right. He's had chronic back issues. He denies fever, chills, headache, dizziness, blurred vision, SOB, chest pain, palpitations, constipation, diarrhea, problems urinating, blood in stool or urine. He denies having any swelling, tenderness, numbness or tingling in his extremities. He has no new rashes or cough.   CURRENT TREATMENT: Phlebotomy to maintain hematocrit low 45%  Aspirin 81 mg by mouth daily  REVIEW OF SYSTEMS: All other 10 point review of systems negative except for those issues mentioned above.  PAST MEDICAL HISTORY: Past Medical History  Diagnosis Date  . OSA (obstructive sleep apnea)   . GERD (gastroesophageal reflux disease)   . Diabetes mellitus type II   . Asthma   . DVT (deep venous thrombosis)     chronic  . Post-phlebitic syndrome   . Polycythemia vera(238.4)     Ennever  . COPD (chronic obstructive pulmonary disease)   . Hypertension    PAST SURGICAL HISTORY: Past Surgical History  Procedure Laterality Date  . Tonsillectomy    . Artrial bypass      x 5  . Back surgery    . Broken leg surgery     FAMILY HISTORY Family History  Problem Relation Age of Onset  . Emphysema Sister   . Allergies Sister   . Asthma Sister   . Heart disease Brother   . Heart disease Brother   . Rheum arthritis Sister   . Rheum arthritis Mother   . Cancer Mother   . Cancer Sister   .  Kidney failure Father   . Stroke Sister    SOCIAL HISTORY:  History   Social History  . Marital Status: Married    Spouse Name: N/A    Number of Children: N/A  . Years of Education: N/A   Occupational History  . Retired     Company secretary   Social History Main Topics  . Smoking status: Never Smoker   . Smokeless tobacco: Never Used     Comment: never used tobaccco  . Alcohol Use: No  . Drug Use: No  . Sexual Activity: Not on file   Other Topics Concern  . Not on file   Social History Narrative  . No narrative on file   ADVANCED DIRECTIVES: <no information>  HEALTH MAINTENANCE: History  Substance Use Topics  . Smoking status: Never Smoker   . Smokeless tobacco: Never Used     Comment: never used tobaccco  . Alcohol Use: No   Colonoscopy: PAP: Bone density: Lipid panel:  No Known Allergies  Current Outpatient Prescriptions  Medication Sig Dispense Refill  . albuterol-ipratropium (COMBIVENT) 18-103 MCG/ACT inhaler Inhale 1-2 puffs into the lungs every 4 (four) hours as needed.        [name] aspirin 81 MG tablet Take 81 mg by mouth daily.        [name] desonide (DESOWEN) 0.05 % cream Apply topically as needed.       [name]  Ergocalciferol (VITAMIN D2) 2000 UNITS TABS Take 1 tablet by mouth daily.        [name] exenatide (BYETTA) 10 MCG/0.04ML SOLN Inject into the skin every morning.       . fluocinonide (LIDEX) 0.05 % external solution Apply topically as needed.       . furosemide (LASIX) 40 MG tablet Take 20 mg by mouth as needed.       . insulin NPH-insulin regular (NOVOLIN 70/30) (70-30) 100 UNIT/ML injection Inject into the skin daily with breakfast. 20 units in the morning And 5 units in evening      . metoprolol (LOPRESSOR) 50 MG tablet Take 50 mg by mouth daily.       . mometasone (ASMANEX 120 METERED DOSES) 220 MCG/INH inhaler Inhale 2 puffs into the lungs daily.       . mometasone (NASONEX) 50 MCG/ACT nasal spray Place 2 sprays into the nose daily.  17 g    . montelukast  (SINGULAIR) 10 MG tablet Take 10 mg by mouth daily.        . Multiple Vitamins-Minerals (PRESERVISION AREDS PO) Take 1 capsule by mouth 2 (two) times daily.        . nitroGLYCERIN (NITROSTAT) 0.4 MG SL tablet Place 0.4 mg under the tongue every 5 (five) minutes as needed.        . simvastatin (ZOCOR) 40 MG tablet Take 40 mg by mouth at bedtime.        . traMADol (ULTRAM) 50 MG tablet Take 50 mg by mouth every 6 (six) hours as needed.      . travoprost, benzalkonium, (TRAVATAN) 0.004 % ophthalmic solution Place 1 drop into both eyes daily.        No current facility-administered medications for this visit.   OBJECTIVE: Filed Vitals:   10/13/13 1055  BP: 150/77  Pulse: 79  Temp: 98.3 F (36.8 C)  Resp: 18   Body mass index is 32.88 kg/(m^2). ECOG FS:0 - Asymptomatic Ocular: Sclerae unicteric, pupils equal, round and reactive to light Ear-nose-throat: Oropharynx clear, dentition fair Lymphatic: No cervical or supraclavicular adenopathy Lungs no rales or rhonchi, good excursion bilaterally Heart regular rate and rhythm, no murmur appreciated Abd soft, nontender, positive bowel sounds MSK no focal spinal tenderness, no joint edema Neuro: non-focal, well-oriented, appropriate affect  LAB RESULTS: CMP     Component Value Date/Time   NA 137 11/15/2010 1310   NA 139 07/12/2010 0935   K 4.1 11/15/2010 1310   K 4.0 07/12/2010 0935   CL 102 11/15/2010 1310   CL 101 07/12/2010 0935   CO2 28 11/15/2010 1310   CO2 27 07/12/2010 0935   GLUCOSE 99 11/15/2010 1310   GLUCOSE 197* 07/12/2010 0935   BUN 19 11/15/2010 1310   BUN 26* 07/12/2010 0935   CREATININE 1.4* 11/15/2010 1310   CREATININE 1.29 07/12/2010 0935   CALCIUM 8.5 11/15/2010 1310   CALCIUM 9.7 07/12/2010 0935   PROT 6.2 07/10/2009 0942   ALBUMIN 4.1 07/10/2009 0942   AST 25 07/10/2009 0942   ALT 17 07/10/2009 0942   ALKPHOS 69 07/10/2009 0942   BILITOT 0.7 07/10/2009 0942   GFRNONAA 49* 03/12/2010 1119   GFRAA  Value: 59        The eGFR has been calculated using  the MDRD equation. This calculation has not been validated in all clinical situations. eGFR's persistently <60 mL/min signify possible Chronic Kidney Disease.* 03/12/2010 1119   No results found for this basename: SPEP, UPEP,  kappa and lambda light chains   Lab Results  Component Value Date   WBC 7.7 10/13/2013   NEUTROABS 4.4 10/13/2013   HGB 16.6 10/13/2013   HCT 49.4 10/13/2013   MCV 88 10/13/2013   PLT 108* 10/13/2013   No results found for this basename: LABCA2   No components found with this basename: LABCA125   No results found for this basename: INR,  in the last 168 hours Urinalysis No results found for this basename: colorurine, appearanceur, labspec, phurine, glucoseu, hgbur, bilirubinur, ketonesur, proteinur, urobilinogen, nitrite, leukocytesur   STUDIES: No results found.  ASSESSMENT/PLAN: Donald Kaiser is an pleasant 80 year old gentleman with polycythemia vera.   We discussed his CBC in detail. His Hct is 49.4 and he will be phlebotomized today.   We will see him again in 2 months  For labs and a follow-up appointment.   Plan discussed with the patient and his daughter and they are in agreement. All questions were answered. He knows to call here with any new questions or concerns and to go to the ED in the event of an emergency. We can certainly see him sooner of need be.   Eliezer Bottom, NP 10/13/2013 11:55 AM

## 2013-10-13 NOTE — Patient Instructions (Signed)
Therapeutic Phlebotomy Care After Refer to this sheet in the next few weeks. These instructions provide you with information on caring for yourself after your procedure. Your caregiver may also give you more specific instructions. Your treatment has been planned according to current medical practices, but problems sometimes occur. Call your caregiver if you have any problems or questions after your procedure. HOME CARE INSTRUCTIONS Most people can go back to their normal activities right away. Before you leave, be sure to ask if there is anything you should or should not do. In general, it would be wise to:  Keep the bandage dry. You can remove the bandage after about 5 hours.  Eat well-balanced meals for the next 24 hours.  Drink enough fluids to keep your urine clear or pale yellow.  Avoid drinking alcohol minimally until after eating.  Avoid smoking for at least 30 minutes after the procedure.  Avoid strenous physical activity or heavy lifting or pulling for about 5 hours after the procedure.  Athletes should avoid strenous exercise for 12 hours or more.  Change positions slowly for the remainder of the day to prevent lightheadedness or fainting.  If you feel lightheaded, lie down until the feeling subsides.  If you have bleeding from the needle insertion site, elevate your arm and press firmly on the site until the bleeding stops.  If bruising or bleeding appears under the skin, apply ice to the area for 15 to 20 minutes, 3 to 4 times per day. Put the ice in a plastic bag and place a towel between the bag of ice and your skin. Do this while you are awake for the first 24 hours. The ice packs can be stopped before 24 hours if the swelling goes away. If swelling persists after 24 hours, a warm, moist washcloth can be applied to the area for 15 to 20 minutes, 3 to 4 times per day. The warm, moist treatments can be stopped when the swelling goes away.  It is important to continue further  therapeutic phlebotomy as directed by your caregiver. SEEK MEDICAL CARE IF:  There is bleeding or fluid leaking from the needle insertion site.  The needle insertion site becomes swollen, red, or sore.  You feel lightheaded, dizzy or nauseated, and the feeling does not go away.  You notice new bruising at the needle insertion site.  You feel more weak or tired than normal.  You develop a fever. SEEK IMMEDIATE MEDICAL CARE IF:   There is increased bleeding, pain, or swelling from the needle insertion site.  You have severe nausea or vomiting.  You have chest pain.  You have trouble breathing. MAKE SURE YOU:  Understand these instructions.  Will watch your condition.  Will get help right away if you are not doing well or get worse. Document Released: 08/27/2010 Document Revised: 06/17/2011 Document Reviewed: 08/27/2010 Exit[employer] Patient Information 2015 [employer]. This information is not intended to replace advice given to you by your health care provider. Make sure you discuss any questions you have with your health care provider.  

## 2013-10-13 NOTE — Progress Notes (Signed)
Donald Kaiser presents today for phlebotomy per MD orders. Phlebotomy procedure started at 1150 and ended at 1215. 500 mls removed. Patient observed for 30 minutes after procedure without any incident. Patient tolerated procedure well. IV needle removed intact.

## 2013-12-22 ENCOUNTER — Ambulatory Visit (HOSPITAL_BASED_OUTPATIENT_CLINIC_OR_DEPARTMENT_OTHER): Payer: Medicare Other | Admitting: Hematology & Oncology

## 2013-12-22 ENCOUNTER — Other Ambulatory Visit (HOSPITAL_BASED_OUTPATIENT_CLINIC_OR_DEPARTMENT_OTHER): Payer: Medicare Other | Admitting: Lab

## 2013-12-22 ENCOUNTER — Encounter: Payer: Self-pay | Admitting: Hematology & Oncology

## 2013-12-22 ENCOUNTER — Ambulatory Visit (HOSPITAL_BASED_OUTPATIENT_CLINIC_OR_DEPARTMENT_OTHER): Payer: Medicare Other

## 2013-12-22 VITALS — BP 187/72 | HR 83 | Temp 97.3°F | Resp 18 | Ht 66.0 in | Wt 213.0 lb

## 2013-12-22 VITALS — BP 112/58 | HR 59 | Temp 98.0°F | Resp 12

## 2013-12-22 DIAGNOSIS — D45 Polycythemia vera: Secondary | ICD-10-CM

## 2013-12-22 LAB — CBC WITH DIFFERENTIAL (CANCER CENTER ONLY)
BASO#: 0 10*3/uL (ref 0.0–0.2)
BASO%: 0.2 % (ref 0.0–2.0)
EOS%: 2.6 % (ref 0.0–7.0)
Eosinophils Absolute: 0.2 10*3/uL (ref 0.0–0.5)
HEMATOCRIT: 46.9 % (ref 38.7–49.9)
HGB: 15.5 g/dL (ref 13.0–17.1)
LYMPH#: 1.3 10*3/uL (ref 0.9–3.3)
LYMPH%: 21.6 % (ref 14.0–48.0)
MCH: 29.6 pg (ref 28.0–33.4)
MCHC: 33 g/dL (ref 32.0–35.9)
MCV: 90 fL (ref 82–98)
MONO#: 0.6 10*3/uL (ref 0.1–0.9)
MONO%: 9.4 % (ref 0.0–13.0)
NEUT#: 3.9 10*3/uL (ref 1.5–6.5)
NEUT%: 66.2 % (ref 40.0–80.0)
Platelets: 107 10*3/uL — ABNORMAL LOW (ref 145–400)
RBC: 5.24 10*6/uL (ref 4.20–5.70)
RDW: 13.7 % (ref 11.1–15.7)
WBC: 5.9 10*3/uL (ref 4.0–10.0)

## 2013-12-22 LAB — FERRITIN CHCC: Ferritin: 27 ng/ml (ref 22–316)

## 2013-12-22 NOTE — Progress Notes (Signed)
Donald Kaiser presents today for phlebotomy per MD orders. Phlebotomy procedure started at 1215 and ended at 1225. 246mL removed. IV clotted off. IV removed and intact.  Patient tolerated procedure well. IV needle removed intact.    Donald Kaiser presents today for phlebotomy per MD orders. Phlebotomy procedure started at 1230 and ended at 1235. 389mL removed. Patient observed for 30 minutes after procedure without any incident. Patient tolerated procedure well. IV needle removed intact.

## 2013-12-22 NOTE — Patient Instructions (Signed)

## 2013-12-24 NOTE — Progress Notes (Signed)
[place]  Telephone:(336) [phone] Fax:(336) [phone]  ID: Donald Kaiser OB: [birth year] MR#: [id number] Patient Care Team: [name], MD as PCP - General Elsie Stain, MD (Pulmonary Disease)  DIAGNOSIS: Polycythemia vera  INTERVAL HISTORY: Donald Kaiser is a very pleasant 80 yo male back today  for a 2 month follow-up.  When last seen in  July, he did not require phlebotomy. He states that he still feels tired a lot of the time but that this is nothing new. He is an insulin-dependent diabetic and his blood sugars have been doing all right. He's had chronic back issues. He denies fever, chills, headache, dizziness, blurred vision, SOB, chest pain, palpitations, constipation, diarrhea, problems urinating, blood in stool or urine. He denies having any swelling, tenderness, numbness or tingling in his extremities. He has no new rashes or cough.   CURRENT TREATMENT: Phlebotomy to maintain hematocrit low 45%  Aspirin 81 mg by mouth daily  REVIEW OF SYSTEMS: All other 10 point review of systems negative except for those issues mentioned above.  PAST MEDICAL HISTORY: Past Medical History  Diagnosis Date  . OSA (obstructive sleep apnea)   . GERD (gastroesophageal reflux disease)   . Diabetes mellitus type II   . Asthma   . DVT (deep venous thrombosis)     chronic  . Post-phlebitic syndrome   . Polycythemia vera(238.4)     Ennever  . COPD (chronic obstructive pulmonary disease)   . Hypertension    PAST SURGICAL HISTORY: Past Surgical History  Procedure Laterality Date  . Tonsillectomy    . Artrial bypass      x 5  . Back surgery    . Broken leg surgery     FAMILY HISTORY Family History  Problem Relation Age of Onset  . Emphysema Sister   . Allergies Sister   . Asthma Sister   . Heart disease Brother   . Heart disease Brother   . Rheum arthritis Sister   . Rheum arthritis Mother   . Cancer Mother   . Cancer Sister   . Kidney failure  Father   . Stroke Sister    SOCIAL HISTORY:  History   Social History  . Marital Status: Married    Spouse Name: N/A    Number of Children: N/A  . Years of Education: N/A   Occupational History  . Retired     Company secretary   Social History Main Topics  . Smoking status: Never Smoker   . Smokeless tobacco: Never Used     Comment: never used tobaccco  . Alcohol Use: No  . Drug Use: No  . Sexual Activity: Not on file   Other Topics Concern  . Not on file   Social History Narrative  . No narrative on file   ADVANCED DIRECTIVES: <no information>  HEALTH MAINTENANCE: History  Substance Use Topics  . Smoking status: Never Smoker   . Smokeless tobacco: Never Used     Comment: never used tobaccco  . Alcohol Use: No   Colonoscopy: PAP: Bone density: Lipid panel:  No Known Allergies  Current Outpatient Prescriptions  Medication Sig Dispense Refill  . albuterol-ipratropium (COMBIVENT) 18-103 MCG/ACT inhaler Inhale 1-2 puffs into the lungs every 4 (four) hours as needed.        [name] aspirin 81 MG tablet Take 81 mg by mouth daily.        [name] desonide (DESOWEN) 0.05 % cream Apply topically as needed.       [name]  Ergocalciferol (VITAMIN D2) 2000 UNITS TABS Take 1 tablet by mouth daily.        [name] exenatide (BYETTA) 10 MCG/0.04ML SOLN Inject into the skin every morning.       . fluocinonide (LIDEX) 0.05 % external solution Apply topically as needed.       . furosemide (LASIX) 40 MG tablet Take 20 mg by mouth as needed.       . insulin NPH-insulin regular (NOVOLIN 70/30) (70-30) 100 UNIT/ML injection Inject into the skin daily with breakfast. 20 units in the morning And 5 units in evening      . metoprolol (LOPRESSOR) 50 MG tablet Take 50 mg by mouth daily.       . mometasone (ASMANEX 120 METERED DOSES) 220 MCG/INH inhaler Inhale 2 puffs into the lungs daily.       . mometasone (NASONEX) 50 MCG/ACT nasal spray Place 2 sprays into the nose daily.  17 g    . montelukast (SINGULAIR) 10 MG  tablet Take 10 mg by mouth daily.        . Multiple Vitamins-Minerals (PRESERVISION AREDS PO) Take 1 capsule by mouth 2 (two) times daily.        . nitroGLYCERIN (NITROSTAT) 0.4 MG SL tablet Place 0.4 mg under the tongue every 5 (five) minutes as needed.        . simvastatin (ZOCOR) 40 MG tablet Take 40 mg by mouth at bedtime.        . traMADol (ULTRAM) 50 MG tablet Take 50 mg by mouth every 6 (six) hours as needed.      . travoprost, benzalkonium, (TRAVATAN) 0.004 % ophthalmic solution Place 1 drop into both eyes daily.        No current facility-administered medications for this visit.   OBJECTIVE: Filed Vitals:   12/22/13 1102  BP: 187/72  Pulse: 83  Temp: 97.3 F (36.3 C)  Resp: 18   Body mass index is 34.4 kg/(m^2). ECOG FS:0 - Asymptomatic Ocular: Sclerae unicteric, pupils equal, round and reactive to light Ear-nose-throat: Oropharynx clear, dentition fair Lymphatic: No cervical or supraclavicular adenopathy Lungs no rales or rhonchi, good excursion bilaterally Heart regular rate and rhythm, no murmur appreciated Abd soft, nontender, positive bowel sounds MSK no focal spinal tenderness, no joint edema Neuro: non-focal, well-oriented, appropriate affect  LAB RESULTS: CMP     Component Value Date/Time   NA 137 11/15/2010 1310   NA 139 07/12/2010 0935   K 4.1 11/15/2010 1310   K 4.0 07/12/2010 0935   CL 102 11/15/2010 1310   CL 101 07/12/2010 0935   CO2 28 11/15/2010 1310   CO2 27 07/12/2010 0935   GLUCOSE 99 11/15/2010 1310   GLUCOSE 197* 07/12/2010 0935   BUN 19 11/15/2010 1310   BUN 26* 07/12/2010 0935   CREATININE 1.4* 11/15/2010 1310   CREATININE 1.29 07/12/2010 0935   CALCIUM 8.5 11/15/2010 1310   CALCIUM 9.7 07/12/2010 0935   PROT 6.2 07/10/2009 0942   ALBUMIN 4.1 07/10/2009 0942   AST 25 07/10/2009 0942   ALT 17 07/10/2009 0942   ALKPHOS 69 07/10/2009 0942   BILITOT 0.7 07/10/2009 0942   GFRNONAA 49* 03/12/2010 1119   GFRAA  Value: 59        The eGFR has been calculated using the MDRD equation.  This calculation has not been validated in all clinical situations. eGFR's persistently <60 mL/min signify possible Chronic Kidney Disease.* 03/12/2010 1119   No results found for this basename: SPEP,  UPEP,  kappa and lambda light chains   Lab Results  Component Value Date   WBC 5.9 12/22/2013   NEUTROABS 3.9 12/22/2013   HGB 15.5 12/22/2013   HCT 46.9 12/22/2013   MCV 90 12/22/2013   PLT 107* 12/22/2013   No results found for this basename: LABCA2   No components found with this basename: LABCA125   No results found for this basename: INR,  in the last 168 hours Urinalysis No results found for this basename: colorurine,  appearanceur,  labspec,  phurine,  glucoseu,  hgbur,  bilirubinur,  ketonesur,  proteinur,  urobilinogen,  nitrite,  leukocytesur   STUDIES: No results found.  ASSESSMENT/PLAN: Mr. Witkop is a pleasant 80 year old gentleman with polycythemia vera.   We discussed his CBC in detail. His Hct is 46.9. and he will be phlebotomized today.   We will see him again in 2 months     Plan discussed with the patient and his wife and they are in agreement. All questions were answered. He knows to call here with any new questions or concerns and to go to the ED in the event of an emergency. We can certainly see him sooner of need be.   Volanda Napoleon, MD 12/24/2013 6:50 AM

## 2014-03-04 ENCOUNTER — Encounter: Payer: Self-pay | Admitting: Hematology & Oncology

## 2014-03-04 ENCOUNTER — Ambulatory Visit: Payer: Medicare Other

## 2014-03-04 ENCOUNTER — Ambulatory Visit (HOSPITAL_BASED_OUTPATIENT_CLINIC_OR_DEPARTMENT_OTHER): Payer: Medicare Other | Admitting: Hematology & Oncology

## 2014-03-04 ENCOUNTER — Other Ambulatory Visit (HOSPITAL_BASED_OUTPATIENT_CLINIC_OR_DEPARTMENT_OTHER): Payer: Medicare Other | Admitting: Lab

## 2014-03-04 VITALS — BP 148/51 | HR 79 | Temp 98.4°F | Resp 20 | Wt 210.0 lb

## 2014-03-04 DIAGNOSIS — D45 Polycythemia vera: Secondary | ICD-10-CM

## 2014-03-04 LAB — CMP (CANCER CENTER ONLY)
ALT: 15 U/L (ref 10–47)
AST: 21 U/L (ref 11–38)
Albumin: 3.6 g/dL (ref 3.3–5.5)
Alkaline Phosphatase: 70 U/L (ref 26–84)
BUN, Bld: 21 mg/dL (ref 7–22)
CALCIUM: 8.7 mg/dL (ref 8.0–10.3)
CHLORIDE: 104 meq/L (ref 98–108)
CO2: 28 meq/L (ref 18–33)
Creat: 1.5 mg/dl — ABNORMAL HIGH (ref 0.6–1.2)
Glucose, Bld: 123 mg/dL — ABNORMAL HIGH (ref 73–118)
Potassium: 3.7 mEq/L (ref 3.3–4.7)
Sodium: 144 mEq/L (ref 128–145)
Total Bilirubin: 0.8 mg/dl (ref 0.20–1.60)
Total Protein: 6.6 g/dL (ref 6.4–8.1)

## 2014-03-04 LAB — CBC WITH DIFFERENTIAL (CANCER CENTER ONLY)
BASO#: 0 10*3/uL (ref 0.0–0.2)
BASO%: 0.1 % (ref 0.0–2.0)
EOS ABS: 0.1 10*3/uL (ref 0.0–0.5)
EOS%: 1.9 % (ref 0.0–7.0)
HCT: 46.2 % (ref 38.7–49.9)
HGB: 15.6 g/dL (ref 13.0–17.1)
LYMPH#: 1.3 10*3/uL (ref 0.9–3.3)
LYMPH%: 19.2 % (ref 14.0–48.0)
MCH: 29.4 pg (ref 28.0–33.4)
MCHC: 33.8 g/dL (ref 32.0–35.9)
MCV: 87 fL (ref 82–98)
MONO#: 0.7 10*3/uL (ref 0.1–0.9)
MONO%: 9.6 % (ref 0.0–13.0)
NEUT#: 4.7 10*3/uL (ref 1.5–6.5)
NEUT%: 69.2 % (ref 40.0–80.0)
PLATELETS: 107 10*3/uL — AB (ref 145–400)
RBC: 5.3 10*6/uL (ref 4.20–5.70)
RDW: 13.6 % (ref 11.1–15.7)
WBC: 6.8 10*3/uL (ref 4.0–10.0)

## 2014-03-04 LAB — IRON AND TIBC CHCC
%SAT: 26 % (ref 20–55)
Iron: 78 ug/dL (ref 42–163)
TIBC: 295 ug/dL (ref 202–409)
UIBC: 217 ug/dL (ref 117–376)

## 2014-03-04 LAB — FERRITIN CHCC: FERRITIN: 23 ng/mL (ref 22–316)

## 2014-03-04 NOTE — Progress Notes (Signed)
MD aware that pt's HCT was 46.2 today and MD states the pt does not need a phlebotomy.

## 2014-03-04 NOTE — Progress Notes (Signed)
[place]  Telephone:(336) [phone] Fax:(336) [phone]  ID: Donald Kaiser OB: [birth year] MR#: [id number]#:[id number] Patient Care Team: [name], MD as PCP - General Elsie Stain, MD (Pulmonary Disease)  DIAGNOSIS: Polycythemia vera  INTERVAL HISTORY: Donald Kaiser is a very pleasant 80 yo male back today  for a 2 month follow-up.   We last saw him back in September. He has not been phlebotomized since September.  His wife just got out of the hospital. She has quite a few health issues herself.  His heart has been doing pretty good. He's had no problems with heart failure. He does have diabetes.  He did have a nice Thanksgiving yesterday.  He's had no fever. He's had no bleeding. There's been no change in bowel or bladder habits.  CURRENT TREATMENT: Phlebotomy to maintain hematocrit low 45%  Aspirin 81 mg by mouth daily  REVIEW OF SYSTEMS: All other 10 point review of systems negative except for those issues mentioned above.  PAST MEDICAL HISTORY: Past Medical History  Diagnosis Date  . OSA (obstructive sleep apnea)   . GERD (gastroesophageal reflux disease)   . Diabetes mellitus type II   . Asthma   . DVT (deep venous thrombosis)     chronic  . Post-phlebitic syndrome   . Polycythemia vera(238.4)     Donald Kaiser  . COPD (chronic obstructive pulmonary disease)   . Hypertension    PAST SURGICAL HISTORY: Past Surgical History  Procedure Laterality Date  . Tonsillectomy    . Artrial bypass      x 5  . Back surgery    . Broken leg surgery     FAMILY HISTORY Family History  Problem Relation Age of Onset  . Emphysema Sister   . Allergies Sister   . Asthma Sister   . Heart disease Brother   . Heart disease Brother   . Rheum arthritis Sister   . Rheum arthritis Mother   . Cancer Mother   . Cancer Sister   . Kidney failure Father   . Stroke Sister    SOCIAL HISTORY:  History   Social History  . Marital Status: Married    Spouse Name:  N/A    Number of Children: N/A  . Years of Education: N/A   Occupational History  . Retired     Company secretary   Social History Main Topics  . Smoking status: Never Smoker   . Smokeless tobacco: Never Used     Comment: never used tobaccco  . Alcohol Use: No  . Drug Use: No  . Sexual Activity: Not on file   Other Topics Concern  . Not on file   Social History Narrative   ADVANCED DIRECTIVES: <no information>  HEALTH MAINTENANCE: History  Substance Use Topics  . Smoking status: Never Smoker   . Smokeless tobacco: Never Used     Comment: never used tobaccco  . Alcohol Use: No   Colonoscopy: PAP: Bone density: Lipid panel:  No Known Allergies  Current Outpatient Prescriptions  Medication Sig Dispense Refill  . albuterol-ipratropium (COMBIVENT) 18-103 MCG/ACT inhaler Inhale 1-2 puffs into the lungs every 4 (four) hours as needed.      [name] aspirin 81 MG tablet Take 81 mg by mouth daily.      [name] desonide (DESOWEN) 0.05 % cream Apply topically as needed.     . Ergocalciferol (VITAMIN D2) 2000 UNITS TABS Take 1 tablet by mouth daily.      [name] exenatide (BYETTA) 10  MCG/0.04ML SOLN Inject into the skin every morning.     . fluocinonide (LIDEX) 0.05 % external solution Apply topically as needed.     . furosemide (LASIX) 40 MG tablet Take 20 mg by mouth as needed.     . insulin NPH-insulin regular (NOVOLIN 70/30) (70-30) 100 UNIT/ML injection Inject into the skin daily with breakfast. 20 units in the morning And 5 units in evening    . metoprolol (LOPRESSOR) 50 MG tablet Take 50 mg by mouth daily.     . montelukast (SINGULAIR) 10 MG tablet Take 10 mg by mouth daily.      . Multiple Vitamins-Minerals (PRESERVISION AREDS PO) Take 1 capsule by mouth 2 (two) times daily.      . nitroGLYCERIN (NITROSTAT) 0.4 MG SL tablet Place 0.4 mg under the tongue every 5 (five) minutes as needed.      . simvastatin (ZOCOR) 40 MG tablet Take 40 mg by mouth at bedtime.      . traMADol (ULTRAM) 50 MG tablet  Take 50 mg by mouth every 6 (six) hours as needed.    . travoprost, benzalkonium, (TRAVATAN) 0.004 % ophthalmic solution Place 1 drop into both eyes daily.     . mometasone (ASMANEX 120 METERED DOSES) 220 MCG/INH inhaler Inhale 2 puffs into the lungs daily.     . mometasone (NASONEX) 50 MCG/ACT nasal spray Place 2 sprays into the nose daily. 17 g    No current facility-administered medications for this visit.   OBJECTIVE: Filed Vitals:   03/04/14 1122  BP: 148/51  Pulse: 79  Temp: 98.4 F (36.9 C)  Resp: 20   Body mass index is 33.91 kg/(m^2). ECOG FS:0 - Asymptomatic Ocular: Sclerae unicteric, pupils equal, round and reactive to light Ear-nose-throat: Oropharynx clear, dentition fair Lymphatic: No cervical or supraclavicular adenopathy Lungs no rales or rhonchi, good excursion bilaterally Heart regular rate and rhythm, no murmur appreciated Abd soft, nontender, positive bowel sounds MSK no focal spinal tenderness, no joint edema Neuro: non-focal, well-oriented, appropriate affect  LAB RESULTS: CMP     Component Value Date/Time   NA 144 03/04/2014 1003   NA 139 07/12/2010 0935   K 3.7 03/04/2014 1003   K 4.0 07/12/2010 0935   CL 104 03/04/2014 1003   CL 101 07/12/2010 0935   CO2 28 03/04/2014 1003   CO2 27 07/12/2010 0935   GLUCOSE 123* 03/04/2014 1003   GLUCOSE 197* 07/12/2010 0935   BUN 21 03/04/2014 1003   BUN 26* 07/12/2010 0935   CREATININE 1.5* 03/04/2014 1003   CREATININE 1.29 07/12/2010 0935   CALCIUM 8.7 03/04/2014 1003   CALCIUM 9.7 07/12/2010 0935   PROT 6.6 03/04/2014 1003   PROT 6.2 07/10/2009 0942   ALBUMIN 4.1 07/10/2009 0942   AST 21 03/04/2014 1003   AST 25 07/10/2009 0942   ALT 15 03/04/2014 1003   ALT 17 07/10/2009 0942   ALKPHOS 70 03/04/2014 1003   ALKPHOS 69 07/10/2009 0942   BILITOT 0.80 03/04/2014 1003   BILITOT 0.7 07/10/2009 0942   GFRNONAA 49* 03/12/2010 1119   GFRAA * 03/12/2010 1119    59        The eGFR has been  calculated using the MDRD equation. This calculation has not been validated in all clinical situations. eGFR's persistently <60 mL/min signify possible Chronic Kidney Disease.   No results found for: SPEP Lab Results  Component Value Date   WBC 6.8 03/04/2014   NEUTROABS 4.7 03/04/2014   HGB 15.6 03/04/2014  HCT 46.2 03/04/2014   MCV 87 03/04/2014   PLT 107* 03/04/2014   No results found for: LABCA2 No components found for: [id number] No results for input(s): INR in the last 168 hours. Urinalysis No results found for: COLORURINE STUDIES: No results found.  ASSESSMENT/PLAN: Mr. Pagliuca is a pleasant 80 year old gentleman with polycythemia vera.   I think we can hold on phlebotomizing him.  We will see him again in 2 months       Volanda Napoleon, MD 03/04/2014 12:01 PM

## 2014-05-05 ENCOUNTER — Other Ambulatory Visit (HOSPITAL_BASED_OUTPATIENT_CLINIC_OR_DEPARTMENT_OTHER): Payer: Medicare Other | Admitting: Lab

## 2014-05-05 ENCOUNTER — Ambulatory Visit (HOSPITAL_BASED_OUTPATIENT_CLINIC_OR_DEPARTMENT_OTHER): Payer: Medicare Other

## 2014-05-05 ENCOUNTER — Ambulatory Visit (HOSPITAL_BASED_OUTPATIENT_CLINIC_OR_DEPARTMENT_OTHER): Payer: Medicare Other | Admitting: Hematology & Oncology

## 2014-05-05 ENCOUNTER — Encounter: Payer: Self-pay | Admitting: Hematology & Oncology

## 2014-05-05 VITALS — BP 167/79 | HR 79 | Temp 97.5°F

## 2014-05-05 VITALS — BP 136/72 | HR 72

## 2014-05-05 DIAGNOSIS — D45 Polycythemia vera: Secondary | ICD-10-CM

## 2014-05-05 LAB — IRON AND TIBC CHCC
%SAT: 28 % (ref 20–55)
Iron: 71 ug/dL (ref 42–163)
TIBC: 258 ug/dL (ref 202–409)
UIBC: 187 ug/dL (ref 117–376)

## 2014-05-05 LAB — CBC WITH DIFFERENTIAL (CANCER CENTER ONLY)
BASO#: 0 10*3/uL (ref 0.0–0.2)
BASO%: 0.2 % (ref 0.0–2.0)
EOS%: 1.7 % (ref 0.0–7.0)
Eosinophils Absolute: 0.1 10*3/uL (ref 0.0–0.5)
HEMATOCRIT: 48.4 % (ref 38.7–49.9)
HGB: 16.1 g/dL (ref 13.0–17.1)
LYMPH#: 1.3 10*3/uL (ref 0.9–3.3)
LYMPH%: 24.4 % (ref 14.0–48.0)
MCH: 29.2 pg (ref 28.0–33.4)
MCHC: 33.3 g/dL (ref 32.0–35.9)
MCV: 88 fL (ref 82–98)
MONO#: 0.5 10*3/uL (ref 0.1–0.9)
MONO%: 9.9 % (ref 0.0–13.0)
NEUT#: 3.4 10*3/uL (ref 1.5–6.5)
NEUT%: 63.8 % (ref 40.0–80.0)
Platelets: 108 10*3/uL — ABNORMAL LOW (ref 145–400)
RBC: 5.52 10*6/uL (ref 4.20–5.70)
RDW: 14.9 % (ref 11.1–15.7)
WBC: 5.4 10*3/uL (ref 4.0–10.0)

## 2014-05-05 LAB — COMPREHENSIVE METABOLIC PANEL (CC13)
ALT: 9 U/L (ref 0–55)
AST: 17 U/L (ref 5–34)
Albumin: 3.8 g/dL (ref 3.5–5.0)
Alkaline Phosphatase: 87 U/L (ref 40–150)
Anion Gap: 8 mEq/L (ref 3–11)
BUN: 20.6 mg/dL (ref 7.0–26.0)
CHLORIDE: 108 meq/L (ref 98–109)
CO2: 28 meq/L (ref 22–29)
CREATININE: 1.3 mg/dL (ref 0.7–1.3)
Calcium: 9.2 mg/dL (ref 8.4–10.4)
EGFR: 52 mL/min/{1.73_m2} — ABNORMAL LOW (ref >90)
Glucose: 67 mg/dl — ABNORMAL LOW (ref 70–140)
Potassium: 4 mEq/L (ref 3.5–5.1)
SODIUM: 143 meq/L (ref 136–145)
Total Bilirubin: 0.89 mg/dL (ref 0.20–1.20)
Total Protein: 6.5 g/dL (ref 6.4–8.3)

## 2014-05-05 LAB — FERRITIN CHCC: Ferritin: 44 ng/ml (ref 22–316)

## 2014-05-05 MED ORDER — SODIUM CHLORIDE 0.9 % IV SOLN
Freq: Once | INTRAVENOUS | Status: AC
  Administered 2014-05-05: 11:00:00 via INTRAVENOUS

## 2014-05-05 NOTE — Patient Instructions (Signed)

## 2014-05-05 NOTE — Progress Notes (Signed)
[place]  Telephone:(336) [phone] Fax:(336) [phone]  ID: Donald Kaiser OB: [birth year] MR#: [id number]#:[id number] Patient Care Team: [name], MD as PCP - General Elsie Stain, MD (Pulmonary Disease)  DIAGNOSIS: Polycythemia vera  INTERVAL HISTORY: Donald Kaiser is a very pleasant 81 yo male back today  for a 2 month follow-up.   We last saw him back in November.. He has not been phlebotomized since September.  His wife is doing better. Last time he was here, either she was having some issues.  He had a nice Christmas. They just stay home.  They were very careful with the snow last week.  He still has issues with his back. He has diabetes. He's had no fever. He's had no cough. He's had no nausea or vomiting. He's had no change in bowel or bladder habits. There's been some leg swelling but this is been chronic.[name]  CURRENT TREATMENT: Phlebotomy to maintain hematocrit low 45%  Aspirin 81 mg by mouth daily  REVIEW OF SYSTEMS: All other 10 point review of systems negative except for those issues mentioned above.  PAST MEDICAL HISTORY: Past Medical History  Diagnosis Date  . OSA (obstructive sleep apnea)   . GERD (gastroesophageal reflux disease)   . Diabetes mellitus type II   . Asthma   . DVT (deep venous thrombosis)     chronic  . Post-phlebitic syndrome   . Polycythemia vera(238.4)     Marasia Newhall  . COPD (chronic obstructive pulmonary disease)   . Hypertension    PAST SURGICAL HISTORY: Past Surgical History  Procedure Laterality Date  . Tonsillectomy    . Artrial bypass      x 5  . Back surgery    . Broken leg surgery     FAMILY HISTORY Family History  Problem Relation Age of Onset  . Emphysema Sister   . Allergies Sister   . Asthma Sister   . Heart disease Brother   . Heart disease Brother   . Rheum arthritis Sister   . Rheum arthritis Mother   . Cancer Mother   . Cancer Sister   . Kidney failure Father   . Stroke Sister     SOCIAL HISTORY:  History   Social History  . Marital Status: Married    Spouse Name: N/A    Number of Children: N/A  . Years of Education: N/A   Occupational History  . Retired     Company secretary   Social History Main Topics  . Smoking status: Never Smoker   . Smokeless tobacco: Never Used     Comment: never used tobaccco  . Alcohol Use: No  . Drug Use: No  . Sexual Activity: Not on file   Other Topics Concern  . Not on file   Social History Narrative   ADVANCED DIRECTIVES: <no information>  HEALTH MAINTENANCE: History  Substance Use Topics  . Smoking status: Never Smoker   . Smokeless tobacco: Never Used     Comment: never used tobaccco  . Alcohol Use: No   Colonoscopy: PAP: Bone density: Lipid panel:  No Known Allergies  Current Outpatient Prescriptions  Medication Sig Dispense Refill  . albuterol-ipratropium (COMBIVENT) 18-103 MCG/ACT inhaler Inhale 1-2 puffs into the lungs every 4 (four) hours as needed.      [name] aspirin 81 MG tablet Take 81 mg by mouth daily.      [name] desonide (DESOWEN) 0.05 % cream Apply topically as needed.     . Ergocalciferol (VITAMIN  D2) 2000 UNITS TABS Take 1 tablet by mouth daily.      [name] exenatide (BYETTA) 10 MCG/0.04ML SOLN Inject into the skin every morning.     . fluocinonide (LIDEX) 0.05 % external solution Apply topically as needed.     . furosemide (LASIX) 40 MG tablet Take 20 mg by mouth as needed.     . insulin NPH-insulin regular (NOVOLIN 70/30) (70-30) 100 UNIT/ML injection Inject into the skin daily with breakfast. 20 units in the morning And 5 units in evening    . metoprolol (LOPRESSOR) 50 MG tablet Take 50 mg by mouth daily.     . mometasone (ASMANEX 120 METERED DOSES) 220 MCG/INH inhaler Inhale 2 puffs into the lungs daily.     . mometasone (NASONEX) 50 MCG/ACT nasal spray Place 2 sprays into the nose daily. 17 g   . montelukast (SINGULAIR) 10 MG tablet Take 10 mg by mouth daily.      . Multiple Vitamins-Minerals  (PRESERVISION AREDS PO) Take 1 capsule by mouth 2 (two) times daily.      . nitroGLYCERIN (NITROSTAT) 0.4 MG SL tablet Place 0.4 mg under the tongue every 5 (five) minutes as needed.      . simvastatin (ZOCOR) 40 MG tablet Take 40 mg by mouth at bedtime.      . traMADol (ULTRAM) 50 MG tablet Take 50 mg by mouth every 6 (six) hours as needed.    . travoprost, benzalkonium, (TRAVATAN) 0.004 % ophthalmic solution Place 1 drop into both eyes daily.      Current Facility-Administered Medications  Medication Dose Route Frequency Provider Last Rate Last Dose  . 0.9 %  sodium chloride infusion   Intravenous Once Volanda Napoleon, MD 500 mL/hr at 05/05/14 1046     OBJECTIVE: Filed Vitals:   05/05/14 0955  BP: 167/79  Pulse: 79  Temp: 97.5 F (36.4 C)   There is no weight on file to calculate BMI. ECOG FS:0 - Asymptomatic Ocular: Sclerae unicteric, pupils equal, round and reactive to light Ear-nose-throat: Oropharynx clear, dentition fair Lymphatic: No cervical or supraclavicular adenopathy Lungs no rales or rhonchi, good excursion bilaterally Heart regular rate and rhythm, no murmur appreciated Abd soft, nontender, positive bowel sounds MSK no focal spinal tenderness, no joint edema Neuro: non-focal, well-oriented, appropriate affect  LAB RESULTS: CMP     Component Value Date/Time   NA 143 05/05/2014 0941   NA 144 03/04/2014 1003   NA 139 07/12/2010 0935   K 4.0 05/05/2014 0941   K 3.7 03/04/2014 1003   K 4.0 07/12/2010 0935   CL 104 03/04/2014 1003   CL 101 07/12/2010 0935   CO2 28 05/05/2014 0941   CO2 28 03/04/2014 1003   CO2 27 07/12/2010 0935   GLUCOSE 67* 05/05/2014 0941   GLUCOSE 123* 03/04/2014 1003   GLUCOSE 197* 07/12/2010 0935   BUN 20.6 05/05/2014 0941   BUN 21 03/04/2014 1003   BUN 26* 07/12/2010 0935   CREATININE 1.3 05/05/2014 0941   CREATININE 1.5* 03/04/2014 1003   CREATININE 1.29 07/12/2010 0935   CALCIUM 9.2 05/05/2014 0941   CALCIUM 8.7 03/04/2014 1003    CALCIUM 9.7 07/12/2010 0935   PROT 6.5 05/05/2014 0941   PROT 6.6 03/04/2014 1003   PROT 6.2 07/10/2009 0942   ALBUMIN 3.8 05/05/2014 0941   ALBUMIN 4.1 07/10/2009 0942   AST 17 05/05/2014 0941   AST 21 03/04/2014 1003   AST 25 07/10/2009 0942   ALT 9 05/05/2014 0941  ALT 15 03/04/2014 1003   ALT 17 07/10/2009 0942   ALKPHOS 87 05/05/2014 0941   ALKPHOS 70 03/04/2014 1003   ALKPHOS 69 07/10/2009 0942   BILITOT 0.89 05/05/2014 0941   BILITOT 0.80 03/04/2014 1003   BILITOT 0.7 07/10/2009 0942   GFRNONAA 49* 03/12/2010 1119   GFRAA * 03/12/2010 1119    59        The eGFR has been calculated using the MDRD equation. This calculation has not been validated in all clinical situations. eGFR's persistently <60 mL/min signify possible Chronic Kidney Disease.   No results found for: SPEP Lab Results  Component Value Date   WBC 5.4 05/05/2014   NEUTROABS 3.4 05/05/2014   HGB 16.1 05/05/2014   HCT 48.4 05/05/2014   MCV 88 05/05/2014   PLT 108* 05/05/2014   No results found for: LABCA2 No components found for: [id number] No results for input(s): INR in the last 168 hours. Urinalysis No results found for: COLORURINE STUDIES: No results found.  ASSESSMENT/PLAN: Mr. Nyborg is a pleasant 81 year old gentleman with polycythemia vera.  We will go ahead and phlebotomize him today.  I'll plan to get him back in another couple months.     Volanda Napoleon, MD 05/05/2014 11:08 AM

## 2014-05-05 NOTE — Progress Notes (Signed)
Donald Kaiser presents today for phlebotomy per MD orders. Phlebotomy procedure started at 1026 and ended at 1043. 500 grams removed.  .9 Ns infusing wide open after procedure Patient observed for 30 minutes after procedure without any incident. Patient tolerated procedure well.  BP 183/81. IV needle removed intact.

## 2014-05-06 ENCOUNTER — Ambulatory Visit: Payer: Medicare Other | Admitting: Hematology & Oncology

## 2014-05-06 ENCOUNTER — Other Ambulatory Visit: Payer: Medicare Other | Admitting: Lab

## 2014-07-04 ENCOUNTER — Ambulatory Visit (HOSPITAL_BASED_OUTPATIENT_CLINIC_OR_DEPARTMENT_OTHER): Payer: Medicare Other

## 2014-07-04 ENCOUNTER — Other Ambulatory Visit (HOSPITAL_BASED_OUTPATIENT_CLINIC_OR_DEPARTMENT_OTHER): Payer: Medicare Other

## 2014-07-04 ENCOUNTER — Encounter: Payer: Self-pay | Admitting: Family

## 2014-07-04 ENCOUNTER — Ambulatory Visit (HOSPITAL_BASED_OUTPATIENT_CLINIC_OR_DEPARTMENT_OTHER): Payer: Medicare Other | Admitting: Family

## 2014-07-04 VITALS — BP 151/62 | HR 65 | Temp 97.6°F | Resp 18 | Ht 66.0 in | Wt 204.0 lb

## 2014-07-04 VITALS — BP 139/58 | HR 60

## 2014-07-04 DIAGNOSIS — D45 Polycythemia vera: Secondary | ICD-10-CM

## 2014-07-04 LAB — CBC WITH DIFFERENTIAL (CANCER CENTER ONLY)
BASO#: 0 10*3/uL (ref 0.0–0.2)
BASO%: 0.1 % (ref 0.0–2.0)
EOS%: 2.1 % (ref 0.0–7.0)
Eosinophils Absolute: 0.1 10*3/uL (ref 0.0–0.5)
HCT: 46.7 % (ref 38.7–49.9)
HGB: 15.5 g/dL (ref 13.0–17.1)
LYMPH#: 1.5 10*3/uL (ref 0.9–3.3)
LYMPH%: 21.9 % (ref 14.0–48.0)
MCH: 29.5 pg (ref 28.0–33.4)
MCHC: 33.2 g/dL (ref 32.0–35.9)
MCV: 89 fL (ref 82–98)
MONO#: 0.7 10*3/uL (ref 0.1–0.9)
MONO%: 10 % (ref 0.0–13.0)
NEUT%: 65.9 % (ref 40.0–80.0)
NEUTROS ABS: 4.5 10*3/uL (ref 1.5–6.5)
PLATELETS: 92 10*3/uL — AB (ref 145–400)
RBC: 5.25 10*6/uL (ref 4.20–5.70)
RDW: 14.1 % (ref 11.1–15.7)
WBC: 6.8 10*3/uL (ref 4.0–10.0)

## 2014-07-04 MED ORDER — SODIUM CHLORIDE 0.9 % IV SOLN
Freq: Once | INTRAVENOUS | Status: AC
  Administered 2014-07-04: 11:00:00 via INTRAVENOUS

## 2014-07-04 NOTE — Progress Notes (Signed)
Donald Kaiser presents today for phlebotomy per MD orders. Phlebotomy procedure started at 1055 and ended at 1115. 500 ml removed. Patient observed for 30 minutes after procedure without any incident. Patient tolerated procedure well. IV needle removed intact.

## 2014-07-04 NOTE — Patient Instructions (Signed)

## 2014-07-04 NOTE — Progress Notes (Signed)
Hematology and Oncology Follow Up Visit  Donald Kaiser [id number] May 30, 1926 81 y.o. 07/04/2014   Principle Diagnosis:  Polycythemia vera  Current Therapy:   Phlebotomy to maintain hematocrit low 45%  Aspirin 81 mg by mouth daily    Interim History:  Donald Kaiser is is here today for a follow-up. His complexion is quite "ruddy" today. His Hct is 46.7 today. He was last phlebotomized in January.  He and his wife stayed home and had Easter together. They had a good time. Their daughter brought them lots of good food to eat. He denies fever, chills, rash, dizziness, headache, chest pain, palpitations, abdominal pain, constipation, diarrhea, blood in urine or stool. He has some SOB when he exerts himself. He is using a cane to get around and has had no falls.  No tenderness, numbness or tingling in his extremities. He has some chronic leg swelling. This remains the same. No new aches or pains.  His appetite is good and he is staying hydrated.   Medications:    Medication List       This list is accurate as of: 07/04/14 10:23 AM.  Always use your most recent med list.               ASMANEX 120 METERED DOSES 220 MCG/INH inhaler  Generic drug:  mometasone  Inhale 2 puffs into the lungs daily.     aspirin 81 MG tablet  Take 81 mg by mouth daily.     COMBIVENT 18-103 MCG/ACT inhaler  Generic drug:  albuterol-ipratropium  Inhale 1-2 puffs into the lungs every 4 (four) hours as needed.     desonide 0.05 % cream  Commonly known as:  DESOWEN  Apply topically as needed.     exenatide 10 MCG/0.04ML Sopn injection  Commonly known as:  BYETTA  Inject into the skin every morning.     fluocinonide 0.05 % external solution  Commonly known as:  LIDEX  Apply topically as needed.     furosemide 40 MG tablet  Commonly known as:  LASIX  Take 20 mg by mouth as needed.     insulin NPH-regular Human (70-30) 100 UNIT/ML injection  Commonly known as:  NOVOLIN 70/30  Inject into the  skin daily with breakfast. 20 units in the morning And 5 units in evening     metoprolol 50 MG tablet  Commonly known as:  LOPRESSOR  Take 50 mg by mouth daily.     mometasone 50 MCG/ACT nasal spray  Commonly known as:  NASONEX  Place 2 sprays into the nose daily.     montelukast 10 MG tablet  Commonly known as:  SINGULAIR  Take 10 mg by mouth daily.     nitroGLYCERIN 0.4 MG SL tablet  Commonly known as:  NITROSTAT  Place 0.4 mg under the tongue every 5 (five) minutes as needed.     PRESERVISION AREDS PO  Take 1 capsule by mouth 2 (two) times daily.     simvastatin 40 MG tablet  Commonly known as:  ZOCOR  Take 40 mg by mouth at bedtime.     traMADol 50 MG tablet  Commonly known as:  ULTRAM  Take 50 mg by mouth every 6 (six) hours as needed.     travoprost (benzalkonium) 0.004 % ophthalmic solution  Commonly known as:  TRAVATAN  Place 1 drop into both eyes daily.     Vitamin D2 2000 UNITS Tabs  Take 1 tablet by mouth daily.  Allergies: No Known Allergies  Past Medical History, Surgical history, Social history, and Family History were reviewed and updated.  Review of Systems: All other 10 point review of systems is negative.   Physical Exam:  height is 5\' 6"  (1.676 m) and weight is 204 lb (92.534 kg). His oral temperature is 97.6 F (36.4 C). His blood pressure is 151/62 and his pulse is 65. His respiration is 18.   Wt Readings from Last 3 Encounters:  07/04/14 204 lb (92.534 kg)  03/04/14 210 lb (95.255 kg)  12/22/13 213 lb (96.616 kg)    Ocular: Sclerae unicteric, pupils equal, round and reactive to light Ear-nose-throat: Oropharynx clear, dentition fair Lymphatic: No cervical or supraclavicular adenopathy Lungs no rales or rhonchi, good excursion bilaterally Heart regular rate and rhythm, no murmur appreciated Abd soft, nontender, positive bowel sounds MSK no focal spinal tenderness, no joint edema Neuro: non-focal, well-oriented, appropriate  affect  Lab Results  Component Value Date   WBC 6.8 07/04/2014   HGB 15.5 07/04/2014   HCT 46.7 07/04/2014   MCV 89 07/04/2014   PLT 92* 07/04/2014   Lab Results  Component Value Date   FERRITIN 44 05/05/2014   IRON 71 05/05/2014   TIBC 258 05/05/2014   UIBC 187 05/05/2014   IRONPCTSAT 28 05/05/2014   Lab Results  Component Value Date   RETICCTPCT 0.8 11/12/2007   RBC 5.25 07/04/2014   RETICCTABS 43.1 11/12/2007   No results found for: KPAFRELGTCHN, LAMBDASER, KAPLAMBRATIO No results found for: IGGSERUM, IGA, IGMSERUM No results found for: Kathrynn Ducking, MSPIKE, SPEI   Chemistry      Component Value Date/Time   NA 143 05/05/2014 0941   NA 144 03/04/2014 1003   NA 139 07/12/2010 0935   K 4.0 05/05/2014 0941   K 3.7 03/04/2014 1003   K 4.0 07/12/2010 0935   CL 104 03/04/2014 1003   CL 101 07/12/2010 0935   CO2 28 05/05/2014 0941   CO2 28 03/04/2014 1003   CO2 27 07/12/2010 0935   BUN 20.6 05/05/2014 0941   BUN 21 03/04/2014 1003   BUN 26* 07/12/2010 0935   CREATININE 1.3 05/05/2014 0941   CREATININE 1.5* 03/04/2014 1003   CREATININE 1.29 07/12/2010 0935      Component Value Date/Time   CALCIUM 9.2 05/05/2014 0941   CALCIUM 8.7 03/04/2014 1003   CALCIUM 9.7 07/12/2010 0935   ALKPHOS 87 05/05/2014 0941   ALKPHOS 70 03/04/2014 1003   ALKPHOS 69 07/10/2009 0942   AST 17 05/05/2014 0941   AST 21 03/04/2014 1003   AST 25 07/10/2009 0942   ALT 9 05/05/2014 0941   ALT 15 03/04/2014 1003   ALT 17 07/10/2009 0942   BILITOT 0.89 05/05/2014 0941   BILITOT 0.80 03/04/2014 1003   BILITOT 0.7 07/10/2009 0942     Impression and Plan: Donald Kaiser is a very pleasant 81 year old gentleman with polycythemia vera. He is doing well but is beginning to have some SOB with exertion.  His Hct today is 46.7. We will go ahead and phlebotomize him.  We will see him back in 3 months for labs and follow-up.  He knows to call here with  any questions or concerns. We can certainly see him sooner is need be.   Eliezer Bottom, NP 3/28/201610:23 AM

## 2014-10-03 ENCOUNTER — Other Ambulatory Visit (HOSPITAL_BASED_OUTPATIENT_CLINIC_OR_DEPARTMENT_OTHER): Payer: Medicare Other

## 2014-10-03 ENCOUNTER — Ambulatory Visit (HOSPITAL_BASED_OUTPATIENT_CLINIC_OR_DEPARTMENT_OTHER): Payer: Medicare Other

## 2014-10-03 ENCOUNTER — Ambulatory Visit (HOSPITAL_BASED_OUTPATIENT_CLINIC_OR_DEPARTMENT_OTHER): Payer: Medicare Other | Admitting: Family

## 2014-10-03 VITALS — BP 176/78 | HR 53 | Temp 97.6°F | Resp 20 | Wt 201.0 lb

## 2014-10-03 VITALS — BP 148/75 | HR 53

## 2014-10-03 DIAGNOSIS — D45 Polycythemia vera: Secondary | ICD-10-CM

## 2014-10-03 LAB — CBC WITH DIFFERENTIAL (CANCER CENTER ONLY)
BASO#: 0 10*3/uL (ref 0.0–0.2)
BASO%: 0.1 % (ref 0.0–2.0)
EOS ABS: 0.2 10*3/uL (ref 0.0–0.5)
EOS%: 2.1 % (ref 0.0–7.0)
HCT: 45.4 % (ref 38.7–49.9)
HGB: 15.3 g/dL (ref 13.0–17.1)
LYMPH#: 1.4 10*3/uL (ref 0.9–3.3)
LYMPH%: 19.2 % (ref 14.0–48.0)
MCH: 29.3 pg (ref 28.0–33.4)
MCHC: 33.7 g/dL (ref 32.0–35.9)
MCV: 87 fL (ref 82–98)
MONO#: 0.6 10*3/uL (ref 0.1–0.9)
MONO%: 8.6 % (ref 0.0–13.0)
NEUT%: 70 % (ref 40.0–80.0)
NEUTROS ABS: 5 10*3/uL (ref 1.5–6.5)
PLATELETS: 108 10*3/uL — AB (ref 145–400)
RBC: 5.22 10*6/uL (ref 4.20–5.70)
RDW: 13.8 % (ref 11.1–15.7)
WBC: 7.2 10*3/uL (ref 4.0–10.0)

## 2014-10-03 LAB — CMP (CANCER CENTER ONLY)
ALT(SGPT): 19 U/L (ref 10–47)
AST: 16 U/L (ref 11–38)
Albumin: 3.6 g/dL (ref 3.3–5.5)
Alkaline Phosphatase: 89 U/L — ABNORMAL HIGH (ref 26–84)
BILIRUBIN TOTAL: 0.9 mg/dL (ref 0.20–1.60)
BUN, Bld: 23 mg/dL — ABNORMAL HIGH (ref 7–22)
CO2: 30 mEq/L (ref 18–33)
CREATININE: 1.6 mg/dL — AB (ref 0.6–1.2)
Calcium: 8.8 mg/dL (ref 8.0–10.3)
Chloride: 95 mEq/L — ABNORMAL LOW (ref 98–108)
Glucose, Bld: 337 mg/dL — ABNORMAL HIGH (ref 73–118)
Potassium: 4.7 mEq/L (ref 3.3–4.7)
Sodium: 137 mEq/L (ref 128–145)
Total Protein: 6.3 g/dL — ABNORMAL LOW (ref 6.4–8.1)

## 2014-10-03 LAB — IRON AND TIBC CHCC
%SAT: 23 % (ref 20–55)
IRON: 62 ug/dL (ref 42–163)
TIBC: 263 ug/dL (ref 202–409)
UIBC: 201 ug/dL (ref 117–376)

## 2014-10-03 LAB — FERRITIN CHCC: Ferritin: 28 ng/ml (ref 22–316)

## 2014-10-03 MED ORDER — SODIUM CHLORIDE 0.9 % IV SOLN
INTRAVENOUS | Status: DC
  Administered 2014-10-03: 11:00:00 via INTRAVENOUS

## 2014-10-03 NOTE — Patient Instructions (Signed)
Fall Prevention and Home Safety °Falls cause injuries and can affect all age groups. It is possible to prevent falls.  °HOW TO PREVENT FALLS °· Wear shoes with rubber soles that do not have an opening for your toes. °· Keep the inside and outside of your house well lit. °· Use night lights throughout your home. °· Remove clutter from floors. °· Clean up floor spills. °· Remove throw rugs or fasten them to the floor with carpet tape. °· Do not place electrical cords across pathways. °· Put grab bars by your tub, shower, and toilet. Do not use towel bars as grab bars. °· Put handrails on both sides of the stairway. Fix loose handrails. °· Do not climb on stools or stepladders, if possible. °· Do not wax your floors. °· Repair uneven or unsafe sidewalks, walkways, or stairs. °· Keep items you use a lot within reach. °· Be aware of pets. °· Keep emergency numbers next to the telephone. °· Put smoke detectors in your home and near bedrooms. °Ask your doctor what other things you can do to prevent falls. °Document Released: 01/19/2009 Document Revised: 09/24/2011 Document Reviewed: 06/25/2011 °Exit[employer]® Patient Information ©2015 [employer]. This information is not intended to replace advice given to you by your health care provider. Make sure you discuss any questions you have with your health care provider. ° °

## 2014-10-03 NOTE — Patient Instructions (Signed)

## 2014-10-03 NOTE — Progress Notes (Signed)
Hematology and Oncology Follow Up Visit  Donald Kaiser [id number] 17-Sep-1926 81 y.o. 10/03/2014   Principle Diagnosis:  Polycythemia vera  Current Therapy:   Phlebotomy to maintain hematocrit low 45%  Aspirin 81 mg by mouth daily    Interim History:  Donald Kaiser is is here today for a follow-up. He is feeling tired today. His last phlebotomy was in March. He does better with a "slow phlebotomy" and then 1/2 liter of fluids after. This prevent him from becoming dizzy and hypotensive. He is hypertensive today so hopefully this will help.  Unfortunately, his wife fell and fractured her hip and is in a SNF for rehab. His daughter is with him today.  He denies fever, chills, rash, dizziness, headache, chest pain, palpitations, abdominal pain, constipation, diarrhea, blood in urine or stool. He does have SOB with exertion.  His daughter mentions that he is becoming a little forgetful. They plan to discuss this with his PCP in August.  He is using a cane to get around and has had no falls recently.  No tenderness, numbness or tingling in his extremities. He has some chronic leg swelling that comes and goes. No new aches or pains.  His is eating well and staying hydrated. His weight is stable.   Medications:    Medication List       This list is accurate as of: 10/03/14 10:57 AM.  Always use your most recent med list.               ASMANEX 120 METERED DOSES 220 MCG/INH inhaler  Generic drug:  mometasone  Inhale 2 puffs into the lungs daily.     aspirin 81 MG tablet  Take 81 mg by mouth daily.     COMBIVENT 18-103 MCG/ACT inhaler  Generic drug:  albuterol-ipratropium  Inhale 1-2 puffs into the lungs every 4 (four) hours as needed.     desonide 0.05 % cream  Commonly known as:  DESOWEN  Apply topically as needed.     exenatide 10 MCG/0.04ML Sopn injection  Commonly known as:  BYETTA  Inject into the skin every morning.     fluocinonide 0.05 % external solution  Commonly  known as:  LIDEX  Apply topically as needed.     furosemide 40 MG tablet  Commonly known as:  LASIX  Take 20 mg by mouth as needed.     insulin NPH-regular Human (70-30) 100 UNIT/ML injection  Commonly known as:  NOVOLIN 70/30  Inject into the skin daily with breakfast. 20 units in the morning And 5 units in evening     metoprolol 50 MG tablet  Commonly known as:  LOPRESSOR  Take 50 mg by mouth daily.     mometasone 50 MCG/ACT nasal spray  Commonly known as:  NASONEX  Place 2 sprays into the nose daily.     montelukast 10 MG tablet  Commonly known as:  SINGULAIR  Take 10 mg by mouth daily.     nitroGLYCERIN 0.4 MG SL tablet  Commonly known as:  NITROSTAT  Place 0.4 mg under the tongue every 5 (five) minutes as needed.     PRESERVISION AREDS PO  Take 1 capsule by mouth 2 (two) times daily.     simvastatin 40 MG tablet  Commonly known as:  ZOCOR  Take 40 mg by mouth at bedtime.     traMADol 50 MG tablet  Commonly known as:  ULTRAM  Take 50 mg by mouth every 6 (six) hours as  needed.     travoprost (benzalkonium) 0.004 % ophthalmic solution  Commonly known as:  TRAVATAN  Place 1 drop into both eyes daily.     Vitamin D2 2000 UNITS Tabs  Take 1 tablet by mouth daily.        Allergies: No Known Allergies  Past Medical History, Surgical history, Social history, and Family History were reviewed and updated.  Review of Systems: All other 10 point review of systems is negative.   Physical Exam:  weight is 201 lb (91.173 kg). His oral temperature is 97.6 F (36.4 C). His blood pressure is 176/78 and his pulse is 53. His respiration is 20.   Wt Readings from Last 3 Encounters:  10/03/14 201 lb (91.173 kg)  07/04/14 204 lb (92.534 kg)  03/04/14 210 lb (95.255 kg)    Ocular: Sclerae unicteric, pupils equal, round and reactive to light Ear-nose-throat: Oropharynx clear, dentition fair Lymphatic: No cervical or supraclavicular adenopathy Lungs no rales or rhonchi,  good excursion bilaterally Heart regular rate and rhythm, no murmur appreciated Abd soft, nontender, positive bowel sounds MSK no focal spinal tenderness, no joint edema Neuro: non-focal, well-oriented, appropriate affect  Lab Results  Component Value Date   WBC 7.2 10/03/2014   HGB 15.3 10/03/2014   HCT 45.4 10/03/2014   MCV 87 10/03/2014   PLT 108* 10/03/2014   Lab Results  Component Value Date   FERRITIN 44 05/05/2014   IRON 71 05/05/2014   TIBC 258 05/05/2014   UIBC 187 05/05/2014   IRONPCTSAT 28 05/05/2014   Lab Results  Component Value Date   RETICCTPCT 0.8 11/12/2007   RBC 5.22 10/03/2014   RETICCTABS 43.1 11/12/2007   No results found for: KPAFRELGTCHN, LAMBDASER, KAPLAMBRATIO No results found for: IGGSERUM, IGA, IGMSERUM No results found for: Kathrynn Ducking, MSPIKE, SPEI   Chemistry      Component Value Date/Time   NA 143 05/05/2014 0941   NA 144 03/04/2014 1003   NA 139 07/12/2010 0935   K 4.0 05/05/2014 0941   K 3.7 03/04/2014 1003   K 4.0 07/12/2010 0935   CL 104 03/04/2014 1003   CL 101 07/12/2010 0935   CO2 28 05/05/2014 0941   CO2 28 03/04/2014 1003   CO2 27 07/12/2010 0935   BUN 20.6 05/05/2014 0941   BUN 21 03/04/2014 1003   BUN 26* 07/12/2010 0935   CREATININE 1.3 05/05/2014 0941   CREATININE 1.5* 03/04/2014 1003   CREATININE 1.29 07/12/2010 0935      Component Value Date/Time   CALCIUM 9.2 05/05/2014 0941   CALCIUM 8.7 03/04/2014 1003   CALCIUM 9.7 07/12/2010 0935   ALKPHOS 87 05/05/2014 0941   ALKPHOS 70 03/04/2014 1003   ALKPHOS 69 07/10/2009 0942   AST 17 05/05/2014 0941   AST 21 03/04/2014 1003   AST 25 07/10/2009 0942   ALT 9 05/05/2014 0941   ALT 15 03/04/2014 1003   ALT 17 07/10/2009 0942   BILITOT 0.89 05/05/2014 0941   BILITOT 0.80 03/04/2014 1003   BILITOT 0.7 07/10/2009 0942     Impression and Plan: Donald Kaiser is a very pleasant 81 year old gentleman with polycythemia  vera. He is feeling tired today and does become SOB with any exertion.  His Hct today is 45.4. We will do a "slow" phlebotomy on him today and also give him fluids after.  We will see him back in 3 months for labs and follow-up.  He knows to call here with any  questions or concerns. We can certainly see him sooner is need be.   Eliezer Bottom, NP 6/27/201610:57 AM

## 2014-10-03 NOTE — Progress Notes (Signed)
Donald Kaiser presents today for phlebotomy per MD orders. Phlebotomy procedure started at 1105 and ended at 1120. 500 ml removed. Patient observed for 30 minutes after procedure without any incident. Patient tolerated procedure well. IV needle removed intact.

## 2015-01-03 ENCOUNTER — Ambulatory Visit (HOSPITAL_BASED_OUTPATIENT_CLINIC_OR_DEPARTMENT_OTHER): Payer: Medicare Other | Admitting: Hematology & Oncology

## 2015-01-03 ENCOUNTER — Other Ambulatory Visit (HOSPITAL_BASED_OUTPATIENT_CLINIC_OR_DEPARTMENT_OTHER): Payer: Medicare Other

## 2015-01-03 ENCOUNTER — Encounter: Payer: Self-pay | Admitting: Hematology & Oncology

## 2015-01-03 VITALS — BP 121/45 | HR 53 | Temp 97.4°F | Resp 14 | Ht 66.0 in

## 2015-01-03 DIAGNOSIS — D45 Polycythemia vera: Secondary | ICD-10-CM

## 2015-01-03 LAB — CBC WITH DIFFERENTIAL (CANCER CENTER ONLY)
BASO#: 0 10*3/uL (ref 0.0–0.2)
BASO%: 0.2 % (ref 0.0–2.0)
EOS%: 1.6 % (ref 0.0–7.0)
Eosinophils Absolute: 0.1 10*3/uL (ref 0.0–0.5)
HCT: 40.6 % (ref 38.7–49.9)
HEMOGLOBIN: 13.6 g/dL (ref 13.0–17.1)
LYMPH#: 1.1 10*3/uL (ref 0.9–3.3)
LYMPH%: 19.5 % (ref 14.0–48.0)
MCH: 28.8 pg (ref 28.0–33.4)
MCHC: 33.5 g/dL (ref 32.0–35.9)
MCV: 86 fL (ref 82–98)
MONO#: 0.5 10*3/uL (ref 0.1–0.9)
MONO%: 9.4 % (ref 0.0–13.0)
NEUT%: 69.3 % (ref 40.0–80.0)
NEUTROS ABS: 4 10*3/uL (ref 1.5–6.5)
Platelets: 101 10*3/uL — ABNORMAL LOW (ref 145–400)
RBC: 4.72 10*6/uL (ref 4.20–5.70)
RDW: 13.8 % (ref 11.1–15.7)
WBC: 5.7 10*3/uL (ref 4.0–10.0)

## 2015-01-03 LAB — COMPREHENSIVE METABOLIC PANEL
ALK PHOS: 72 U/L (ref 40–115)
ALT: 17 U/L (ref 9–46)
AST: 16 U/L (ref 10–35)
Albumin: 3.4 g/dL — ABNORMAL LOW (ref 3.6–5.1)
BUN: 26 mg/dL — ABNORMAL HIGH (ref 7–25)
CHLORIDE: 103 mmol/L (ref 98–110)
CO2: 25 mmol/L (ref 20–31)
Calcium: 8.3 mg/dL — ABNORMAL LOW (ref 8.6–10.3)
Creatinine, Ser: 1.32 mg/dL — ABNORMAL HIGH (ref 0.70–1.11)
Glucose, Bld: 322 mg/dL — ABNORMAL HIGH (ref 65–99)
POTASSIUM: 4.2 mmol/L (ref 3.5–5.3)
Sodium: 137 mmol/L (ref 135–146)
TOTAL PROTEIN: 5.4 g/dL — AB (ref 6.1–8.1)
Total Bilirubin: 0.6 mg/dL (ref 0.2–1.2)

## 2015-01-03 LAB — IRON AND TIBC CHCC
%SAT: 34 % (ref 20–55)
Iron: 77 ug/dL (ref 42–163)
TIBC: 226 ug/dL (ref 202–409)
UIBC: 150 ug/dL (ref 117–376)

## 2015-01-03 LAB — FERRITIN CHCC: FERRITIN: 79 ng/mL (ref 22–316)

## 2015-01-03 NOTE — Progress Notes (Signed)
Donald Kaiser  [phone] [phone]) [phone]  ID: [name] OB: January 06, 1927 MR#: [id number] EXH#:[id number] Patient Care Team: Shon Baton, MD as PCP - General Elsie Stain, MD (Pulmonary Disease)  DIAGNOSIS: Polycythemia vera  INTERVAL HISTORY: [name] is back for follow-up. He is wife are now in a nursing home. He has lost quite a bit overweight. I just think that his body is slowing down.  He still has the diabetes. He still has back problems.  He's had no bleeding. He's had no fever. He's had no cough.  He's had some medication changes. CURRENT TREATMENT: Phlebotomy to maintain hematocrit low 45%  Aspirin 81 mg by mouth daily  REVIEW OF SYSTEMS: All other 10 point review of systems negative except for those issues mentioned above.  PAST MEDICAL HISTORY: Past Medical History  Diagnosis Date  . OSA (obstructive sleep apnea)   . GERD (gastroesophageal reflux disease)   . Diabetes mellitus type II   . Asthma   . DVT (deep venous thrombosis)     chronic  . Post-phlebitic syndrome   . Polycythemia vera(238.4)     Ennever  . COPD (chronic obstructive pulmonary disease)   . Hypertension    PAST SURGICAL HISTORY: Past Surgical History  Procedure Laterality Date  . Tonsillectomy    . Artrial bypass      x 5  . Back surgery    . Broken leg surgery     FAMILY HISTORY Family History  Problem Relation Age of Onset  . Emphysema Sister   . Allergies Sister   . Asthma Sister   . Heart disease Brother   . Heart disease Brother   . Rheum arthritis Sister   . Rheum arthritis Mother   . Cancer Mother   . Cancer Sister   . Kidney failure Father   . Stroke Sister    SOCIAL HISTORY:  Social History   Social History  . Marital Status: Married    Spouse Name: N/A  . Number of Children: N/A  . Years of Education: N/A   Occupational History  . Retired     Company secretary   Social History Main Topics  . Smoking status: Never Smoker   .  Smokeless tobacco: Never Used     Comment: never used tobaccco  . Alcohol Use: No  . Drug Use: No  . Sexual Activity: Not on file   Other Topics Concern  . Not on file   Social History Narrative   ADVANCED DIRECTIVES: <no information>  HEALTH MAINTENANCE: Social History  Substance Use Topics  . Smoking status: Never Smoker   . Smokeless tobacco: Never Used     Comment: never used tobaccco  . Alcohol Use: No   Colonoscopy: PAP: Bone density: Lipid panel:  No Known Allergies  Current Outpatient Prescriptions  Medication Sig Dispense Refill  . albuterol-ipratropium (COMBIVENT) 18-103 MCG/ACT inhaler Inhale 1-2 puffs into the lungs every 4 (four) hours as needed.      [name] aspirin 81 MG tablet Take 81 mg by mouth daily.      [name] desonide (DESOWEN) 0.05 % cream Apply topically as needed.     . Ergocalciferol (VITAMIN D2) 2000 UNITS TABS Take 1 tablet by mouth daily.      [name] exenatide (BYETTA) 10 MCG/0.04ML SOLN Inject into the skin every morning.     . fluocinonide (LIDEX) 0.05 % external solution Apply topically as needed.     . furosemide (LASIX) 40 MG tablet Take 20  mg by mouth as needed.     . haloperidol (HALDOL) 1 MG tablet TAKE TWO TABLET BY MOUTH AT BEDTIME FOR ONE WEEK  1  . insulin NPH-insulin regular (NOVOLIN 70/30) (70-30) 100 UNIT/ML injection Inject into the skin daily with breakfast. 20 units in the morning And 5 units in evening    . memantine (NAMENDA) 5 MG tablet Take 5 mg by mouth at bedtime.  3  . metoprolol (LOPRESSOR) 50 MG tablet Take 50 mg by mouth daily.     . mometasone (ASMANEX 120 METERED DOSES) 220 MCG/INH inhaler Inhale 2 puffs into the lungs daily.     . mometasone (NASONEX) 50 MCG/ACT nasal spray Place 2 sprays into the nose daily. 17 g   . montelukast (SINGULAIR) 10 MG tablet Take 10 mg by mouth daily.      . Multiple Vitamins-Minerals (PRESERVISION AREDS PO) Take 1 capsule by mouth 2 (two) times daily.      . nitroGLYCERIN (NITROSTAT) 0.4 MG SL  tablet Place 0.4 mg under the tongue every 5 (five) minutes as needed.      . simvastatin (ZOCOR) 40 MG tablet Take 40 mg by mouth at bedtime.      . tamsulosin (FLOMAX) 0.4 MG CAPS capsule TAKE 1 CAPSULE BY MOUTH EVERY DAY AFTER DINNER  5  . traMADol (ULTRAM) 50 MG tablet Take 50 mg by mouth every 6 (six) hours as needed.    . travoprost, benzalkonium, (TRAVATAN) 0.004 % ophthalmic solution Place 1 drop into both eyes daily.     . traZODone (DESYREL) 50 MG tablet TAKE 1 TABLET BY MOUTH EVERY DAY AT BEDTIME FOR SLEEP  5   No current facility-administered medications for this visit.   OBJECTIVE: Filed Vitals:   01/03/15 1045  BP: 121/45  Pulse: 53  Temp: 97.4 F (36.3 C)  Resp: 14   There is no weight on file to calculate BMI. ECOG PS 3 Ocular: Sclerae unicteric, pupils equal, round and reactive to light Ear-nose-throat: Oropharynx clear, dentition fair Lymphatic: No cervical or supraclavicular adenopathy Lungs no rales or rhonchi, good excursion bilaterally Heart regular rate and rhythm, no murmur appreciated Abd soft, nontender, positive bowel sounds MSK no focal spinal tenderness, no joint edema Neuro: non-focal, well-oriented, appropriate affect  LAB RESULTS: CMP     Component Value Date/Time   NA 137 10/03/2014 0954   NA 143 05/05/2014 0941   NA 139 07/12/2010 0935   K 4.7 10/03/2014 0954   K 4.0 05/05/2014 0941   K 4.0 07/12/2010 0935   CL 95* 10/03/2014 0954   CL 101 07/12/2010 0935   CO2 30 10/03/2014 0954   CO2 28 05/05/2014 0941   CO2 27 07/12/2010 0935   GLUCOSE 337* 10/03/2014 0954   GLUCOSE 67* 05/05/2014 0941   GLUCOSE 197* 07/12/2010 0935   BUN 23* 10/03/2014 0954   BUN 20.6 05/05/2014 0941   BUN 26* 07/12/2010 0935   CREATININE 1.6* 10/03/2014 0954   CREATININE 1.3 05/05/2014 0941   CREATININE 1.29 07/12/2010 0935   CALCIUM 8.8 10/03/2014 0954   CALCIUM 9.2 05/05/2014 0941   CALCIUM 9.7 07/12/2010 0935   PROT 6.3* 10/03/2014 0954   PROT 6.5  05/05/2014 0941   PROT 6.2 07/10/2009 0942   ALBUMIN 3.8 05/05/2014 0941   ALBUMIN 4.1 07/10/2009 0942   AST 16 10/03/2014 0954   AST 17 05/05/2014 0941   AST 25 07/10/2009 0942   ALT 19 10/03/2014 0954   ALT 9 05/05/2014 0941  ALT 17 07/10/2009 0942   ALKPHOS 89* 10/03/2014 0954   ALKPHOS 87 05/05/2014 0941   ALKPHOS 69 07/10/2009 0942   BILITOT 0.90 10/03/2014 0954   BILITOT 0.89 05/05/2014 0941   BILITOT 0.7 07/10/2009 0942   GFRNONAA 49* 03/12/2010 1119   GFRAA * 03/12/2010 1119    59        The eGFR has been calculated using the MDRD equation. This calculation has not been validated in all clinical situations. eGFR's persistently <60 mL/min signify possible Chronic Kidney Disease.   No results found for: SPEP Lab Results  Component Value Date   WBC 5.7 01/03/2015   NEUTROABS 4.0 01/03/2015   HGB 13.6 01/03/2015   HCT 40.6 01/03/2015   MCV 86 01/03/2015   PLT 101* 01/03/2015   No results found for: LABCA2 No components found for: [id number] No results for input(s): INR in the last 168 hours. Urinalysis No results found for: COLORURINE STUDIES: No results found.  ASSESSMENT/PLAN: Mr. Roussel is a pleasant 81 year old white male.  At this point time, I really think all this is well quality-of-life.  I think we by get him back now in 6 months. It is more and more difficult to get him back to the office.      Volanda Napoleon, MD 01/03/2015 11:25 AM

## 2015-01-18 ENCOUNTER — Encounter: Payer: Self-pay | Admitting: Neurology

## 2015-01-18 ENCOUNTER — Ambulatory Visit (INDEPENDENT_AMBULATORY_CARE_PROVIDER_SITE_OTHER): Payer: Medicare Other | Admitting: Neurology

## 2015-01-18 VITALS — BP 128/78 | HR 68

## 2015-01-18 DIAGNOSIS — N3941 Urge incontinence: Secondary | ICD-10-CM

## 2015-01-18 DIAGNOSIS — M5441 Lumbago with sciatica, right side: Secondary | ICD-10-CM

## 2015-01-18 DIAGNOSIS — R413 Other amnesia: Secondary | ICD-10-CM | POA: Diagnosis not present

## 2015-01-18 DIAGNOSIS — R269 Unspecified abnormalities of gait and mobility: Secondary | ICD-10-CM

## 2015-01-18 MED ORDER — QUETIAPINE FUMARATE 25 MG PO TABS: ORAL_TABLET | ORAL | Status: DC

## 2015-01-18 MED ORDER — SOLIFENACIN SUCCINATE 5 MG PO TABS: 5.0000 mg | ORAL_TABLET | Freq: Every day | ORAL | Status: DC

## 2015-01-18 NOTE — Progress Notes (Signed)
PATIENT: Donald Kaiser DOB: 04-06-1927  Chief Complaint  Patient presents with  . Memory Loss    MMSE 21/27 (unable to complete last three - legally blind) - 6 animals.  He is here with his daughter, Donald Kaiser, and son-in-law, Donald Kaiser.  His memory has been declining, he occasionally has hallucinations( sometimes he thinks he is the [place]), his gait is unsteady and he has tremors in his bilateral hands.  He struggles with not being able to speak the words in his mind and says this is very frustrating.     HISTORICAL  Donald Kaiser is 81 year old right-handed male, company by her daughter Donald Kaiser, and her son-in-law Donald Kaiser, seen in refer by his primary care physician Dr. Shon Baton for evaluation of memory loss, visual hallucinations, gait difficulty, urinary incontinence.  He had a history of hypertension, diabetes, hyperlipidemia, coronary artery disease, status post CABG surgery, history of lumbar decompression surgery at age 53, chronic low back pain, radiating pain to right lower extremity  He had gradual bilateral visual loss since 2009 due to cataract, glaucoma, macular degenerations, diabetic retinopathy, now he can only he could only see the shadows of person, but could not tell details.  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  He complains of 3 out of 10 constant low back pain, radiating pain to right lower extremity, getting worse per her weight.  He  was started on Haldol 1 mg 2 tablets every night, did help his sleep, but seems does not help his hallucinations, before Haldol, he sometimes acting out of his dreams, fell off bed few times  I have personally reviewed CAT scan of the brain in Jan 11 2015: Bifrontal temporal predominant atrophy, periventricular small vessel disease.   REVIEW OF SYSTEMS: Full 14 system review of systems performed and notable only for loss of vision, incontinence, tremor, hallucinations, slurred speech, difficulty swallowing, confusion, memory loss, insomnia,  ALLERGIES: No Known Allergies  HOME MEDICATIONS: Current Outpatient Prescriptions  Medication Sig Dispense Refill  . albuterol-ipratropium (COMBIVENT) 18-103 MCG/ACT inhaler Inhale 1-2 puffs into the lungs every 4 (four) hours as needed.      [name] aspirin 81 MG tablet Take 81 mg by mouth daily.      [name] desonide (DESOWEN) 0.05 % cream Apply topically as needed.     . Ergocalciferol (VITAMIN D2) 2000 UNITS TABS Take 1 tablet by mouth daily.      . fluocinonide (LIDEX) 0.05 % external solution Apply topically as needed.     . furosemide (LASIX) 40 MG tablet Take 20 mg by mouth as needed.     . haloperidol (HALDOL) 1 MG tablet TAKE TWO TABLET BY MOUTH AT BEDTIME FOR ONE WEEK  1  . insulin NPH-insulin regular (NOVOLIN 70/30) (70-30) 100 UNIT/ML injection Inject into the skin daily with breakfast. 20 units in the morning And 5 units in evening    . memantine (NAMENDA) 5 MG tablet Take 5 mg by mouth at bedtime.  3  .  metoprolol (LOPRESSOR) 50 MG tablet Take 50 mg by mouth daily.     . mometasone (ASMANEX 120 METERED DOSES) 220 MCG/INH inhaler Inhale 2 puffs into the lungs daily.     . mometasone (NASONEX) 50 MCG/ACT nasal spray Place 2 sprays into the nose daily. 17 g   . montelukast (SINGULAIR) 10 MG tablet Take 10 mg by mouth daily.      . Multiple Vitamins-Minerals (PRESERVISION AREDS PO) Take 1 capsule by mouth 2 (two) times daily.      . nitroGLYCERIN  (NITROSTAT) 0.4 MG SL tablet Place 0.4 mg under the tongue every 5 (five) minutes as needed.      . simvastatin (ZOCOR) 40 MG tablet Take 40 mg by mouth at bedtime.      . tamsulosin (FLOMAX) 0.4 MG CAPS capsule TAKE 1 CAPSULE BY MOUTH EVERY DAY AFTER DINNER  5  . traMADol (ULTRAM) 50 MG tablet Take 50 mg by mouth every 6 (six) hours as needed.    . travoprost, benzalkonium, (TRAVATAN) 0.004 % ophthalmic solution Place 1 drop into both eyes daily.     . traZODone (DESYREL) 50 MG tablet TAKE 1 TABLET BY MOUTH EVERY DAY AT BEDTIME FOR SLEEP  5     PAST MEDICAL HISTORY: Past Medical History  Diagnosis Date  . OSA (obstructive sleep apnea)   . GERD (gastroesophageal reflux disease)   . Diabetes mellitus type II   . Asthma   . DVT (deep venous thrombosis) (HCC)     chronic  . Post-phlebitic syndrome   . Polycythemia vera(238.4)     Ennever  . COPD (chronic obstructive pulmonary disease) ([place])   . Hypertension   . Tremors of nervous system   . Gait difficulty   . Memory loss     PAST SURGICAL HISTORY: Past Surgical History  Procedure Laterality Date  . Tonsillectomy    . Artrial bypass      x 5  . Back surgery    . Broken leg surgery      FAMILY HISTORY: Family History  Problem Relation Age of Onset  . Emphysema Sister   . Allergies Sister   . Asthma Sister   . Heart disease Brother   . Heart disease Brother   . Rheum arthritis Sister   . Rheum arthritis Mother   . Cancer Mother   . Cancer Sister   . Kidney failure Father   . Stroke Sister     SOCIAL HISTORY:  Social History   Social History  . Marital Status: Married    Spouse Name: N/A  . Number of Children: 4  . Years of Education: Masters   Occupational History  . Retired     Company secretary   Social History Main Topics  . Smoking status: Never Smoker   . Smokeless tobacco: Never Used     Comment: never used tobaccco  . Alcohol Use: No  . Drug Use: No  . Sexual Activity: Not on file   Other Topics  Concern  . Not on file   Social History Narrative   Lives at home with his wife and his daughter, Donald Kaiser.   Right-handed.   Occasional caffeine use.     PHYSICAL EXAM   Filed Vitals:   01/18/15 0801  BP: 128/78  Pulse: 68    Not recorded      There is no weight on file to calculate BMI.  PHYSICAL EXAMNIATION:  Gen: NAD, conversant, well nourised, obese, well groomed  Cardiovascular: Regular rate rhythm, no peripheral edema, warm, nontender. Eyes: Conjunctivae clear without exudates or hemorrhage Neck: Supple, no carotid bruise. Pulmonary: Clear to auscultation bilaterally   NEUROLOGICAL EXAM:  MENTAL STATUS: Speech:    Speech is normal; fluent and spontaneous with normal comprehension.  Cognition: Mini-Mental Status Examination was 21 out of 27 animal naming was 5,       Orientation to time, place and person: He is not oriented to date, day, Dr.     Recent and remote memory: He missed 2 out of 3 recalls     Normal Attention span and concentration     Normal Language, naming, repeating,spontaneous speech     Fund of knowledge   CRANIAL NERVES: CN II: Pupils were irregular sluggish reactive to light. Poor vision CN III, IV, VI: extraocular movement are normal. No ptosis. CN V: Facial sensation is intact to pinprick in all 3 divisions bilaterally. Corneal responses are intact.  CN VII: Face is symmetric with normal eye closure and smile. CN VIII: Hearing is normal to rubbing fingers CN IX, X: Palate elevates symmetrically. Phonation is normal. CN XI: Head turning and shoulder shrug are intact CN XII: Tongue is midline with normal movements and no atrophy.  MOTOR: Normal muscle tone and bulk, he has mild to moderate bilateral ankle dorsiflexion weakness  REFLEXES: Reflexes are 1 and symmetric at the biceps, triceps, absent at knees, and ankles. Plantar responses are flexor.  SENSORY: Length dependent sensory changes  COORDINATION: Rapid  alternating movements and fine finger movements are intact. There is no dysmetria on finger-to-nose and heel-knee-shin.    GAIT/STANCE: He needs 2 people assistant to get up from seated position, difficulty initiate gait   DIAGNOSTIC DATA (LABS, IMAGING, TESTING) - I reviewed patient records, labs, notes, testing and imaging myself where available.   ASSESSMENT AND PLAN  LEMMIE VANLANEN is a 81 y.o. male   Bilateral visual loss, visual hallucinations  He has significant atrophy on the CAT scan of the brain,  His hallucination are likely due to his visual loss, and central nervous system degenerative disorder  Will stop Haldol, switch to seroquel 25 mg 1-2 tablets every night Memory loss  Check TSH, B12  Continue Namenda 10 mg twice a day Gait difficulty  Multifactorial, this is due to combination of low back pain, lumbar radiculopathy, deconditioning, poor vision, Urinary incontinence   Most likely related to his lumbar stenosis   He denies difficulty initiate urination, will hold off Flomax   Try Vesicare for frequent nocturia    Marcial Pacas, M.D. Ph.D.  [facility] [address], [place], [place] [address] Ph: [phone] Fax: [phone]  CC: Dr. Shon Baton

## 2015-01-19 ENCOUNTER — Encounter (HOSPITAL_COMMUNITY): Payer: Self-pay | Admitting: Emergency Medicine

## 2015-01-19 ENCOUNTER — Inpatient Hospital Stay (HOSPITAL_COMMUNITY)
Admission: EM | Admit: 2015-01-19 | Discharge: 2015-01-23 | DRG: 092 | Disposition: A | Discharge status: Skilled Nursing Facility | Payer: Medicare Other | Attending: Family Medicine | Admitting: Family Medicine

## 2015-01-19 ENCOUNTER — Inpatient Hospital Stay (HOSPITAL_COMMUNITY): Payer: Medicare Other

## 2015-01-19 ENCOUNTER — Emergency Department (HOSPITAL_COMMUNITY): Payer: Medicare Other

## 2015-01-19 ENCOUNTER — Telehealth: Payer: Self-pay | Admitting: *Deleted

## 2015-01-19 DIAGNOSIS — H548 Legal blindness, as defined in USA: Secondary | ICD-10-CM | POA: Diagnosis present

## 2015-01-19 DIAGNOSIS — I639 Cerebral infarction, unspecified: Secondary | ICD-10-CM

## 2015-01-19 DIAGNOSIS — D45 Polycythemia vera: Secondary | ICD-10-CM

## 2015-01-19 DIAGNOSIS — E11649 Type 2 diabetes mellitus with hypoglycemia without coma: Secondary | ICD-10-CM | POA: Diagnosis not present

## 2015-01-19 DIAGNOSIS — Z86718 Personal history of other venous thrombosis and embolism: Secondary | ICD-10-CM

## 2015-01-19 DIAGNOSIS — I82401 Acute embolism and thrombosis of unspecified deep veins of right lower extremity: Secondary | ICD-10-CM

## 2015-01-19 DIAGNOSIS — M545 Low back pain: Secondary | ICD-10-CM | POA: Diagnosis present

## 2015-01-19 DIAGNOSIS — E1142 Type 2 diabetes mellitus with diabetic polyneuropathy: Secondary | ICD-10-CM | POA: Diagnosis present

## 2015-01-19 DIAGNOSIS — R4182 Altered mental status, unspecified: Secondary | ICD-10-CM | POA: Diagnosis present

## 2015-01-19 DIAGNOSIS — Z794 Long term (current) use of insulin: Secondary | ICD-10-CM

## 2015-01-19 DIAGNOSIS — R627 Adult failure to thrive: Secondary | ICD-10-CM

## 2015-01-19 DIAGNOSIS — Z7982 Long term (current) use of aspirin: Secondary | ICD-10-CM

## 2015-01-19 DIAGNOSIS — F0390 Unspecified dementia without behavioral disturbance: Secondary | ICD-10-CM | POA: Diagnosis present

## 2015-01-19 DIAGNOSIS — Z8673 Personal history of transient ischemic attack (TIA), and cerebral infarction without residual deficits: Secondary | ICD-10-CM | POA: Diagnosis not present

## 2015-01-19 DIAGNOSIS — I10 Essential (primary) hypertension: Secondary | ICD-10-CM | POA: Diagnosis present

## 2015-01-19 DIAGNOSIS — Z79899 Other long term (current) drug therapy: Secondary | ICD-10-CM

## 2015-01-19 DIAGNOSIS — J449 Chronic obstructive pulmonary disease, unspecified: Secondary | ICD-10-CM | POA: Diagnosis present

## 2015-01-19 DIAGNOSIS — R32 Unspecified urinary incontinence: Secondary | ICD-10-CM | POA: Diagnosis present

## 2015-01-19 DIAGNOSIS — Z951 Presence of aortocoronary bypass graft: Secondary | ICD-10-CM | POA: Diagnosis not present

## 2015-01-19 DIAGNOSIS — Z8249 Family history of ischemic heart disease and other diseases of the circulatory system: Secondary | ICD-10-CM

## 2015-01-19 DIAGNOSIS — G92 Toxic encephalopathy: Secondary | ICD-10-CM | POA: Diagnosis present

## 2015-01-19 DIAGNOSIS — E873 Alkalosis: Secondary | ICD-10-CM | POA: Diagnosis present

## 2015-01-19 DIAGNOSIS — E119 Type 2 diabetes mellitus without complications: Secondary | ICD-10-CM

## 2015-01-19 DIAGNOSIS — N400 Enlarged prostate without lower urinary tract symptoms: Secondary | ICD-10-CM | POA: Diagnosis present

## 2015-01-19 DIAGNOSIS — G9340 Encephalopathy, unspecified: Secondary | ICD-10-CM | POA: Diagnosis not present

## 2015-01-19 DIAGNOSIS — G8929 Other chronic pain: Secondary | ICD-10-CM | POA: Diagnosis present

## 2015-01-19 DIAGNOSIS — L89151 Pressure ulcer of sacral region, stage 1: Secondary | ICD-10-CM | POA: Diagnosis present

## 2015-01-19 DIAGNOSIS — R6251 Failure to thrive (child): Secondary | ICD-10-CM | POA: Insufficient documentation

## 2015-01-19 DIAGNOSIS — K219 Gastro-esophageal reflux disease without esophagitis: Secondary | ICD-10-CM | POA: Diagnosis present

## 2015-01-19 DIAGNOSIS — Z7951 Long term (current) use of inhaled steroids: Secondary | ICD-10-CM

## 2015-01-19 DIAGNOSIS — G4733 Obstructive sleep apnea (adult) (pediatric): Secondary | ICD-10-CM | POA: Diagnosis present

## 2015-01-19 DIAGNOSIS — E1165 Type 2 diabetes mellitus with hyperglycemia: Secondary | ICD-10-CM | POA: Diagnosis present

## 2015-01-19 DIAGNOSIS — Z993 Dependence on wheelchair: Secondary | ICD-10-CM

## 2015-01-19 DIAGNOSIS — J45909 Unspecified asthma, uncomplicated: Secondary | ICD-10-CM | POA: Diagnosis present

## 2015-01-19 HISTORY — DX: Atherosclerotic heart disease of native coronary artery without angina pectoris: I25.10

## 2015-01-19 HISTORY — DX: Unspecified osteoarthritis, unspecified site: M19.90

## 2015-01-19 LAB — COMPREHENSIVE METABOLIC PANEL
ALK PHOS: 76 U/L (ref 38–126)
ALT: 25 U/L (ref 17–63)
ANION GAP: 8 (ref 5–15)
AST: 34 U/L (ref 15–41)
Albumin: 3.1 g/dL — ABNORMAL LOW (ref 3.5–5.0)
BUN: 25 mg/dL — ABNORMAL HIGH (ref 6–20)
CALCIUM: 8.9 mg/dL (ref 8.9–10.3)
CO2: 27 mmol/L (ref 22–32)
Chloride: 104 mmol/L (ref 101–111)
Creatinine, Ser: 1.33 mg/dL — ABNORMAL HIGH (ref 0.61–1.24)
GFR, EST AFRICAN AMERICAN: 54 mL/min — AB (ref >60)
GFR, EST NON AFRICAN AMERICAN: 47 mL/min — AB (ref >60)
Glucose, Bld: 239 mg/dL — ABNORMAL HIGH (ref 65–99)
Potassium: 3.8 mmol/L (ref 3.5–5.1)
SODIUM: 139 mmol/L (ref 135–145)
TOTAL PROTEIN: 5.2 g/dL — AB (ref 6.5–8.1)
Total Bilirubin: 0.9 mg/dL (ref 0.3–1.2)

## 2015-01-19 LAB — URINALYSIS, ROUTINE W REFLEX MICROSCOPIC
BILIRUBIN URINE: NEGATIVE
GLUCOSE, UA: 250 mg/dL — AB
HGB URINE DIPSTICK: NEGATIVE
Ketones, ur: NEGATIVE mg/dL
Leukocytes, UA: NEGATIVE
Nitrite: NEGATIVE
PROTEIN: 30 mg/dL — AB
SPECIFIC GRAVITY, URINE: 1.024 (ref 1.005–1.030)
UROBILINOGEN UA: 1 mg/dL (ref 0.0–1.0)
pH: 5.5 (ref 5.0–8.0)

## 2015-01-19 LAB — I-STAT ARTERIAL BLOOD GAS, ED
ACID-BASE EXCESS: 4 mmol/L — AB (ref 0.0–2.0)
BICARBONATE: 28.3 meq/L — AB (ref 20.0–24.0)
O2 SAT: 96 %
PCO2 ART: 38.9 mmHg (ref 35.0–45.0)
PH ART: 7.468 — AB (ref 7.350–7.450)
PO2 ART: 74 mmHg — AB (ref 80.0–100.0)
Patient temperature: 97.8
TCO2: 29 mmol/L (ref 0–100)

## 2015-01-19 LAB — I-STAT CHEM 8, ED
BUN: 30 mg/dL — ABNORMAL HIGH (ref 6–20)
CALCIUM ION: 1.09 mmol/L — AB (ref 1.13–1.30)
Chloride: 100 mmol/L — ABNORMAL LOW (ref 101–111)
Creatinine, Ser: 1.2 mg/dL (ref 0.61–1.24)
GLUCOSE: 243 mg/dL — AB (ref 65–99)
HCT: 43 % (ref 39.0–52.0)
HEMOGLOBIN: 14.6 g/dL (ref 13.0–17.0)
Potassium: 3.7 mmol/L (ref 3.5–5.1)
SODIUM: 138 mmol/L (ref 135–145)
TCO2: 27 mmol/L (ref 0–100)

## 2015-01-19 LAB — GLUCOSE, CAPILLARY: Glucose-Capillary: 345 mg/dL — ABNORMAL HIGH (ref 65–99)

## 2015-01-19 LAB — I-STAT TROPONIN, ED: Troponin i, poc: 0 ng/mL (ref 0.00–0.08)

## 2015-01-19 LAB — CBC WITH DIFFERENTIAL/PLATELET
BASOS ABS: 0 10*3/uL (ref 0.0–0.1)
BASOS PCT: 0 %
EOS ABS: 0.2 10*3/uL (ref 0.0–0.7)
EOS PCT: 2 %
HCT: 42.1 % (ref 39.0–52.0)
Hemoglobin: 14.2 g/dL (ref 13.0–17.0)
Lymphocytes Relative: 19 %
Lymphs Abs: 1.2 10*3/uL (ref 0.7–4.0)
MCH: 29 pg (ref 26.0–34.0)
MCHC: 33.7 g/dL (ref 30.0–36.0)
MCV: 85.9 fL (ref 78.0–100.0)
Monocytes Absolute: 0.6 10*3/uL (ref 0.1–1.0)
Monocytes Relative: 9 %
Neutro Abs: 4.7 10*3/uL (ref 1.7–7.7)
Neutrophils Relative %: 70 %
PLATELETS: 98 10*3/uL — AB (ref 150–400)
RBC: 4.9 MIL/uL (ref 4.22–5.81)
RDW: 13.8 % (ref 11.5–15.5)
WBC: 6.7 10*3/uL (ref 4.0–10.5)

## 2015-01-19 LAB — CBG MONITORING, ED
GLUCOSE-CAPILLARY: 233 mg/dL — AB (ref 65–99)
GLUCOSE-CAPILLARY: 254 mg/dL — AB (ref 65–99)

## 2015-01-19 LAB — URINE MICROSCOPIC-ADD ON

## 2015-01-19 LAB — VITAMIN B12
VITAMIN B 12: 627 pg/mL (ref 211–946)
Vitamin B-12: 490 pg/mL (ref 180–914)

## 2015-01-19 LAB — TSH
TSH: 3.88 u[IU]/mL (ref 0.450–4.500)
TSH: 1.799 u[IU]/mL (ref 0.350–4.500)

## 2015-01-19 LAB — TROPONIN I: Troponin I: <0.03 ng/mL (ref <0.031)

## 2015-01-19 LAB — I-STAT CG4 LACTIC ACID, ED: LACTIC ACID, VENOUS: 0.97 mmol/L (ref 0.5–2.0)

## 2015-01-19 MED ORDER — MONTELUKAST SODIUM 10 MG PO TABS
10.0000 mg | ORAL_TABLET | Freq: Every day | ORAL | Status: DC
  Administered 2015-01-19 – 2015-01-23 (×5): 10 mg via ORAL
  Filled 2015-01-19 (×5): qty 1

## 2015-01-19 MED ORDER — ASPIRIN 325 MG PO TABS
325.0000 mg | ORAL_TABLET | Freq: Every day | ORAL | Status: DC
  Administered 2015-01-19 – 2015-01-22 (×4): 325 mg via ORAL
  Filled 2015-01-19 (×4): qty 1

## 2015-01-19 MED ORDER — ACETAMINOPHEN 650 MG RE SUPP: 650.0000 mg | RECTAL | Status: DC | PRN

## 2015-01-19 MED ORDER — MEMANTINE HCL 10 MG PO TABS
5.0000 mg | ORAL_TABLET | Freq: Every day | ORAL | Status: DC
  Administered 2015-01-19 – 2015-01-22 (×4): 5 mg via ORAL
  Filled 2015-01-19 (×4): qty 1

## 2015-01-19 MED ORDER — STROKE: EARLY STAGES OF RECOVERY BOOK
Freq: Once | Status: AC
  Administered 2015-01-19: 21:00:00
  Filled 2015-01-19: qty 1

## 2015-01-19 MED ORDER — TRAZODONE HCL 50 MG PO TABS
50.0000 mg | ORAL_TABLET | Freq: Every day | ORAL | Status: DC
  Administered 2015-01-19: 50 mg via ORAL
  Filled 2015-01-19: qty 1

## 2015-01-19 MED ORDER — OCUVITE-LUTEIN PO CAPS
1.0000 | ORAL_CAPSULE | Freq: Two times a day (BID) | ORAL | Status: DC
  Administered 2015-01-19 – 2015-01-23 (×8): 1 via ORAL
  Filled 2015-01-19 (×10): qty 1

## 2015-01-19 MED ORDER — TRAVOPROST (BAK FREE) 0.004 % OP SOLN
1.0000 [drp] | Freq: Every day | OPHTHALMIC | Status: DC
  Administered 2015-01-20 – 2015-01-22 (×3): 1 [drp] via OPHTHALMIC
  Filled 2015-01-19: qty 2.5

## 2015-01-19 MED ORDER — INSULIN ASPART 100 UNIT/ML ~~LOC~~ SOLN
0.0000 [IU] | Freq: Every day | SUBCUTANEOUS | Status: DC
  Administered 2015-01-19: 4 [IU] via SUBCUTANEOUS

## 2015-01-19 MED ORDER — BUDESONIDE 0.25 MG/2ML IN SUSP
2.0000 mL | Freq: Two times a day (BID) | RESPIRATORY_TRACT | Status: DC
  Administered 2015-01-20 – 2015-01-23 (×6): 0.25 mg via RESPIRATORY_TRACT
  Filled 2015-01-19 (×9): qty 2

## 2015-01-19 MED ORDER — SENNOSIDES-DOCUSATE SODIUM 8.6-50 MG PO TABS: 1.0000 | ORAL_TABLET | Freq: Every evening | ORAL | Status: DC | PRN

## 2015-01-19 MED ORDER — ENOXAPARIN SODIUM 40 MG/0.4ML ~~LOC~~ SOLN
40.0000 mg | SUBCUTANEOUS | Status: DC
  Administered 2015-01-19 – 2015-01-22 (×4): 40 mg via SUBCUTANEOUS
  Filled 2015-01-19 (×4): qty 0.4

## 2015-01-19 MED ORDER — FLUTICASONE PROPIONATE 50 MCG/ACT NA SUSP
1.0000 | Freq: Every day | NASAL | Status: DC
  Administered 2015-01-22 – 2015-01-23 (×2): 1 via NASAL
  Filled 2015-01-19: qty 16

## 2015-01-19 MED ORDER — TAMSULOSIN HCL 0.4 MG PO CAPS: 0.4000 mg | ORAL_CAPSULE | Freq: Every day | ORAL | Status: DC

## 2015-01-19 MED ORDER — ASPIRIN 300 MG RE SUPP: 300.0000 mg | Freq: Every day | RECTAL | Status: DC

## 2015-01-19 MED ORDER — SODIUM CHLORIDE 0.9 % IV SOLN
INTRAVENOUS | Status: DC
  Administered 2015-01-19: via INTRAVENOUS

## 2015-01-19 MED ORDER — SIMVASTATIN 40 MG PO TABS: 40.0000 mg | ORAL_TABLET | Freq: Every day | ORAL | Status: DC

## 2015-01-19 MED ORDER — INSULIN ASPART 100 UNIT/ML ~~LOC~~ SOLN
0.0000 [IU] | Freq: Three times a day (TID) | SUBCUTANEOUS | Status: DC
  Administered 2015-01-20: 3 [IU] via SUBCUTANEOUS
  Administered 2015-01-20: 2 [IU] via SUBCUTANEOUS
  Administered 2015-01-20: 1 [IU] via SUBCUTANEOUS
  Administered 2015-01-21: 3 [IU] via SUBCUTANEOUS
  Administered 2015-01-21 (×2): 2 [IU] via SUBCUTANEOUS
  Administered 2015-01-22: 1 [IU] via SUBCUTANEOUS
  Administered 2015-01-22 (×2): 2 [IU] via SUBCUTANEOUS
  Administered 2015-01-23: 3 [IU] via SUBCUTANEOUS
  Administered 2015-01-23: 1 [IU] via SUBCUTANEOUS

## 2015-01-19 MED ORDER — ACETAMINOPHEN 325 MG PO TABS: 650.0000 mg | ORAL_TABLET | ORAL | Status: DC | PRN

## 2015-01-19 NOTE — ED Notes (Signed)
Admitting MD at bedside.

## 2015-01-19 NOTE — H&P (Signed)
History and Physical        Hospital Admission Note Date: 01/19/2015  Patient name: Donald Kaiser Medical record number: [id number] Date of birth: December 28, 1926 Age: 81 y.o. Gender: male  PCP: [name], MD  Referring physician: Dr Raeford Razor  Chief Complaint:  Confusion with altered mental status  HPI: Patient is a 81 year old male with blindness, hypertension, chronic back pain, polycythemia vera, urinary incontinence, prior TIA 2 presented to ED with altered mental status. History was obtained from the patient's wife at the bedside. Last seen normal was last night around 7:30am. Per patient's wife, he woke up this morning around 6 AM, used the bedside urinal. However they noticed that he was very weak and could not get back to the bed. Patient and his wife live with her daughter who helped him get back into the bed, they also noticed that he had some right-sided facial drooping and was not acting himself. Patient went back to sleep and woke up again around 9 AM and they were unable to get him out of the bed, was complaining of being more fatigued and was not able to speak in complete sentences. Patient's wife also reported that he has been declining since May of this year with his overall function and cognitive behavior. She did not notice any fevers or chills, vomiting or diarrhea. She reports that his appetite has been good. Patient had seen his neurologist, Dr. [name] yesterday and was started on new medications Seroquel and Vesicare which she has not taken yet. Patient also has been off insulin for last 2 months. At baseline, patient is alert and oriented but needs a walker for ambulation  Review of Systems:  Unable to obtain from the patient due to his mental status, patient is that he is in the hospital but does not know why he is here  Past Medical History: Past Medical History    Diagnosis Date  . OSA (obstructive sleep apnea)   . GERD (gastroesophageal reflux disease)   . Diabetes mellitus type II   . Asthma   . DVT (deep venous thrombosis) (HCC)     chronic  . Post-phlebitic syndrome   . Polycythemia vera(238.4)     Ennever  . COPD (chronic obstructive pulmonary disease) ([place])   . Hypertension   . Tremors of nervous system   . Gait difficulty   . Memory loss     Past Surgical History  Procedure Laterality Date  . Tonsillectomy    . Artrial bypass      x 5  . Back surgery    . Broken leg surgery      Medications: Prior to Admission medications   Medication Sig Start Date End Date Taking? Authorizing Provider  albuterol-ipratropium (COMBIVENT) 18-103 MCG/ACT inhaler Inhale 1-2 puffs into the lungs every 4 (four) hours as needed.      Historical Provider, MD  aspirin 81 MG tablet Take 81 mg by mouth daily.      Historical Provider, MD  desonide (DESOWEN) 0.05 % cream Apply topically as needed.  05/29/12   Historical Provider, MD  Ergocalciferol (VITAMIN D2) 2000 UNITS TABS Take 1 tablet by mouth daily.  Historical Provider, MD  fluocinonide (LIDEX) 0.05 % external solution Apply topically as needed.  05/29/12   Historical Provider, MD  furosemide (LASIX) 40 MG tablet Take 20 mg by mouth as needed.     Historical Provider, MD  insulin NPH-insulin regular (NOVOLIN 70/30) (70-30) 100 UNIT/ML injection Inject into the skin daily with breakfast. 20 units in the morning And 5 units in evening    Historical Provider, MD  memantine (NAMENDA) 5 MG tablet Take 5 mg by mouth at bedtime. 12/20/14   Historical Provider, MD  metoprolol (LOPRESSOR) 50 MG tablet Take 50 mg by mouth daily.     Historical Provider, MD  mometasone (ASMANEX 120 METERED DOSES) 220 MCG/INH inhaler Inhale 2 puffs into the lungs daily.     Historical Provider, MD  mometasone (NASONEX) 50 MCG/ACT nasal spray Place 2 sprays into the nose daily. 04/06/13   Elsie Stain, MD  montelukast  (SINGULAIR) 10 MG tablet Take 10 mg by mouth daily.      Historical Provider, MD  Multiple Vitamins-Minerals (PRESERVISION AREDS PO) Take 1 capsule by mouth 2 (two) times daily.      Historical Provider, MD  nitroGLYCERIN (NITROSTAT) 0.4 MG SL tablet Place 0.4 mg under the tongue every 5 (five) minutes as needed.      Historical Provider, MD  QUEtiapine (SEROQUEL) 25 MG tablet One to two tablets every night 01/18/15   Marcial Pacas, MD  simvastatin (ZOCOR) 40 MG tablet Take 40 mg by mouth at bedtime.      Historical Provider, MD  solifenacin (VESICARE) 5 MG tablet Take 1 tablet (5 mg total) by mouth daily. 01/18/15   Marcial Pacas, MD  tamsulosin (FLOMAX) 0.4 MG CAPS capsule TAKE 1 CAPSULE BY MOUTH EVERY DAY AFTER DINNER 12/07/14   Historical Provider, MD  traMADol (ULTRAM) 50 MG tablet Take 50 mg by mouth every 6 (six) hours as needed.    Historical Provider, MD  travoprost, benzalkonium, (TRAVATAN) 0.004 % ophthalmic solution Place 1 drop into both eyes daily.     Historical Provider, MD  traZODone (DESYREL) 50 MG tablet TAKE 1 TABLET BY MOUTH EVERY DAY AT BEDTIME FOR SLEEP 12/07/14   Historical Provider, MD    Allergies:  No Known Allergies  Social History: Per records, reports that he has never smoked. He has never used smokeless tobacco. He reports that he does not drink alcohol or use illicit drugs. patient and his wife live with their daughter, he uses a walker for ablation  Family History: Family History  Problem Relation Age of Onset  . Emphysema Sister   . Allergies Sister   . Asthma Sister   . Heart disease Brother   . Heart disease Brother   . Rheum arthritis Sister   . Rheum arthritis Mother   . Cancer Mother   . Cancer Sister   . Kidney failure Father   . Stroke Sister     Physical Exam: Blood pressure 124/62, pulse 58, temperature 97.8 F (36.6 C), temperature source Rectal, resp. rate 13, SpO2 98 %. General: Alert, awake, oriented x2, in no acute distress. HEENT:  normocephalic, atraumatic, anicteric sclera, pink conjunctiva, pupils equal and reactive to light and accomodation, oropharynx clear Neck: supple, no masses or lymphadenopathy, no goiter, no bruits  Heart: Regular rate and rhythm, without murmurs, rubs or gallops. Lungs: Clear to auscultation bilaterally, no wheezing, rales or rhonchi. Abdomen: Soft, nontender, nondistended, positive bowel sounds, no masses. Extremities: No clubbing, cyanosis. Bilateral edema right (1+) more than  left (trace), tremor, sacral decub stage I Neuro: strength 5/5 in the upper extremities, 4/5 in both lower extremities, however was not able to cerebellar testing Psych: alert and oriented x 2, normal mood and affect Skin: no rashes or lesions, warm and dry   LABS on Admission:  Basic Metabolic Panel:  Recent Labs Lab 01/19/15 1133 01/19/15 1138  NA 139 138  K 3.8 3.7  CL 104 100*  CO2 27  --   GLUCOSE 239* 243*  BUN 25* 30*  CREATININE 1.33* 1.20  CALCIUM 8.9  --    Liver Function Tests:  Recent Labs Lab 01/19/15 1133  AST 34  ALT 25  ALKPHOS 76  BILITOT 0.9  PROT 5.2*  ALBUMIN 3.1*   No results for input(s): LIPASE, AMYLASE in the last 168 hours. No results for input(s): AMMONIA in the last 168 hours. CBC:  Recent Labs Lab 01/19/15 1133 01/19/15 1138  WBC 6.7  --   NEUTROABS 4.7  --   HGB 14.2 14.6  HCT 42.1 43.0  MCV 85.9  --   PLT 98*  --    Cardiac Enzymes: No results for input(s): CKTOTAL, CKMB, CKMBINDEX, TROPONINI in the last 168 hours. BNP: Invalid input(s): POCBNP CBG:  Recent Labs Lab 01/19/15 1018  GLUCAP 233*    Radiological Exams on Admission:  Ct Head Wo Contrast  01/19/2015  CLINICAL DATA:  Altered mental status, nonverbal EXAM: CT HEAD WITHOUT CONTRAST TECHNIQUE: Contiguous axial images were obtained from the base of the skull through the vertex without intravenous contrast. COMPARISON:  None. FINDINGS: There is no evidence of mass effect, midline shift,  or extra-axial fluid collections. There is no evidence of a space-occupying lesion or intracranial hemorrhage. There is no evidence of a cortical-based area of acute infarction. There is generalized cerebral atrophy. There is periventricular white matter low attenuation likely secondary to microangiopathy. The ventricles and sulci are appropriate for the patient's age. The basal cisterns are patent. Visualized portions of the orbits are unremarkable. Complete opacification of the right maxillary sinus. There is rightward deviation of the nasal septum. Cerebrovascular atherosclerotic calcifications are noted. The osseous structures are unremarkable. IMPRESSION: 1. No acute intracranial pathology. 2. Chronic microvascular disease and cerebral atrophy. Electronically Signed   By: Kathreen Devoid   On: 01/19/2015 11:32   Dg Chest Port 1 View  01/19/2015  CLINICAL DATA:  Altered mental status EXAM: PORTABLE CHEST 1 VIEW COMPARISON:  04/06/2011 FINDINGS: Normal heart size and stable aortic tortuosity post CABG. There is no edema, consolidation, effusion, or pneumothorax. Calcified granuloma in the right lung. No acute osseous finding. IMPRESSION: Stable exam.  No evidence of acute cardiopulmonary disease. Electronically Signed   By: Monte Fantasia M.D.   On: 01/19/2015 11:31    *I have personally reviewed the images above*  EKG: Independently reviewed. AV block? Complete, right bundle branch block with repolarization changes   Assessment/Plan Principal Problem:   Acute encephalopathy with concern of stroke, has a history of TIA 2 per his wife, uncontrolled diabetes - No infectious source identified, UA negative for UTI, chest x-ray negative for  pneumonia, CT head negative for acute infarct, showed cerebral atrophy - Obtain MRI of the brain/MRA to rule out CVA, 2-D echo, carotid Dopplers - I have requested for neurology consult - Place on aspirin 325 mg daily, passed swallow testing, placed on carb  modified diet -Obtain lipid panel, hemoglobin A1c - PTOT consult  Active Problems:   Diabetes mellitus ([place]) uncontrolled with hyperglycemia and  neurological complications - Obtain hemoglobin A 1C, place on sliding scale insulin  Asthma - Currently stable, no wheezing, continue Singulair, Asmanex, albuterol inhaler as needed    G E R D - Continue PPI    Polycythemia vera (HCC) - Follows Dr. [name], last seen on 9/27, has phlebotomy to keep hematocrit less than 45%, aspirin    Adult failure to thrive - PTOT consult  Right lower extremity edema - Obtain RLE venous duplex to rule out DVT  Abnormal EKG Patient is not currently having any symptoms of chest pain or shortness of breath - Obtain serial cardiac enzymes, 2-D echocardiogram  Stage I decub ulcer sacral - Wound care consult  DVT prophylaxis: Lovenox  CODE STATUS: Full code  Family Communication: Admission, patients condition and plan of care including tests being ordered have been discussed with the patient and wife who indicates understanding and agree with the plan and Code Status   Time Spent on Admission: 81 minutes  Meilyn Heindl M.D. [facility] 01/19/2015, 4:34 PM Pager: [phone]  If 7PM-7AM, please contact night-coverage [email or web address] Password [email or web address]

## 2015-01-19 NOTE — Evaluation (Signed)
Clinical/Bedside Swallow Evaluation Patient Details  Name: Donald Kaiser MRN: [id number] Date of Birth: [birth year]  Today's Date: 01/19/2015 Time: SLP Start Time (ACUTE ONLY): 1500 SLP Stop Time (ACUTE ONLY): 1515 SLP Time Calculation (min) (ACUTE ONLY): 15 min  Past Medical History:  Past Medical History  Diagnosis Date  . OSA (obstructive sleep apnea)   . GERD (gastroesophageal reflux disease)   . Diabetes mellitus type II   . Asthma   . DVT (deep venous thrombosis) (HCC)     chronic  . Post-phlebitic syndrome   . Polycythemia vera(238.4)     Ennever  . COPD (chronic obstructive pulmonary disease) ([place])   . Hypertension   . Tremors of nervous system   . Gait difficulty   . Memory loss    Past Surgical History:  Past Surgical History  Procedure Laterality Date  . Tonsillectomy    . Artrial bypass      x 5  . Back surgery    . Broken leg surgery     HPI:  81 y.o. male brought via [facility] from home with altered mental status, last seen normal last night (01/18/15) at 1930. Responds to voice. Pt has hx of macular degeneration and is blind, HTN, chronic back pain, polycythemia, and is ambulatory at home with a walker. Pt seen this week by neurologist and old undiagnosed CVAs.  Failed RN stroke swallow screen; swallow eval ordered.    Assessment / Plan / Recommendation Clinical Impression  Pt with delayed responses, acknowledges he is not quite at baseline, but much improved since brought to ED.  Swallow is [facility] with adequate mastication, brisk swallow response, no s/s of aspiration.  Recommend resume regular diet, thin liquids.  No SLP f u.     Aspiration Risk  Mild    Diet Recommendation Age appropriate regular solids;Thin   Medication Administration: Whole meds with liquid    Other  Recommendations Oral Care Recommendations: Oral care BID     Swallow Study Prior Functional Status       General Date of Onset: 01/19/15 Other Pertinent Information: 81 y.o. male  brought via [facility] from home with altered mental status, last seen normal last night (01/18/15) at 1930. Responds to voice. Pt has hx of macular degeneration and is blind, HTN, chronic back pain, polycythemia, and is ambulatory at home with a walker. Pt seen this week by neurologist and old undiagnosed CVAs.  Failed RN stroke swallow screen; swallow eval ordered.  Type of Study: Bedside swallow evaluation Previous Swallow Assessment: none Diet Prior to this Study: NPO Temperature Spikes Noted: No Respiratory Status: Room air History of Recent Intubation: No Behavior/Cognition: Alert;Cooperative;Confused Oral Cavity - Dentition: Adequate natural dentition/normal for age Self-Feeding Abilities: Able to feed self Patient Positioning: Upright in bed Baseline Vocal Quality: Normal Volitional Cough: Strong Volitional Swallow: Able to elicit    Oral/Motor/Sensory Function Overall Oral Motor/Sensory Function: Appears within functional limits for tasks assessed   Ice Chips Ice chips: Within functional limits   Thin Liquid Thin Liquid: Within functional limits Presentation: Cup;Straw    Nectar Thick Nectar Thick Liquid: Not tested   Honey Thick Honey Thick Liquid: Not tested   Puree Puree: Within functional limits   Solid   GO Functional Assessment Tool Used: clinical judgement Functional Limitations: Swallowing Swallow Current Status (J0932): 0 percent impaired, limited or restricted Swallow Goal Status (I7124): 0 percent impaired, limited or restricted Swallow Discharge Status [phone]): 0 percent impaired, limited or restricted  Solid: Within functional  limits       Donald Kaiser 01/19/2015,3:21 PM

## 2015-01-19 NOTE — ED Provider Notes (Signed)
CSN: [id number]     Arrival date & time 01/19/15  1006 History   First MD Initiated Contact with Patient 01/19/15 1013     Chief Complaint  Patient presents with  . Altered Mental Status     (Consider location/radiation/quality/duration/timing/severity/associated sxs/prior Treatment) Patient is a 81 y.o. male presenting with altered mental status. The history is provided by the spouse and a relative.  Altered Mental Status Severity:  Moderate Most recent episode:  Today Episode history:  Single Timing:  Constant Progression:  Unchanged Chronicity:  New Context: nursing home resident   Associated symptoms: no abdominal pain, no fever, no nausea, no rash and no vomiting    Mr. Rudy is a 81 yo M PMH blindness, HTN, chronic back pain, polycythemia vera, and urinary incontinence and is presenting with altered mental status. History is provided by his daughter and spouse. He was last seen normal on 10/12 at 1930. He was up at 0600 this morning and used the bedside urinal. His daughter helped him get back into bed and he was not able to say completed sentences and she thought she noticed some right sided facial drooping. He went back to sleep and they woke him again at 0800 and he was not acting himself. He was more tired and not speaking in complete sentences. They woke up again at 0900 and were unable to get him out of bed. They called EMS and transported to [facility] ED. Has had a noticeable decline since May in his overall function and cognitive status. He was seen at the Neurologist yesterday that started new medications (seroquel, vesicare) but hasn't taken them yet. He hasn't taken any medications today. He has been off insulin for two months and his blood sugars run in the 200-250's. He has been on haldol for about a month.  Daughter denies him having any fevers, chills, nausea, vomiting, constipation, diarrhea, chest pain, SOB, abdominal pain, or rash. He does have sacral ulcer, urinary  incontinence and dysphagia.  Daughter did report fowl smelling odor from his urine. He wears sanitary diapers at baseline. At his baseline he needs help ambulating and uses walker.   Past Medical History  Diagnosis Date  . OSA (obstructive sleep apnea)   . GERD (gastroesophageal reflux disease)   . Diabetes mellitus type II   . Asthma   . DVT (deep venous thrombosis) (HCC)     chronic  . Post-phlebitic syndrome   . Polycythemia vera(238.4)     Ennever  . COPD (chronic obstructive pulmonary disease) ([place])   . Hypertension   . Tremors of nervous system   . Gait difficulty   . Memory loss    Past Surgical History  Procedure Laterality Date  . Tonsillectomy    . Artrial bypass      x 5  . Back surgery    . Broken leg surgery     Family History  Problem Relation Age of Onset  . Emphysema Sister   . Allergies Sister   . Asthma Sister   . Heart disease Brother   . Heart disease Brother   . Rheum arthritis Sister   . Rheum arthritis Mother   . Cancer Mother   . Cancer Sister   . Kidney failure Father   . Stroke Sister    Social History  Substance Use Topics  . Smoking status: Never Smoker   . Smokeless tobacco: Never Used     Comment: never used tobaccco  . Alcohol Use: No  Review of Systems  Constitutional: Negative for fever and chills.  Respiratory: Positive for cough. Negative for shortness of breath.   Cardiovascular: Negative for chest pain.  Gastrointestinal: Negative for nausea, vomiting, abdominal pain and constipation.  Genitourinary: Negative for dysuria.  Musculoskeletal: Positive for back pain and gait problem.  Skin: Negative for rash.  Neurological: Positive for tremors.      Allergies  Review of patient's allergies indicates no known allergies.  Home Medications   Prior to Admission medications   Medication Sig Start Date End Date Taking? Authorizing Provider  albuterol-ipratropium (COMBIVENT) 18-103 MCG/ACT inhaler Inhale 1-2 puffs into  the lungs every 4 (four) hours as needed.      Historical Provider, MD  aspirin 81 MG tablet Take 81 mg by mouth daily.      Historical Provider, MD  desonide (DESOWEN) 0.05 % cream Apply topically as needed.  05/29/12   Historical Provider, MD  Ergocalciferol (VITAMIN D2) 2000 UNITS TABS Take 1 tablet by mouth daily.      Historical Provider, MD  fluocinonide (LIDEX) 0.05 % external solution Apply topically as needed.  05/29/12   Historical Provider, MD  furosemide (LASIX) 40 MG tablet Take 20 mg by mouth as needed.     Historical Provider, MD  insulin NPH-insulin regular (NOVOLIN 70/30) (70-30) 100 UNIT/ML injection Inject into the skin daily with breakfast. 20 units in the morning And 5 units in evening    Historical Provider, MD  memantine (NAMENDA) 5 MG tablet Take 5 mg by mouth at bedtime. 12/20/14   Historical Provider, MD  metoprolol (LOPRESSOR) 50 MG tablet Take 50 mg by mouth daily.     Historical Provider, MD  mometasone (ASMANEX 120 METERED DOSES) 220 MCG/INH inhaler Inhale 2 puffs into the lungs daily.     Historical Provider, MD  mometasone (NASONEX) 50 MCG/ACT nasal spray Place 2 sprays into the nose daily. 04/06/13   Elsie Stain, MD  montelukast (SINGULAIR) 10 MG tablet Take 10 mg by mouth daily.      Historical Provider, MD  Multiple Vitamins-Minerals (PRESERVISION AREDS PO) Take 1 capsule by mouth 2 (two) times daily.      Historical Provider, MD  nitroGLYCERIN (NITROSTAT) 0.4 MG SL tablet Place 0.4 mg under the tongue every 5 (five) minutes as needed.      Historical Provider, MD  QUEtiapine (SEROQUEL) 25 MG tablet One to two tablets every night 01/18/15   Marcial Pacas, MD  simvastatin (ZOCOR) 40 MG tablet Take 40 mg by mouth at bedtime.      Historical Provider, MD  solifenacin (VESICARE) 5 MG tablet Take 1 tablet (5 mg total) by mouth daily. 01/18/15   Marcial Pacas, MD  tamsulosin (FLOMAX) 0.4 MG CAPS capsule TAKE 1 CAPSULE BY MOUTH EVERY DAY AFTER DINNER 12/07/14   Historical  Provider, MD  traMADol (ULTRAM) 50 MG tablet Take 50 mg by mouth every 6 (six) hours as needed.    Historical Provider, MD  travoprost, benzalkonium, (TRAVATAN) 0.004 % ophthalmic solution Place 1 drop into both eyes daily.     Historical Provider, MD  traZODone (DESYREL) 50 MG tablet TAKE 1 TABLET BY MOUTH EVERY DAY AT BEDTIME FOR SLEEP 12/07/14   Historical Provider, MD   BP 124/62 mmHg  Pulse 58  Temp(Src) 97.8 F (36.6 C) (Rectal)  Resp 13  SpO2 98% Physical Exam  Constitutional: He appears well-developed.  HENT:  Head: Normocephalic and atraumatic.  Cardiovascular: Normal rate, regular rhythm, normal heart sounds and intact  distal pulses.   No murmur heard. Pulmonary/Chest: Effort normal and breath sounds normal. No respiratory distress. He has no wheezes. He has no rales.  Abdominal: Soft. Bowel sounds are normal. He exhibits no distension. There is no tenderness. There is no rebound.  Musculoskeletal: He exhibits no edema.  Neurological: He is alert. He displays no Babinski's sign on the right side. He displays no Babinski's sign on the left side.  Oriented to self, DOB, and season.  Unable to follow commands (ie point to nose, push down with feet)  Tremor at baseline       ED Course  Procedures (including critical care time) Labs Review Labs Reviewed  CBC WITH DIFFERENTIAL/PLATELET - Abnormal; Notable for the following:    Platelets 98 (*)    All other components within normal limits  URINALYSIS, ROUTINE W REFLEX MICROSCOPIC (NOT AT [facility]) - Abnormal; Notable for the following:    Color, Urine AMBER (*)    Glucose, UA 250 (*)    Protein, ur 30 (*)    All other components within normal limits  COMPREHENSIVE METABOLIC PANEL - Abnormal; Notable for the following:    Glucose, Bld 239 (*)    BUN 25 (*)    Creatinine, Ser 1.33 (*)    Total Protein 5.2 (*)    Albumin 3.1 (*)    GFR calc non Af Amer 47 (*)    GFR calc Af Amer 54 (*)    All other components within normal  limits  URINE MICROSCOPIC-ADD ON - Abnormal; Notable for the following:    Casts HYALINE CASTS (*)    All other components within normal limits  CBG MONITORING, ED - Abnormal; Notable for the following:    Glucose-Capillary 233 (*)    All other components within normal limits  I-STAT CHEM 8, ED - Abnormal; Notable for the following:    Chloride 100 (*)    BUN 30 (*)    Glucose, Bld 243 (*)    Calcium, Ion 1.09 (*)    All other components within normal limits  I-STAT ARTERIAL BLOOD GAS, ED - Abnormal; Notable for the following:    pH, Arterial 7.468 (*)    pO2, Arterial 74.0 (*)    Bicarbonate 28.3 (*)    Acid-Base Excess 4.0 (*)    All other components within normal limits  I-STAT TROPOININ, ED  I-STAT CG4 LACTIC ACID, ED  CBG MONITORING, ED    Imaging Review Ct Head Wo Contrast  01/19/2015  CLINICAL DATA:  Altered mental status, nonverbal EXAM: CT HEAD WITHOUT CONTRAST TECHNIQUE: Contiguous axial images were obtained from the base of the skull through the vertex without intravenous contrast. COMPARISON:  None. FINDINGS: There is no evidence of mass effect, midline shift, or extra-axial fluid collections. There is no evidence of a space-occupying lesion or intracranial hemorrhage. There is no evidence of a cortical-based area of acute infarction. There is generalized cerebral atrophy. There is periventricular white matter low attenuation likely secondary to microangiopathy. The ventricles and sulci are appropriate for the patient's age. The basal cisterns are patent. Visualized portions of the orbits are unremarkable. Complete opacification of the right maxillary sinus. There is rightward deviation of the nasal septum. Cerebrovascular atherosclerotic calcifications are noted. The osseous structures are unremarkable. IMPRESSION: 1. No acute intracranial pathology. 2. Chronic microvascular disease and cerebral atrophy. Electronically Signed   By: Kathreen Devoid   On: 01/19/2015 11:32   Dg  Chest Port 1 View  01/19/2015  CLINICAL DATA:  Altered mental status EXAM: PORTABLE CHEST 1 VIEW COMPARISON:  04/06/2011 FINDINGS: Normal heart size and stable aortic tortuosity post CABG. There is no edema, consolidation, effusion, or pneumothorax. Calcified granuloma in the right lung. No acute osseous finding. IMPRESSION: Stable exam.  No evidence of acute cardiopulmonary disease. Electronically Signed   By: Monte Fantasia M.D.   On: 01/19/2015 11:31   I have personally reviewed and evaluated these images and lab results as part of my medical decision-making.   EKG Interpretation None      Medications - No data to display   MDM   Final diagnoses:  Altered mental status, unspecified altered mental status type   Mr. Dennie presented with altered mental status. Ct head was negative for acute changes. CXR was stable. Lab work has been unrevealing. ABG suggestive a metabolic alkalosis. Initial presentation was minimally responsive.  At his baseline he is able to ambulate with walker with minimal help but is now needing assistance just to sit on the side of the bed. Admit to hospitalist.  Rosemarie Ax, MD PGY-3, [facility] 01/19/2015, 4:24 PM       Rosemarie Ax, MD 01/19/15 [place], MD 01/20/15 2049  Serita Grit, MD 01/20/15 2051

## 2015-01-19 NOTE — Progress Notes (Signed)
Received report on patient from Vicente Males, ED nurse. Pt passed swallowing screen by SLP per Vicente Males, RN.

## 2015-01-19 NOTE — Telephone Encounter (Signed)
-----   Message from Marcial Pacas, MD sent at 01/19/2015  9:04 AM EDT ----- Please call patient for normal laboratory result

## 2015-01-19 NOTE — ED Notes (Signed)
Called speech pathology in order to have bedside swallow preformed.

## 2015-01-19 NOTE — ED Notes (Signed)
Patient transported to CT 

## 2015-01-19 NOTE — Progress Notes (Signed)
Admission Note  Patient arrived to floor in room [phone] via stretcher from ED. Patient alert and oriented X 4. Patient has blindness with both eyes. Vitals signs  Oral temperature 98.4 F       Blood pressure 126/48       Pulse 62       RR 15       SpO2 97 % on room air. Complains of chronic pain 3/10 over back. Skin intact, pressure ulcer stage I noted on mid sacral area. Telemetry monitor # 01 placed. Patient is on aspiration precaution, suction setup at bedside.   Patient's ID armband verified with patient/ family, and in place. Information packet given to patient/ family. Fall risk assessed, SR up X2, patient/ family able to verbalize understanding of risks associated with falls and to call nurse or staff to assist before getting out of bed. Patient/ family oriented to room and equipment. Call bell within reach. Patient's daughter is at bedside.

## 2015-01-19 NOTE — Consult Note (Signed)
NEURO HOSPITALIST CONSULT NOTE   Referring physician: Rai   Reason for Consult: AMS  HPI:                                                                                                                                          Donald Kaiser is an 81 y.o. male history of hypertension, diabetes, hyperlipidemia, coronary artery disease, status post CABG surgery, history of lumbar decompression surgery at age 27, chronic low back pain, radiating pain to right lower extremity  He had gradual bilateral visual loss since 2009 due to cataract, glaucoma, macular degenerations, diabetic retinopathy, now he can only he could only see the shadows of person, but could not tell details. 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 CT of head was obtained on October 5 which showed Bifrontal temporal predominant atrophy, periventricular small vessel disease.  Today he was noted to be normal at 0600 hours then suddenly was not following commands or talking.  No seizure activity was noted.  He was brought to ED where labs have been unremarkable repeat CT has been unremarkable.  Currently he is returning to his baseline.    Past Medical History  Diagnosis Date  . OSA (obstructive sleep apnea)   . GERD (gastroesophageal reflux disease)   . Diabetes mellitus type II   . Asthma   . DVT (deep venous thrombosis) ([facility])     chronic  . Post-phlebitic syndrome   .  Polycythemia vera(238.4)     Ennever  . COPD (chronic obstructive pulmonary disease) ([facility])   . Hypertension   . Tremors of nervous system   . Gait difficulty   . Memory loss     Past Surgical History  Procedure Laterality Date  . Tonsillectomy    . Artrial bypass      x 5  . Back surgery    . Broken leg surgery      Family History  Problem Relation Age of Onset  . Emphysema Sister   . Allergies Sister   . Asthma Sister   . Heart disease Brother   . Heart disease Brother   . Rheum arthritis Sister   . Rheum arthritis Mother   . Cancer Mother   . Cancer Sister   . Kidney failure Father   .  Stroke Sister      Social History:  reports that he has never smoked. He has never used smokeless tobacco. He reports that he does not drink alcohol or use illicit drugs.  No Known Allergies  MEDICATIONS:                                                                                                                     No current facility-administered medications for this encounter.   Current Outpatient Prescriptions  Medication Sig Dispense Refill  . albuterol-ipratropium (COMBIVENT) 18-103 MCG/ACT inhaler Inhale 1-2 puffs into the lungs every 4 (four) hours as needed for wheezing or shortness of breath.     [name] aspirin 81 MG tablet Take 81 mg by mouth daily.      [name] desonide (DESOWEN) 0.05 % cream Apply 1 application topically as needed.     . Ergocalciferol (VITAMIN D2) 2000 UNITS TABS Take 1 tablet by mouth daily.      . metoprolol (LOPRESSOR) 50 MG tablet Take 25 mg by mouth 2 (two) times daily.     . mometasone (ASMANEX 120 METERED DOSES) 220 MCG/INH inhaler Inhale 2 puffs into the lungs daily.     . mometasone (NASONEX) 50 MCG/ACT nasal spray Place 2 sprays into the nose daily. 17 g   . montelukast (SINGULAIR) 10 MG tablet Take 10 mg by mouth daily at 12 noon.     . Multiple Vitamins-Minerals (PRESERVISION AREDS PO) Take 1 capsule by mouth 2 (two) times daily.      .  nitroGLYCERIN (NITROSTAT) 0.4 MG SL tablet Place 0.4 mg under the tongue every 5 (five) minutes as needed.      [name] QUEtiapine (SEROQUEL) 25 MG tablet One to two tablets every night (Patient taking differently: Take 25-50 mg by mouth at bedtime. One to two tablets every night) 60 tablet 6  . simvastatin (ZOCOR) 40 MG tablet Take 40 mg by mouth at bedtime.      . solifenacin (VESICARE) 5 MG tablet Take 1 tablet (5 mg total) by mouth daily. 30 tablet 6  . traMADol (ULTRAM) 50 MG tablet Take 50 mg by mouth every 6 (six) hours as needed for moderate pain or severe pain.     [name] travoprost, benzalkonium, (TRAVATAN) 0.004 % ophthalmic solution Place 1 drop into both eyes daily.     . traZODone (DESYREL) 50 MG tablet TAKE 1 TABLET BY MOUTH EVERY DAY AT BEDTIME FOR SLEEP  5  . fluocinonide (LIDEX) 0.05 % external solution Apply topically as needed.     . furosemide (LASIX) 40 MG tablet Take 20 mg by mouth as needed.         ROS:  History obtained from the patient  General ROS: negative for - chills, fatigue, fever, night sweats, weight gain or weight loss Psychological ROS: negative for - behavioral disorder, hallucinations, memory difficulties, mood swings or suicidal ideation Ophthalmic ROS: negative for - blurry vision, double vision, eye pain or loss of vision ENT ROS: negative for - epistaxis, nasal discharge, oral lesions, sore throat, tinnitus or vertigo Allergy and Immunology ROS: negative for - hives or itchy/watery eyes Hematological and Lymphatic ROS: negative for - bleeding problems, bruising or swollen lymph nodes Endocrine ROS: negative for - galactorrhea, hair pattern changes, polydipsia/polyuria or temperature intolerance Respiratory ROS: negative for - cough, hemoptysis, shortness of breath or wheezing Cardiovascular ROS: negative for - chest pain, dyspnea  on exertion, edema or irregular heartbeat Gastrointestinal ROS: negative for - abdominal pain, diarrhea, hematemesis, nausea/vomiting or stool incontinence Genito-Urinary ROS: negative for - dysuria, hematuria, incontinence or urinary frequency/urgency Musculoskeletal ROS: negative for - joint swelling or muscular weakness Neurological ROS: as noted in HPI Dermatological ROS: negative for rash and skin lesion changes   Blood pressure 124/62, pulse 58, temperature 97.8 F (36.6 C), temperature source Rectal, resp. rate 13, SpO2 98 %.   Neurologic Examination:                                                                                                      HEENT-  Normocephalic, no lesions, without obvious abnormality.  Normal external eye and conjunctiva.  Normal TM's bilaterally.  Normal auditory canals and external ears. Normal external nose, mucus membranes and septum.  Normal pharynx. Cardiovascular- S1, S2 normal, pulses palpable throughout   Lungs- chest clear, no wheezing, rales, normal symmetric air entry Abdomen- normal findings: bowel sounds normal Extremities- no edema Lymph-no adenopathy palpable Musculoskeletal-no joint tenderness, deformity or swelling Skin-warm and dry, no hyperpigmentation, vitiligo, or suspicious lesions  Neurological Examination Mental Status: Alert, oriented, thought content appropriate.  Speech fluent without evidence of aphasia.  Able to follow 3 step commands without difficulty. Cranial Nerves: II: Discs flat bilaterally; Visual fields grossly decreased (old), pupils equal, round, reactive to light and accommodation III,IV, VI: ptosis not present, extra-ocular motions intact bilaterally V,VII: smile asymmetric on the right, facial light touch sensation normal bilaterally VIII: hearing normal bilaterally IX,X: uvula rises symmetrically XI: bilateral shoulder shrug XII: midline tongue extension Motor: Right : Upper extremity   5/5    Left:      Upper extremity   5/5  Lower extremity   5/5     Lower extremity   5/5 Tone and bulk:normal tone throughout; no atrophy noted Sensory: Pinprick and light touch intact throughout, bilaterally Deep Tendon Reflexes: 2+ and symmetric throughout Plantars: Right: downgoing   Left: downgoing Cerebellar: normal finger-to-nose,and normal heel-to-shin test Gait: not tested due to safety      Lab Results: Basic Metabolic Panel:  Recent Labs Lab 01/19/15 1133 01/19/15 1138  NA 139 138  K 3.8 3.7  CL 104 100*  CO2 27  --   GLUCOSE 239* 243*  BUN 25* 30*  CREATININE 1.33* 1.20  CALCIUM 8.9  --  Liver Function Tests:  Recent Labs Lab 01/19/15 1133  AST 34  ALT 25  ALKPHOS 76  BILITOT 0.9  PROT 5.2*  ALBUMIN 3.1*   No results for input(s): LIPASE, AMYLASE in the last 168 hours. No results for input(s): AMMONIA in the last 168 hours.  CBC:  Recent Labs Lab 01/19/15 1133 01/19/15 1138  WBC 6.7  --   NEUTROABS 4.7  --   HGB 14.2 14.6  HCT 42.1 43.0  MCV 85.9  --   PLT 98*  --     Cardiac Enzymes: No results for input(s): CKTOTAL, CKMB, CKMBINDEX, TROPONINI in the last 168 hours.  Lipid Panel: No results for input(s): CHOL, TRIG, HDL, CHOLHDL, VLDL, LDLCALC in the last 168 hours.  CBG:  Recent Labs Lab 01/19/15 1018  GLUCAP 233*    Microbiology: Results for orders placed or performed in visit on 03/09/10  [facility] - Smear     Status: None   Collection Time: 03/12/10  9:35 AM  Result Value Ref Range Status   Smear Result Smear Available  Final    Coagulation Studies: No results for input(s): LABPROT, INR in the last 72 hours.  Imaging: Ct Head Wo Contrast  01/19/2015  CLINICAL DATA:  Altered mental status, nonverbal EXAM: CT HEAD WITHOUT CONTRAST TECHNIQUE: Contiguous axial images were obtained from the base of the skull through the vertex without intravenous contrast. COMPARISON:  None. FINDINGS: There is no evidence of mass effect, midline  shift, or extra-axial fluid collections. There is no evidence of a space-occupying lesion or intracranial hemorrhage. There is no evidence of a cortical-based area of acute infarction. There is generalized cerebral atrophy. There is periventricular white matter low attenuation likely secondary to microangiopathy. The ventricles and sulci are appropriate for the patient's age. The basal cisterns are patent. Visualized portions of the orbits are unremarkable. Complete opacification of the right maxillary sinus. There is rightward deviation of the nasal septum. Cerebrovascular atherosclerotic calcifications are noted. The osseous structures are unremarkable. IMPRESSION: 1. No acute intracranial pathology. 2. Chronic microvascular disease and cerebral atrophy. Electronically Signed   By: Kathreen Devoid   On: 01/19/2015 11:32   Dg Chest Port 1 View  01/19/2015  CLINICAL DATA:  Altered mental status EXAM: PORTABLE CHEST 1 VIEW COMPARISON:  04/06/2011 FINDINGS: Normal heart size and stable aortic tortuosity post CABG. There is no edema, consolidation, effusion, or pneumothorax. Calcified granuloma in the right lung. No acute osseous finding. IMPRESSION: Stable exam.  No evidence of acute cardiopulmonary disease. Electronically Signed   By: Monte Fantasia M.D.   On: 01/19/2015 11:31       Assessment and plan per attending neurologist  Etta Quill PA-C [facility] [phone]  01/19/2015, 4:52 PM   Assessment/Plan: 81 YO male presenting to hospital after sudden onset of AMS. Patient has been experiencing 3 month rapid decline in gait and memory which he recently had seen Dr [name] yesterday for. Today noted to be non-responsive for transient period of time. Etiology unclear however cannot rule out seizure versus shower of emboli/stroke. Of note he recently was switched from Haldol to seroquel at bedtime, question if this may be playing a role in his change in mental status.  Recommend: 1) MRI  brain 2) EEG 3) Ammonia TSH 4) Hold standing night dose of seroquel  Jim Like, DO [facility] [phone]  If 7pm- 7am, please page neurology on call as listed in [place].

## 2015-01-19 NOTE — ED Notes (Addendum)
Pt given [place] sandwich pack. Explained to pt and family that due to mental status earlier and initial failed stroke swallow screen, pt had to have bedside swallow screen and needed orders from admitting MD for a diet in order to eat. Did not order meal tray for pt due to him having bed placement, and placement taken away multiple times.

## 2015-01-19 NOTE — Telephone Encounter (Signed)
Left message with information below - provided our number to call with any questions.

## 2015-01-19 NOTE — ED Notes (Addendum)
Pt brought via [facility] from home with altered mental status, last seen normal last night (01/18/15) at 1930. Responds to voice. Pt has hx of macular degeneration and is blind, HTN, chronic back pain, polycythemia, and is ambulatory at home with a walker. Pt seen this week by neurologist and old undiagnosed CVAs. En route, BP- 136/64, P-60s, CBG- 304

## 2015-01-20 ENCOUNTER — Inpatient Hospital Stay (HOSPITAL_COMMUNITY): Payer: Medicare Other

## 2015-01-20 ENCOUNTER — Encounter (HOSPITAL_COMMUNITY): Payer: Self-pay | Admitting: General Practice

## 2015-01-20 DIAGNOSIS — I82401 Acute embolism and thrombosis of unspecified deep veins of right lower extremity: Secondary | ICD-10-CM

## 2015-01-20 DIAGNOSIS — G9340 Encephalopathy, unspecified: Secondary | ICD-10-CM

## 2015-01-20 DIAGNOSIS — I639 Cerebral infarction, unspecified: Secondary | ICD-10-CM

## 2015-01-20 LAB — LIPID PANEL
Cholesterol: 90 mg/dL (ref 0–200)
HDL: 39 mg/dL — ABNORMAL LOW (ref >40)
LDL CALC: 43 mg/dL (ref 0–99)
Total CHOL/HDL Ratio: 2.3 RATIO
Triglycerides: 42 mg/dL (ref <150)
VLDL: 8 mg/dL (ref 0–40)

## 2015-01-20 LAB — GLUCOSE, CAPILLARY
GLUCOSE-CAPILLARY: 145 mg/dL — AB (ref 65–99)
GLUCOSE-CAPILLARY: 194 mg/dL — AB (ref 65–99)
Glucose-Capillary: 180 mg/dL — ABNORMAL HIGH (ref 65–99)
Glucose-Capillary: 231 mg/dL — ABNORMAL HIGH (ref 65–99)

## 2015-01-20 LAB — BASIC METABOLIC PANEL
Anion gap: 9 (ref 5–15)
BUN: 22 mg/dL — AB (ref 6–20)
CHLORIDE: 102 mmol/L (ref 101–111)
CO2: 28 mmol/L (ref 22–32)
CREATININE: 1.25 mg/dL — AB (ref 0.61–1.24)
Calcium: 8.7 mg/dL — ABNORMAL LOW (ref 8.9–10.3)
GFR calc Af Amer: 58 mL/min — ABNORMAL LOW (ref >60)
GFR calc non Af Amer: 50 mL/min — ABNORMAL LOW (ref >60)
Glucose, Bld: 185 mg/dL — ABNORMAL HIGH (ref 65–99)
Potassium: 3.6 mmol/L (ref 3.5–5.1)
Sodium: 139 mmol/L (ref 135–145)

## 2015-01-20 LAB — CBC
HEMATOCRIT: 39.3 % (ref 39.0–52.0)
Hemoglobin: 13.3 g/dL (ref 13.0–17.0)
MCH: 29.2 pg (ref 26.0–34.0)
MCHC: 33.8 g/dL (ref 30.0–36.0)
MCV: 86.2 fL (ref 78.0–100.0)
PLATELETS: 98 10*3/uL — AB (ref 150–400)
RBC: 4.56 MIL/uL (ref 4.22–5.81)
RDW: 13.8 % (ref 11.5–15.5)
WBC: 6 10*3/uL (ref 4.0–10.5)

## 2015-01-20 LAB — TROPONIN I: Troponin I: <0.03 ng/mL (ref <0.031)

## 2015-01-20 MED ORDER — HALOPERIDOL 1 MG PO TABS
1.0000 mg | ORAL_TABLET | Freq: Every evening | ORAL | Status: DC | PRN
  Administered 2015-01-20 (×2): 1 mg via ORAL
  Filled 2015-01-20 (×4): qty 1

## 2015-01-20 MED ORDER — ASPIRIN EC 81 MG PO TBEC
81.0000 mg | DELAYED_RELEASE_TABLET | Freq: Every day | ORAL | Status: DC
  Administered 2015-01-21 – 2015-01-23 (×3): 81 mg via ORAL
  Filled 2015-01-20 (×3): qty 1

## 2015-01-20 NOTE — Progress Notes (Deleted)
multiple long discussions with family members 2 daughters state we are "kicking out" their father and are asking to appeal to medicare 1 daughter in particular became  verbally abusive to this examiner and stated that "we are not treating him right we are not giving him the right medications and we are changing things on him"  That daughter was requested to comport herself in a becoming way  Patient has been suitably stabilized in hospital, has not had any further recurrence of the episode that he had previously and at their request I spoke to neurology who came back to see the patient and while we all accept the fact that the patient needs skilled services, patient does not need inpatient hospital stay I've asked case management to come back and talk to the family regarding what options may be available  From my standpoint patient is medically stable for discharge and I have gone over the medication list exhaustively on discharge with the other daughter in the room to make sure she understands what her father should be on as there are no specific changes from prior medications  [name], MD [facility] [phone]

## 2015-01-20 NOTE — Progress Notes (Signed)
Subjective: patient is awake and eating breakfast.  Follows commands. No further episodes.   Objective: Current vital signs: BP 110/52 mmHg  Pulse 53  Temp(Src) 97.3 F (36.3 C) (Oral)  Resp 16  Ht 5\' 8"  (1.727 m)  Wt 82.3 kg (181 lb 7 oz)  BMI 27.59 kg/m2  SpO2 98% Vital signs in last 24 hours: Temp:  [97.3 F (36.3 C)-98.4 F (36.9 C)] 97.3 F (36.3 C) (10/14 0755) Pulse Rate:  [52-63] 53 (10/14 0755) Resp:  [10-25] 16 (10/14 0755) BP: (102-160)/(42-95) 110/52 mmHg (10/14 0755) SpO2:  [94 %-99 %] 98 % (10/14 0755) Weight:  [82.3 kg (181 lb 7 oz)] 82.3 kg (181 lb 7 oz) (10/13 2056)  Intake/Output from previous day: 10/13 0701 - 10/14 0700 In: 240 [P.O.:240] Out: 200 [Urine:200] Intake/Output this shift:   Nutritional status: Diet Carb Modified Fluid consistency:: Thin; Room service appropriate?: Yes  Neurologic Exam:  Mental Status: Alert, oriented, thought content appropriate. Speech fluent without evidence of aphasia. Able to follow 3 step commands without difficulty. Cranial Nerves: II: Discs flat bilaterally; Visual fields grossly decreased (old), pupils equal, round, reactive to light and accommodation III,IV, VI: ptosis not present, extra-ocular motions intact bilaterally V,VII: smile asymmetric on the right, facial light touch sensation normal bilaterally VIII: hearing normal bilaterally IX,X: uvula rises symmetrically XI: bilateral shoulder shrug XII: midline tongue extension Motor: Right :Upper extremity 5/5Left: Upper extremity 5/5 Lower extremity 5/5Lower extremity 5/5 Tone and bulk:normal tone throughout; no atrophy noted Sensory: Pinprick and light touch intact throughout, bilaterally   Lab Results: Basic Metabolic Panel:  Recent Labs Lab 01/19/15 1133 01/19/15 1138 01/20/15 0444  NA 139 138 139  K 3.8 3.7 3.6  CL 104 100* 102   CO2 27  --  28  GLUCOSE 239* 243* 185*  BUN 25* 30* 22*  CREATININE 1.33* 1.20 1.25*  CALCIUM 8.9  --  8.7*    Liver Function Tests:  Recent Labs Lab 01/19/15 1133  AST 34  ALT 25  ALKPHOS 76  BILITOT 0.9  PROT 5.2*  ALBUMIN 3.1*   No results for input(s): LIPASE, AMYLASE in the last 168 hours. No results for input(s): AMMONIA in the last 168 hours.  CBC:  Recent Labs Lab 01/19/15 1133 01/19/15 1138 01/20/15 0444  WBC 6.7  --  6.0  NEUTROABS 4.7  --   --   HGB 14.2 14.6 13.3  HCT 42.1 43.0 39.3  MCV 85.9  --  86.2  PLT 98*  --  98*    Cardiac Enzymes:  Recent Labs Lab 01/19/15 1744 01/20/15 0444  TROPONINI <0.03 <0.03    Lipid Panel:  Recent Labs Lab 01/20/15 0444  CHOL 90  TRIG 42  HDL 39*  CHOLHDL 2.3  VLDL 8  LDLCALC 43    CBG:  Recent Labs Lab 01/19/15 1018 01/19/15 1825 01/19/15 2102 01/20/15 0753  GLUCAP 233* 254* 345* 145*    Microbiology: Results for orders placed or performed in visit on 03/09/10  [facility] - Smear     Status: None   Collection Time: 03/12/10  9:35 AM  Result Value Ref Range Status   Smear Result Smear Available  Final    Coagulation Studies: No results for input(s): LABPROT, INR in the last 72 hours.  Imaging: Ct Head Wo Contrast  01/19/2015  CLINICAL DATA:  Altered mental status, nonverbal EXAM: CT HEAD WITHOUT CONTRAST TECHNIQUE: Contiguous axial images were obtained from the base of the skull through the vertex without intravenous  contrast. COMPARISON:  None. FINDINGS: There is no evidence of mass effect, midline shift, or extra-axial fluid collections. There is no evidence of a space-occupying lesion or intracranial hemorrhage. There is no evidence of a cortical-based area of acute infarction. There is generalized cerebral atrophy. There is periventricular white matter low attenuation likely secondary to microangiopathy. The ventricles and sulci are appropriate for the patient's age. The basal  cisterns are patent. Visualized portions of the orbits are unremarkable. Complete opacification of the right maxillary sinus. There is rightward deviation of the nasal septum. Cerebrovascular atherosclerotic calcifications are noted. The osseous structures are unremarkable. IMPRESSION: 1. No acute intracranial pathology. 2. Chronic microvascular disease and cerebral atrophy. Electronically Signed   By: Kathreen Devoid   On: 01/19/2015 11:32   Mr Brain Wo Contrast  01/19/2015  CLINICAL DATA:  Initial evaluation for acute altered mental status, weakness, right-sided facial droop. Evaluate for stroke. EXAM: MRI HEAD WITHOUT CONTRAST MRA HEAD WITHOUT CONTRAST TECHNIQUE: Multiplanar, multiecho pulse sequences of the brain and surrounding structures were obtained without intravenous contrast. Angiographic images of the head were obtained using MRA technique without contrast. COMPARISON:  Prior CT from earlier the same day. FINDINGS: MRI HEAD FINDINGS Diffuse prominence of the CSF containing spaces is compatible with generalized age-related cerebral atrophy. No significant white matter disease for patient age. No abnormal foci of restricted diffusion to suggest acute intracranial infarct. Gray-white matter differentiation maintained. Normal intravascular flow voids are maintained. No acute or chronic intracranial hemorrhage. No mass lesion, midline shift, or mass effect. Mild ventricular prominence related to global parenchymal volume loss without hydrocephalus. No extra-axial fluid collection. Craniocervical junction within normal limits. Pituitary gland normal. No acute abnormality about the orbits. Sequela of prior bilateral lens extraction noted. Right maxillary sinus is small and completely opacified, likely chronic in nature. Mild scattered mucosal thickening within the ethmoidal air cells. Trace opacity within the right mastoid air cells. Bone marrow signal intensity within normal limits. T2 hyperintense lesion  within the right parietal scalp noted, likely benign. No scalp soft tissue abnormality. MRA HEAD FINDINGS ANTERIOR CIRCULATION: Eighth visualized distal cervical segments of the internal carotid arteries are patent with antegrade flow. Petrous, cavernous, and supra clinoid segments well opacified. A1 segments widely patent. Anterior communicating artery normal. Anterior cerebral arteries opacified to their distal aspects. M1 segments patent without focal stenosis or occlusion. MCA bifurcations normal. Distal MCA branches fairly symmetric bilaterally. Minimal distal small vessel atheromatous changes within the MCA branches bilaterally. POSTERIOR CIRCULATION: Vertebral arteries are patent to the vertebrobasilar junction. Posterior inferior cerebral arteries are patent proximally. Basilar artery demonstrates mild multi focal atheromatous irregularity with narrowing. No high-grade stenosis. Basilar artery is opacified to its distal aspect. Superior cerebral arteries opacified bilaterally. Both posterior cerebral arteries arise from the basilar artery and are opacified to the distal aspects. Mild atheromatous irregularity within the PCAs bilaterally. No aneurysm. Apparent focal blush of high signal intensity will in the region of the right middle cerebellar peduncle favored to be artifact in nature with no definite underlying vascular malformation or other acute abnormality. IMPRESSION: MRI HEAD IMPRESSION: 1. No acute intracranial infarct or other process identified. 2. Generalized age-related cerebral atrophy. 3. 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 MRA HEAD IMPRESSION: 1. No acute large or proximal arterial branch occlusion within the intracranial circulation. No focal severe or correctable stenosis. 2. Mild atheromatous irregularity within the proximal basilar artery without high-grade stenosis. 3. Mild distal small branch atheromatous irregularity within the MCA and PCA branches bilaterally.  Electronically Signed   By: Jeannine Boga M.D.   On: 01/19/2015 23:53   Dg Chest Port 1 View  01/19/2015  CLINICAL DATA:  Altered mental status EXAM: PORTABLE CHEST 1 VIEW COMPARISON:  04/06/2011 FINDINGS: Normal heart size and stable aortic tortuosity post CABG. There is no edema, consolidation, effusion, or pneumothorax. Calcified granuloma in the right lung. No acute osseous finding. IMPRESSION: Stable exam.  No evidence of acute cardiopulmonary disease. Electronically Signed   By: Monte Fantasia M.D.   On: 01/19/2015 11:31   Mr Jodene Nam Head/brain Wo Cm  01/19/2015  CLINICAL DATA:  Initial evaluation for acute altered mental status, weakness, right-sided facial droop. Evaluate for stroke. EXAM: MRI HEAD WITHOUT CONTRAST MRA HEAD WITHOUT CONTRAST TECHNIQUE: Multiplanar, multiecho pulse sequences of the brain and surrounding structures were obtained without intravenous contrast. Angiographic images of the head were obtained using MRA technique without contrast. COMPARISON:  Prior CT from earlier the same day. FINDINGS: MRI HEAD FINDINGS Diffuse prominence of the CSF containing spaces is compatible with generalized age-related cerebral atrophy. No significant white matter disease for patient age. No abnormal foci of restricted diffusion to suggest acute intracranial infarct. Gray-white matter differentiation maintained. Normal intravascular flow voids are maintained. No acute or chronic intracranial hemorrhage. No mass lesion, midline shift, or mass effect. Mild ventricular prominence related to global parenchymal volume loss without hydrocephalus. No extra-axial fluid collection. Craniocervical junction within normal limits. Pituitary gland normal. No acute abnormality about the orbits. Sequela of prior bilateral lens extraction noted. Right maxillary sinus is small and completely opacified, likely chronic in nature. Mild scattered mucosal thickening within the ethmoidal air cells. Trace opacity within  the right mastoid air cells. Bone marrow signal intensity within normal limits. T2 hyperintense lesion within the right parietal scalp noted, likely benign. No scalp soft tissue abnormality. MRA HEAD FINDINGS ANTERIOR CIRCULATION: Eighth visualized distal cervical segments of the internal carotid arteries are patent with antegrade flow. Petrous, cavernous, and supra clinoid segments well opacified. A1 segments widely patent. Anterior communicating artery normal. Anterior cerebral arteries opacified to their distal aspects. M1 segments patent without focal stenosis or occlusion. MCA bifurcations normal. Distal MCA branches fairly symmetric bilaterally. Minimal distal small vessel atheromatous changes within the MCA branches bilaterally. POSTERIOR CIRCULATION: Vertebral arteries are patent to the vertebrobasilar junction. Posterior inferior cerebral arteries are patent proximally. Basilar artery demonstrates mild multi focal atheromatous irregularity with narrowing. No high-grade stenosis. Basilar artery is opacified to its distal aspect. Superior cerebral arteries opacified bilaterally. Both posterior cerebral arteries arise from the basilar artery and are opacified to the distal aspects. Mild atheromatous irregularity within the PCAs bilaterally. No aneurysm. Apparent focal blush of high signal intensity will in the region of the right middle cerebellar peduncle favored to be artifact in nature with no definite underlying vascular malformation or other acute abnormality. IMPRESSION: MRI HEAD IMPRESSION: 1. No acute intracranial infarct or other process identified. 2. Generalized age-related cerebral atrophy. 3. 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 MRA HEAD IMPRESSION: 1. No acute large or proximal arterial branch occlusion within the intracranial circulation. No focal severe or correctable stenosis. 2. Mild atheromatous irregularity within the proximal basilar artery without high-grade stenosis.  3. Mild distal small branch atheromatous irregularity within the MCA and PCA branches bilaterally. Electronically Signed   By: Jeannine Boga M.D.   On: 01/19/2015 23:53    Medications:  Scheduled: . aspirin  300 mg Rectal Daily   Or  . aspirin  325 mg Oral Daily  .  budesonide  2 mL Inhalation BID  . enoxaparin (LOVENOX) injection  40 mg Subcutaneous Q24H  . fluticasone  1 spray Each Nare Daily  . insulin aspart  0-5 Units Subcutaneous QHS  . insulin aspart  0-9 Units Subcutaneous TID WC  . memantine  5 mg Oral QHS  . montelukast  10 mg Oral Daily  . multivitamin-lutein  1 capsule Oral BID  . Travoprost (BAK Free)  1 drop Both Eyes Daily  . traZODone  50 mg Oral QHS    Assessment/Plan:  81 YO male presenting to hospital after sudden onset of AMS. Patient has been experiencing 3 month rapid decline in gait and memory which he recently had seen Dr [name]. MRI brain and MRA show no causes for event.  TSH, B12 WNL.  Awaiting EEG. IF EEG is normal would recommend PT and have patietn follow up with Dr. [name] as out patient.   Etta Quill PA-C [facility] [phone]  01/20/2015, 9:13 AM   Patient seen and evaluated. Agree with above assessment and plan. MRI and metabolic workup unremarkable. Awaiting EEG. If unremarkable then no further inpatient workup indicated at this time. Can follow up with outpatient neurologist.   Jim Like, DO [facility] [phone]  If 7pm- 7am, please page neurology on call as listed in [place].

## 2015-01-20 NOTE — Progress Notes (Signed)
Utilization review completed. Shakerra Red, RN, BSN. 

## 2015-01-20 NOTE — Progress Notes (Signed)
Family called RN to room and stated pt. was agitatied & crying because he can't get what he needs.  They feel they can't safely care for him at home with him being unable to walk and get up and is found on the floor at times.  Dr. Alinda Dooms text paged to inform of these concerned.  Alphonzo Lemmings, RN

## 2015-01-20 NOTE — Evaluation (Signed)
Physical Therapy Evaluation Patient Details Name: Donald Kaiser MRN: [id number] DOB: 05/01/1926 Today's Date: 01/20/2015   History of Present Illness  Patient is a 81 year old male with blindness, hypertension, chronic back pain, polycythemia vera, urinary incontinence, prior TIA 2 presented to ED with altered mental status, slurred speech and facial droop. MRI negative for acute process.   Clinical Impression  Patient presents with decreased mobility and currently unable to be managed safely in the home even with 24 hour care.  Feel he may benefit from skilled PT in the acute setting and follow up PT in SNF setting.  Family in the room and very supportive, but even with hired help have become unable to care for patient and are agreeable to placement.    Follow Up Recommendations SNF    Equipment Recommendations  None recommended by PT    Recommendations for Other Services       Precautions / Restrictions Precautions Precautions: Fall Precaution Comments: falls about 2 times per week per daughter, sometimes OOB      Mobility  Bed Mobility Overal bed mobility: Needs Assistance Bed Mobility: Rolling;Sidelying to Sit;Sit to Sidelying Rolling: Max assist Sidelying to sit: Max assist     Sit to sidelying: Max assist General bed mobility comments: assisted to flex knees and to reach to rail as well as to move into sidelying with pad under pt, then to sit assisted legs off bed and encouraged pt to push up with rail and assisted with moving hands down rail and reaching across body; to side no rail assist to bring elbow to bed and lift legs into bed/  Transfers Overall transfer level: Needs assistance Equipment used: Standard walker;Rolling walker (2 wheeled) Transfers: Sit to/from Stand Sit to Stand: Mod assist;From elevated surface         General transfer comment: raised height of bed, assisted to stand with lifting assist pt with hands on walker, initial posterior bias,  assisted to move walker forward to lean over feet  Ambulation/Gait Ambulation/Gait assistance: Mod assist Ambulation Distance (Feet): 6 Feet (3' fwd, 3' back) Assistive device: Rolling walker (2 wheeled) Gait Pattern/deviations: Step-to pattern;Decreased dorsiflexion - right;Decreased dorsiflexion - left;Decreased stride length;Decreased stance time - right;Decreased step length - left;Antalgic     General Gait Details: difficulty and increased time for weight shift to right, pt fatigued so stepped back to bed with assist for stepping back and moving walker back  Stairs            Wheelchair Mobility    Modified Rankin (Stroke Patients Only)       Balance   Sitting-balance support: Feet supported;Single extremity supported Sitting balance-Leahy Scale: Poor Sitting balance - Comments: sat with supervision, no noted LOB held ralining Postural control: Posterior lean Standing balance support: Bilateral upper extremity supported Standing balance-Leahy Scale: Poor Standing balance comment: mod assist with UE support on walker                             Pertinent Vitals/Pain Pain Assessment: 0-10 Pain Score: 7  Pain Location: right LE (6/10 in shoulder) Pain Descriptors / Indicators: Aching Pain Intervention(s): Limited activity within patient's tolerance;Monitored during session;Repositioned    Home Living Family/patient expects to be discharged to:: Private residence Living Arrangements: Spouse/significant other;Children Available Help at Discharge: Family;Available 24 hours/day;Personal care attendant (2 x/week hired caregiver for bathing) Type of Home: House Home Access: Stairs to enter;Ramped entrance     [place]:  One level Home Equipment: [place] - 4 wheels;Bedside commode;Wheelchair - manual;Hospital bed      Prior Function Level of Independence: Needs assistance   Gait / Transfers Assistance Needed: assist with walker for ambulation; assist  for in/out of bed, use of lift chair at home           Hand Dominance        Extremity/Trunk Assessment               Lower Extremity Assessment: RLE deficits/detail;LLE deficits/detail RLE Deficits / Details: AAROM WFL but painful in hip, except tight heel cords, strength hip flexion 4-/5, knee extension 4/5, ankle DF 2+/5 LLE Deficits / Details: AAROM WFL except tight heel cords, strength hip flexion 4-/5, knee extension 4/5, ankle DF 1/5  Cervical / Trunk Assessment: Kyphotic  Communication   Communication: No difficulties  Cognition Arousal/Alertness: Awake/alert Behavior During Therapy: Flat affect Overall Cognitive Status: Impaired/Different from baseline Area of Impairment: Orientation;Memory;Following commands;Problem solving Orientation Level: Disoriented to;Time;Situation     Following Commands: Follows one step commands inconsistently;Follows one step commands with increased time     Problem Solving: Slow processing;Decreased initiation;Difficulty sequencing;Requires verbal cues;Requires tactile cues General Comments: waiting about 20-30 seconds after commands with still no attempts so manual assist needed    General Comments      Exercises        Assessment/Plan    PT Assessment Patient needs continued PT services  PT Diagnosis Generalized weakness   PT Problem List Decreased strength;Decreased activity tolerance;Decreased balance;Decreased mobility;Decreased coordination;Decreased cognition;Pain  PT Treatment Interventions DME instruction;Balance training;Gait training;Functional mobility training;Patient/family education;Therapeutic exercise;Therapeutic activities   PT Goals (Current goals can be found in the Care Plan section) Acute Rehab PT Goals Patient Stated Goal: To go to rehab PT Goal Formulation: With patient/family Time For Goal Achievement: 02/03/15 Potential to Achieve Goals: Fair    Frequency Min 3X/week   Barriers to discharge         Co-evaluation               End of Session Equipment Utilized During Treatment: Gait belt Activity Tolerance: Patient limited by fatigue Patient left: in bed;with call bell/phone within reach           Time: 1410-1445 PT Time Calculation (min) (ACUTE ONLY): 35 min   Charges:   PT Evaluation $Initial PT Evaluation Tier I: 1 Procedure PT Treatments $Therapeutic Activity: 8-22 mins   PT G Codes:        Donald Kaiser,Donald Kaiser 02-14-2015, 3:06 PM  Magda Kiel, [place] Feb 14, 2015

## 2015-01-20 NOTE — Progress Notes (Signed)
Donald Kaiser FMM:[id number] DOB: 11/17/1926 DOA: 01/19/2015 PCP: [name], MD  Brief narrative: 81 y/o ? Legally blind since 2009 follows Dr [name] Neurology as OP-noted to have hallucinations, gait disorder, and confusion recently switched from Haldol to Seroquel 25 mg 1-2 qhs tyII DM + periph Neuropathy H/o polycythemia vera follwed by Dr. Ricky Stabs with intermittent phlebotomies CABG x 5 1996 COPD BPH Degen disc disease ECOG status 3  Past medical history-As per Problem list Chart reviewed as below-   Consultants:  neuro  Procedures:  MRI/Ct/Carotids  Antibiotics:     Subjective  Confabulating Can tell me where he is and year but then talking a little bit more confused No sob nor cp Wife and daughter in room report difficulty managing his care at home and frequent falls   Objective    Interim History:   Telemetry:    Objective: Filed Vitals:   01/20/15 0547 01/20/15 0755 01/20/15 1023 01/20/15 1505  BP: 108/54 110/52 111/54 123/54  Pulse: 52 53 60 56  Temp: 98.1 F (36.7 C) 97.3 F (36.3 C) 98.2 F (36.8 C) 98.2 F (36.8 C)  TempSrc:  Oral Oral Oral  Resp: 18 16 16 16   Height:      Weight:      SpO2: 97% 98% 100% 96%    Intake/Output Summary (Last 24 hours) at 01/20/15 1558 Last data filed at 01/20/15 1108  Gross per 24 hour  Intake    480 ml  Output    100 ml  Net    380 ml    Exam:  General: eomi ncat  Cardiovascular: s1 s2 no m/r/g Respiratory: clear no added sound Abdomen: soft nt nd no rebound Skin no LE edema Neuro intact moving all 4 limbs  Data Reviewed: Basic Metabolic Panel:  Recent Labs Lab 01/19/15 1133 01/19/15 1138 01/20/15 0444  NA 139 138 139  K 3.8 3.7 3.6  CL 104 100* 102  CO2 27  --  28  GLUCOSE 239* 243* 185*  BUN 25* 30* 22*  CREATININE 1.33* 1.20 1.25*  CALCIUM 8.9  --  8.7*   Liver Function Tests:  Recent Labs Lab 01/19/15 1133  AST 34  ALT 25  ALKPHOS 76  BILITOT 0.9  PROT 5.2*    ALBUMIN 3.1*   No results for input(s): LIPASE, AMYLASE in the last 168 hours. No results for input(s): AMMONIA in the last 168 hours. CBC:  Recent Labs Lab 01/19/15 1133 01/19/15 1138 01/20/15 0444  WBC 6.7  --  6.0  NEUTROABS 4.7  --   --   HGB 14.2 14.6 13.3  HCT 42.1 43.0 39.3  MCV 85.9  --  86.2  PLT 98*  --  98*   Cardiac Enzymes:  Recent Labs Lab 01/19/15 1744 01/20/15 0444  TROPONINI <0.03 <0.03   BNP: Invalid input(s): POCBNP CBG:  Recent Labs Lab 01/19/15 1018 01/19/15 1825 01/19/15 2102 01/20/15 0753 01/20/15 1219  GLUCAP 233* 254* 345* 145* 180*    No results found for this or any previous visit (from the past 240 hour(s)).   Studies:              All Imaging reviewed and is as per above notation   Scheduled Meds: . aspirin  300 mg Rectal Daily   Or  . aspirin  325 mg Oral Daily  . budesonide  2 mL Inhalation BID  . enoxaparin (LOVENOX) injection  40 mg Subcutaneous Q24H  . fluticasone  1 spray Each  Nare Daily  . insulin aspart  0-5 Units Subcutaneous QHS  . insulin aspart  0-9 Units Subcutaneous TID WC  . memantine  5 mg Oral QHS  . montelukast  10 mg Oral Daily  . multivitamin-lutein  1 capsule Oral BID  . Travoprost (BAK Free)  1 drop Both Eyes Daily  . traZODone  50 mg Oral QHS   Continuous Infusions: . sodium chloride Stopped (01/20/15 0002)     Assessment/Plan:  1. Toxic met encephalopathy-cause unclear-?Haldol, Lack of sleep.  NOt infectious and patient now back at baseline NO CONCERN FOR CVA-Echo being performed for ?Abn EKG.  Needs follow up as OP at Dr. [name] office- TSH, B12 wnl defer changing any current psycho/neurotropic meds to Dr. [name] as OP-continue Memantine 5 qhs.  Would Not add trazadone unless okayed by OP Neurology 2. Polycythemia Vera-OP follow up Dr. [name] as required 3. H/o CABG x 5 1996-stable and quiescent-Cont Asa 81 daily-not on ACE, BB. 4. ?RLE swelling- doppler bilat LE doppler neg. 5. COPD/Asthma-cont  Budesonide 2 ml bid, Montelukast 10 od-no wheeze. 6. Abn EKG-echo pending.  Trop wnl 7. Adult FTt and mod P-Em-Nutrition input appreciated.  Therapy rec's SNF-S/w to clarify if meets criteria 8. Sacral decub-WOC nurse consult pending  Code Status: full Family Communication: inpatient Disposition Plan:  ?home c HH.  Might not meed Skilled care    [name], MD  [facility] Pager [phone] 01/20/2015, 3:58 PM    LOS: 1 day

## 2015-01-20 NOTE — Progress Notes (Signed)
Report given to oncoming RN.

## 2015-01-20 NOTE — Progress Notes (Signed)
This encounter was created in error - please disregard.

## 2015-01-20 NOTE — Procedures (Signed)
ELECTROENCEPHALOGRAM REPORT  Date of Study: 01/20/2015  Patient's Name: Donald Kaiser MRN: [id number] Date of Birth: 07/23/1926  Referring Provider: Dr. Etta Quill  Clinical History: This is an 81 year old man with altered mental status.   Medications: aspirin tablet 325 mg budesonide (PULMICORT) nebulizer solution 0.25 mg enoxaparin (LOVENOX) injection 40 mg fluticasone (FLONASE) 50 MCG/ACT nasal spray 1 spray memantine (NAMENDA) tablet 5 mg montelukast (SINGULAIR) tablet 10 mg senna-docusate (Senokot-S) tablet 1 tablet traZODone (DESYREL) tablet 50 mg  Technical Summary: A multichannel digital EEG recording measured by the international 10-20 system with electrodes applied with paste and impedances below 5000 ohms performed as portable with EKG monitoring in an awake and asleep patient.  Hyperventilation and photic stimulation were not performed.  The digital EEG was referentially recorded, reformatted, and digitally filtered in a variety of bipolar and referential montages for optimal display.   Description: The patient is awake and asleep during the recording.  There is no clear posterior dominant rhythm. The waking background consists of a moderate amount of diffuse 5-6 Hz theta slowing. During drowsiness and sleep, there is an increase in theta and delta slowing of the background, with poorly formed vertex waves and sleep spindles seen.  Hyperventilation and photic stimulation were not performed.  There were no epileptiform discharges or electrographic seizures seen.    EKG lead showed sinus bradycardia.  Impression: This awake and asleep EEG is abnormal due to mild to moderate diffuse slowing of the waking background.  Clinical Correlation of the above findings indicates diffuse cerebral dysfunction that is non-specific in etiology and can be seen with hypoxic/ischemic injury, toxic/metabolic encephalopathies, or medication effect.  The absence of epileptiform discharges  does not rule out a clinical diagnosis of epilepsy.  Clinical correlation is advised.   Ellouise Newer, M.D.

## 2015-01-20 NOTE — Progress Notes (Signed)
VASCULAR LAB PRELIMINARY  PRELIMINARY  PRELIMINARY  PRELIMINARY  Right lower extremity venous duplex and carotid duplex completed.    Preliminary report:    Venous:  Right:  No evidence of DVT, superficial thrombosis, or Baker's cyst.  Carotid:  Bilateral:  1-39% ICA stenosis.  Vertebral artery flow is antegrade.      Marka Treloar, RVT 01/20/2015, 12:12 PM

## 2015-01-20 NOTE — Consult Note (Signed)
   [facility] CM Inpatient Consult   01/20/2015  Donald Kaiser 09-Dec-1926 [id number] Referral received and patient was working with physical therapy.  Will follow for any [facility] Care Management needs.  For questions, please contact: Natividad Brood, RN BSN [facility] Liaison  [phone] business mobile phone

## 2015-01-20 NOTE — Progress Notes (Signed)
Patient has continuous NS fluid order. IV pump keeps peeping every time patient moves his hand due to position of IV access on left wrist. Patient requests IV fluid to be stopped so that he could sleep and prefers new IV in the morning if fluid stills needed. RN stopped IV fluid per patient's request.

## 2015-01-20 NOTE — Progress Notes (Signed)
Routine EEG completed, results pending. 

## 2015-01-21 ENCOUNTER — Inpatient Hospital Stay (HOSPITAL_COMMUNITY): Payer: Medicare Other

## 2015-01-21 LAB — BASIC METABOLIC PANEL
ANION GAP: 10 (ref 5–15)
BUN: 19 mg/dL (ref 6–20)
CALCIUM: 8.8 mg/dL — AB (ref 8.9–10.3)
CHLORIDE: 102 mmol/L (ref 101–111)
CO2: 26 mmol/L (ref 22–32)
CREATININE: 1.27 mg/dL — AB (ref 0.61–1.24)
GFR calc non Af Amer: 49 mL/min — ABNORMAL LOW (ref >60)
GFR, EST AFRICAN AMERICAN: 57 mL/min — AB (ref >60)
Glucose, Bld: 163 mg/dL — ABNORMAL HIGH (ref 65–99)
Potassium: 3.7 mmol/L (ref 3.5–5.1)
SODIUM: 138 mmol/L (ref 135–145)

## 2015-01-21 LAB — GLUCOSE, CAPILLARY
GLUCOSE-CAPILLARY: 162 mg/dL — AB (ref 65–99)
GLUCOSE-CAPILLARY: 215 mg/dL — AB (ref 65–99)
GLUCOSE-CAPILLARY: 156 mg/dL — AB (ref 65–99)
Glucose-Capillary: 189 mg/dL — ABNORMAL HIGH (ref 65–99)

## 2015-01-21 LAB — HEMOGLOBIN A1C
HEMOGLOBIN A1C: 9.5 % — AB (ref 4.8–5.6)
MEAN PLASMA GLUCOSE: 226 mg/dL

## 2015-01-21 MED ORDER — FUROSEMIDE 40 MG PO TABS: 20.0000 mg | ORAL_TABLET | Freq: Every day | ORAL | Status: AC

## 2015-01-21 MED ORDER — SIMVASTATIN 40 MG PO TABS: 40.0000 mg | ORAL_TABLET | Freq: Every day | ORAL | Status: AC

## 2015-01-21 MED ORDER — FLUOCINONIDE 0.05 % EX SOLN: 1.0000 "application " | Freq: Two times a day (BID) | CUTANEOUS | Status: AC

## 2015-01-21 MED ORDER — HALOPERIDOL 2 MG PO TABS
2.0000 mg | ORAL_TABLET | Freq: Every evening | ORAL | Status: DC | PRN
  Filled 2015-01-21: qty 1

## 2015-01-21 MED ORDER — TRAZODONE HCL 50 MG PO TABS
50.0000 mg | ORAL_TABLET | Freq: Every day | ORAL | Status: DC
  Administered 2015-01-21 – 2015-01-22 (×2): 50 mg via ORAL
  Filled 2015-01-21 (×2): qty 1

## 2015-01-21 MED ORDER — HALOPERIDOL 2 MG PO TABS: 2.0000 mg | ORAL_TABLET | Freq: Two times a day (BID) | ORAL | Status: DC

## 2015-01-21 MED ORDER — QUETIAPINE FUMARATE 25 MG PO TABS: 12.5000 mg | ORAL_TABLET | Freq: Every day | ORAL | Status: DC

## 2015-01-21 NOTE — Care Management Note (Addendum)
Case Management Note  Patient Details  Name: Donald Kaiser MRN: [id number] Date of Birth: [birth year]  Subjective/Objective:                   Confusion with altered mental status Action/Plan: Discharge planning  Expected Discharge Date:  01/22/15               Expected Discharge Plan:  [place]  In-House Referral:     Discharge planning Services  CM Consult  Post Acute Care Choice:    Choice offered to:  Adult Children, Spouse  DME Arranged:    DME Agency:     HH Arranged:  RN, PT, OT, Nurse's Aide [place] Agency:  [facility]  Status of Service:  In process, will continue to follow  Medicare Important Message Given:    Date Medicare IM Given:    Medicare IM give by:    Date Additional Medicare IM Given:    Additional Medicare Important Message give by:     If discussed at [place] of Stay Meetings, dates discussed:    Additional Comments: CM met with pt and family in room to discuss [facility] needs.  Family is distraught over pt being discharged home and wanted pt to go to [facility].  CM had received an earlier call from [place] pt not elligible for SNF paid by insurance bc of LOS.  Pt is active with [facility] and CM offered to call CSW to arrange for non-emergency transport home.  Family of pt state they are exercising pt's right to Appeal and ask for copy of IM.  CM gave family IM and daughter is calling [facility]. CM notified MD and will monitor for outcome. Dellie Catholic, RN 01/21/2015, 3:16 PM

## 2015-01-21 NOTE — Progress Notes (Signed)
  Echocardiogram 2D Echocardiogram has been performed.  Donald Kaiser 01/21/2015, 10:15 AM

## 2015-01-21 NOTE — Progress Notes (Addendum)
Donald Kaiser [id number] DOB: Jun 22, 1926 DOA: 01/19/2015 PCP: Donald Reel, MD  Brief narrative: 81 y/o ? Legally blind since 2009 follows [facility] as OP-noted to have hallucinations, gait disorder, and confusion recently switched from Haldol to Seroquel 25 mg 1-2 qhs tyII DM + periph Neuropathy H/o polycythemia vera follwed by Donald. Marin Kaiser with intermittent phlebotomies CABG x 5 1996 COPD BPH Degen disc disease ECOG status 3  Past medical history-As per Problem list Chart reviewed as below-   Consultants:  neuro  Procedures:  MRI/Ct/Carotids  Antibiotics:     Subjective   Somewhat confused and sleepy Family members at bedside No other complaints from nursing   Objective    Interim History:   Telemetry:    Objective: Filed Vitals:   01/20/15 1505 01/20/15 1935 01/20/15 2107 01/21/15 0523  BP: 123/54  136/58 159/74  Pulse: 56  55 60  Temp: 98.2 F (36.8 C)  98.9 F (37.2 C) 97.8 F (36.6 C)  TempSrc: Oral  Oral Oral  Resp: 16  18   Height:      Weight:      SpO2: 96% 98% 97% 100%    Intake/Output Summary (Last 24 hours) at 01/21/15 1335 Last data filed at 01/21/15 0525  Gross per 24 hour  Intake    840 ml  Output    200 ml  Net    640 ml    Exam:  General: eomi ncat  Cardiovascular: s1 s2 no m/r/g Respiratory: clear no added sound Abdomen: soft nt nd no rebound Skin no LE edema Neuro intact moving all 4 limbs Stage 1-2 sacral decubitus sacrum  Data Reviewed: Basic Metabolic Panel:  Recent Labs Lab 01/19/15 1133 01/19/15 1138 01/20/15 0444 01/21/15 0452  NA 139 138 139 138  K 3.8 3.7 3.6 3.7  CL 104 100* 102 102  CO2 27  --  28 26  GLUCOSE 239* 243* 185* 163*  BUN 25* 30* 22* 19  CREATININE 1.33* 1.20 1.25* 1.27*  CALCIUM 8.9  --  8.7* 8.8*   Liver Function Tests:  Recent Labs Lab 01/19/15 1133  AST 34  ALT 25  ALKPHOS 76  BILITOT 0.9  PROT 5.2*  ALBUMIN 3.1*   No results for input(s): LIPASE,  AMYLASE in the last 168 hours. No results for input(s): AMMONIA in the last 168 hours. CBC:  Recent Labs Lab 01/19/15 1133 01/19/15 1138 01/20/15 0444  WBC 6.7  --  6.0  NEUTROABS 4.7  --   --   HGB 14.2 14.6 13.3  HCT 42.1 43.0 39.3  MCV 85.9  --  86.2  PLT 98*  --  98*   Cardiac Enzymes:  Recent Labs Lab 01/19/15 1744 01/20/15 0444  TROPONINI <0.03 <0.03   BNP: Invalid input(s): POCBNP CBG:  Recent Labs Lab 01/20/15 1219 01/20/15 1720 01/20/15 2105 01/21/15 0744 01/21/15 1203  GLUCAP 180* 231* 194* 162* 189*    No results found for this or any previous visit (from the past 240 hour(s)).   Studies:              All Imaging reviewed and is as per above notation   Scheduled Meds: . aspirin EC  81 mg Oral Daily  . aspirin  300 mg Rectal Daily   Or  . aspirin  325 mg Oral Daily  . budesonide  2 mL Inhalation BID  . enoxaparin (LOVENOX) injection  40 mg Subcutaneous Q24H  . fluticasone  1 spray Each Nare Daily  .  insulin aspart  0-5 Units Subcutaneous QHS  . insulin aspart  0-9 Units Subcutaneous TID WC  . memantine  5 mg Oral QHS  . montelukast  10 mg Oral Daily  . multivitamin-lutein  1 capsule Oral BID  . Travoprost (BAK Free)  1 drop Both Eyes Daily   Continuous Infusions: . sodium chloride Stopped (01/20/15 0002)     Assessment/Plan:  1. Toxic met encephalopathy-cause unclear-?Haldol, Lack of sleep.  NOt infectious and patient now back at baseline NO CONCERN FOR CVA-Echo being performed for ?Abn EKG.  Needs follow up as OP at Donald. Krista [facility] office- TSH, B12 wnl continue Memantine 5 qhs, Haldol.  Would Not add trazadone unless okayed by OP Neurology 2. Polycythemia Vera-OP follow up Donald. Marin Kaiser as required 3. H/o CABG x 5 1996-stable and quiescent-Cont Asa 81 daily-not on ACE, BB. 4. ?RLE swelling- doppler bilat LE doppler neg. 5. COPD/Asthma-cont Budesonide 2 ml bid, Montelukast 10 od-no wheeze. 6. Abn EKG-echo pending.  Trop wnl 7. Adult FTt and mod  P-Em-Nutrition input appreciated.  Therapy rec's SNF-S/w to clarify if meets criteria 8. Sacral decub-WOC nurse consult pending  Code Status: full Family Communication: inpatient Disposition Plan:   multiple long discussions with family members 2 daughters state we are "kicking out" their father from the hospital 1 daughter in particular became verbally abusive to this examiner and stated that "we are not treating him right we are not giving him the right medications and we are changing things on him"  That daughter was requested to comport herself in a becoming way  Patient has been suitably stabilized in hospital, has not had any further recurrence of the episode that he had previously and at their request I spoke to neurology who came back to see the patient and while we all accept the fact that the patient needs skilled services, patient does not need inpatient hospital stay I've asked case management to come back and talk to the family regarding what options may be available  Family is appealing his Medicare rights and we await decision making from Medicare  From my standpoint patient is medically stable for discharge and I have gone over the medication list exhaustively on discharge with the other daughter in the room to make sure she understands what her father should be on as there are no specific changes from prior medications   80 minutes  Donald Griffes, MD  [facility] Pager [phone] 01/21/2015, 1:35 PM    LOS: 2 days

## 2015-01-22 LAB — GLUCOSE, CAPILLARY
GLUCOSE-CAPILLARY: 124 mg/dL — AB (ref 65–99)
Glucose-Capillary: 197 mg/dL — ABNORMAL HIGH (ref 65–99)
Glucose-Capillary: 159 mg/dL — ABNORMAL HIGH (ref 65–99)
Glucose-Capillary: 245 mg/dL — ABNORMAL HIGH (ref 65–99)

## 2015-01-22 LAB — BASIC METABOLIC PANEL
Anion gap: 10 (ref 5–15)
BUN: 17 mg/dL (ref 6–20)
CO2: 27 mmol/L (ref 22–32)
Calcium: 9.1 mg/dL (ref 8.9–10.3)
Chloride: 103 mmol/L (ref 101–111)
Creatinine, Ser: 1.24 mg/dL (ref 0.61–1.24)
GFR calc Af Amer: 59 mL/min — ABNORMAL LOW (ref >60)
GFR, EST NON AFRICAN AMERICAN: 51 mL/min — AB (ref >60)
Glucose, Bld: 129 mg/dL — ABNORMAL HIGH (ref 65–99)
POTASSIUM: 4 mmol/L (ref 3.5–5.1)
SODIUM: 140 mmol/L (ref 135–145)

## 2015-01-22 MED ORDER — POLYETHYLENE GLYCOL 3350 17 G PO PACK
17.0000 g | PACK | Freq: Every day | ORAL | Status: DC
  Administered 2015-01-22 – 2015-01-23 (×2): 17 g via ORAL
  Filled 2015-01-22 (×2): qty 1

## 2015-01-22 NOTE — Progress Notes (Signed)
Donald Kaiser MOQ:[id number] DOB: 1926-09-24 DOA: 01/19/2015 PCP: [name], MD  Brief narrative:  81 y/o ? Legally blind since 2009 follows Dr [name] [facility]rology as OP-noted to have hallucinations, gait disorder, and confusion recently switched from Haldol to Seroquel 25 mg 1-2 qhs tyII DM + periph Neuropathy H/o polycythemia vera follwed by Dr. [name] with intermittent phlebotomies CABG x 5 1996 COPD BPH Degen disc disease ECOG status 3  Past medical history-As per Problem list Chart reviewed as below-   Consultants:  neuro  Procedures:  MRI/Ct/Carotids  EEG  Echo  Antibiotics:     Subjective   Some jerking x 2 per daugther Lasting ~ 25 seconds No Watson Sz like activity No tongue biting No loss of urine    Objective    Interim History:   Telemetry:    Objective: Filed Vitals:   01/21/15 2106 01/21/15 2132 01/22/15 0602 01/22/15 0747  BP:  146/57 142/57   Pulse:  61 61   Temp:  98.4 F (36.9 C) 98.4 F (36.9 C)   TempSrc:  Oral Oral   Resp:  18 18   Height:      Weight:      SpO2: 99% 99% 100% 99%    Intake/Output Summary (Last 24 hours) at 01/22/15 1337 Last data filed at 01/22/15 0900  Gross per 24 hour  Intake    480 ml  Output   1150 ml  Net   -670 ml    Exam:  General: eomi ncat  Cardiovascular: s1 s2 no m/r/g Respiratory: clear no added sound Abdomen: soft nt nd no rebound Skin no LE edema Neuro intact moving all 4 limbs Stage 1-2 sacral decubitus sacrum  Data Reviewed: Basic Metabolic Panel:  Recent Labs Lab 01/19/15 1133 01/19/15 1138 01/20/15 0444 01/21/15 0452 01/22/15 0538  NA 139 138 139 138 140  K 3.8 3.7 3.6 3.7 4.0  CL 104 100* 102 102 103  CO2 27  --  28 26 27   GLUCOSE 239* 243* 185* 163* 129*  BUN 25* 30* 22* 19 17  CREATININE 1.33* 1.20 1.25* 1.27* 1.24  CALCIUM 8.9  --  8.7* 8.8* 9.1   Liver Function Tests:  Recent Labs Lab 01/19/15 1133  AST 34  ALT 25  ALKPHOS 76  BILITOT 0.9    PROT 5.2*  ALBUMIN 3.1*   No results for input(s): LIPASE, AMYLASE in the last 168 hours. No results for input(s): AMMONIA in the last 168 hours. CBC:  Recent Labs Lab 01/19/15 1133 01/19/15 1138 01/20/15 0444  WBC 6.7  --  6.0  NEUTROABS 4.7  --   --   HGB 14.2 14.6 13.3  HCT 42.1 43.0 39.3  MCV 85.9  --  86.2  PLT 98*  --  98*   Cardiac Enzymes:  Recent Labs Lab 01/19/15 1744 01/20/15 0444  TROPONINI <0.03 <0.03   BNP: Invalid input(s): POCBNP CBG:  Recent Labs Lab 01/21/15 1203 01/21/15 1700 01/21/15 2128 01/22/15 0752 01/22/15 1130  GLUCAP 189* 215* 156* 124* 197*    No results found for this or any previous visit (from the past 240 hour(s)).   Studies:              All Imaging reviewed and is as per above notation   Scheduled Meds: . aspirin EC  81 mg Oral Daily  . aspirin  300 mg Rectal Daily   Or  . aspirin  325 mg Oral Daily  . budesonide  2 mL  Inhalation BID  . enoxaparin (LOVENOX) injection  40 mg Subcutaneous Q24H  . fluticasone  1 spray Each Nare Daily  . insulin aspart  0-5 Units Subcutaneous QHS  . insulin aspart  0-9 Units Subcutaneous TID WC  . memantine  5 mg Oral QHS  . montelukast  10 mg Oral Daily  . multivitamin-lutein  1 capsule Oral BID  . polyethylene glycol  17 g Oral Daily  . Travoprost (BAK Free)  1 drop Both Eyes Daily  . traZODone  50 mg Oral QHS   Continuous Infusions: . sodium chloride Stopped (01/20/15 0002)     Assessment/Plan:  1. Toxic met encephalopathy-cause unclear-?Haldol, Lack of sleep.  Not infectious and patient now back at baseline NO CONCERN FOR CVA-Echo being performed for ?Abn EKG.  Needs follow up as OP at Dr. [name] office- TSH, B12 wnl continue Memantine 5 qhs, Haldol.  Cont trazadone 2. Possible Sz activity-EEG performed a and was neg.  Patient had 3 epsiodes of shaking in LE and some alteration of MS subsequently moresoe than normal.  Dr Nicole Kindred Neuro rec's rpt EEG which is  pending 3. Polycythemia Vera-OP follow up Dr. [name] as required 4. H/o CABG x 5 1996-stable and quiescent-Cont Asa 81 daily-not on ACE, BB. 5. ?RLE swelling- doppler bilat LE doppler neg. 6. COPD/Asthma-cont Budesonide 2 ml bid, Montelukast 10 od-no wheeze. 7. Abn EKG-echo pending.  Trop wnl 8. Adult FTt and mod P-Em-Nutrition input appreciated.  Therapy rec's SNF-S/w to clarify if meets criteria 9. Sacral decub-WOC nurse consult pending  Code Status: full Family Communication: d/ w daughter/wife at bedside Disposition Plan: possible SNF   80 minutes  [name], MD  [facility] Pager [phone] 01/22/2015, 1:37 PM    LOS: 3 days

## 2015-01-22 NOTE — Progress Notes (Signed)
Pt ambulated 8 paces assisted with Donald Kaiser Plus machine and sat in chair for dinner. Speech much clearer with patient fully awake and alert.

## 2015-01-22 NOTE — Progress Notes (Addendum)
Pt's daughter reported witnessing two right-sided "seizures" in which the patient's R extremities "became rigid", "speech more slurred", and pt "did not know his birth date".   Neuro exam by this RN did not reveal any R side deficits (face or extremeties), or changes in speech since this morning. Patient correctly stated his birth date. Pt has generalized weakness consistent with prior assessments.  Family requests neurologist consult. Dr Verlon Au notified.

## 2015-01-22 NOTE — Clinical Social Work Note (Signed)
Clinical Social Work Assessment  Patient Details  Name: DOCTOR SHEAHAN MRN: [id number] Date of Birth: 1926/11/09  Date of referral:  01/22/15               Reason for consult:  Facility Placement                Permission sought to share information with:  Family Supports Permission granted to share information::  Yes, Verbal Permission Granted  Name::        Agency::     Relationship::     Contact Information:     Housing/Transportation Living arrangements for the past 2 months:  Single Family Home Source of Information:  Spouse, Adult Children Patient Interpreter Needed:  None Criminal Activity/Legal Involvement Pertinent to Current Situation/Hospitalization:  No - Comment as needed Significant Relationships:  Adult Children Lives with:  Spouse Do you feel safe going back to the place where you live?  Yes Need for family participation in patient care:  Yes (Comment)  Care giving concerns: CSW met with patient's wife and daughter while patient slept. CSW shared list of facilities and explained the process for placement. Wife identified desired facilities as 1 [facility], 2 [facility], and 3 [facility].    Social Worker assessment / plan: CSW was able to get placement for 10/16 at [facility]. CSW made family, RN and MD aware of placement. CSW was told that an EEG was ordered and that the patient would not be discharged until after that process was complete which will likely be 10/17. CSW cancelled discharge plans to [facility] for today.   Employment status:  Retired Health visitor, Managed Care PT Recommendations:  [place] / Referral to community resources:  [place]  Patient/Family's Response to care: Patient's family was pleasant and stated that they appreciated CSW support.   Patient/Family's Understanding of and Emotional Response to Diagnosis, Current Treatment, and Prognosis:  Family expressed concerned on what  they felt was seizure activity and that they requested the EEG for further assessment.   Emotional Assessment Appearance:  Appears stated age Attitude/Demeanor/Rapport:   (Pleasant, quiet) Affect (typically observed):   (Pleasant, quiet) Orientation:  Oriented to Self, Oriented to Place, Oriented to Situation Alcohol / Substance use:  Not Applicable Psych involvement (Current and /or in the community):  No (Comment)  Discharge Needs  Concerns to be addressed:  No discharge needs identified Readmission within the last 30 days:  No Current discharge risk:  None Barriers to Discharge:  No Barriers Identified   Christene Lye, LCSW 01/22/2015, 6:04 PM

## 2015-01-23 ENCOUNTER — Inpatient Hospital Stay (HOSPITAL_COMMUNITY): Payer: Medicare Other

## 2015-01-23 LAB — GLUCOSE, CAPILLARY
GLUCOSE-CAPILLARY: 205 mg/dL — AB (ref 65–99)
Glucose-Capillary: 139 mg/dL — ABNORMAL HIGH (ref 65–99)

## 2015-01-23 MED ORDER — POLYETHYLENE GLYCOL 3350 17 G PO PACK: 17.0000 g | PACK | Freq: Every day | ORAL | Status: AC

## 2015-01-23 MED ORDER — TRAZODONE HCL 50 MG PO TABS: 50.0000 mg | ORAL_TABLET | Freq: Every day | ORAL | Status: AC

## 2015-01-23 MED ORDER — HALOPERIDOL 2 MG PO TABS: 2.0000 mg | ORAL_TABLET | Freq: Every evening | ORAL | Status: AC | PRN

## 2015-01-23 NOTE — Discharge Summary (Signed)
Physician Discharge Summary  Donald Kaiser LNL:[id number] DOB: 07/20/1926 DOA: 01/19/2015  PCP: [name], MD  Admit date: 01/19/2015 Discharge date: 01/23/2015  Time spent: 40 minutes  Recommendations for Outpatient Follow-up:  1. Recommend close follow-up as an outpatient at nursing facility 2. Recommend outpatient follow-up and may be follow-up with neurology acutely in the next 1-2 weeks given progressive decline in condition--Please follow up with Dr. [name]. 3. Patient's family elected to leave the hospital prior to results of EEG being known and this will have to be discussed as an outpatient with them in the meanwhile would continue all other medications. No antiepileptic medications have been added to dictation illness 4. Patient's echocardiogram also is pending and this will need to be discussed and followed as an outpatient  Discharge Diagnoses:  Principal Problem:   Acute encephalopathy Active Problems:   Diabetes mellitus ([place])   Obstructive sleep apnea   G E R D   Polycythemia vera ([place])   Adult failure to thrive   Discharge Condition: Fair  Diet recommendation: Heart healthy low-salt  Filed Weights   01/19/15 2056  Weight: 82.3 kg (181 lb 7 oz)    History of present illness:  81 y/o ? Legally blind since 2009 follows Dr [name] Neurology as OP-noted to have hallucinations, gait disorder, and confusion recently switched from Haldol to Seroquel 25 mg 1-2 qhs tyII DM + periph Neuropathy H/o polycythemia vera follwed by Dr. [name] with intermittent phlebotomies CABG x 5 1996 COPD BPH Degen disc disease ECOG status 3  Hospital Course:    1. Toxic met encephalopathy-cause unclear-?Haldol, Lack of sleep versus central nervous system degenerative disorder which may include mild dementia. Patient has waxing and waning mentation which may be related to his primary diagnosis NO CONCERN FOR CVA-Echo being performed for ?Abn EKG. Needs follow up as OP at Dr. [name]  office- TSH, B12 wnl. continue Memantine 5 qhs, Haldol. Cont trazadone 50 daily at bedtime 2. Possible Sz activity-EEG performed initially was neg. Patient had 3 epsiodes of shaking in LE and some alteration of MS subsequently more so than normal. Dr Nicole Kindred Neuro rec's rpt EEG which has been performed and is pending. Need to discuss as an outpatient with outpatient neurologist Dr. [name] whether initiation of antiepileptic medications as needed versus not 3. Polycythemia Vera-OP follow up Dr. [name] as required. 4. Mild to moderate dementia MMSE 21/27--will need further follow-up as an outpatient with urology 5. H/o CABG x 5 1996-stable and quiescent-Cont Asa 81 daily-not on ACE, BB. Echocardiogram was ordered given abnormal EKG and is pending at time of discharge 6. ?RLE swelling- doppler bilat LE doppler neg. 7. COPD/Asthma-cont Budesonide 2 ml bid, Montelukast 10 od-no wheeze.  8. Adult FTt and mod P-Em-Nutrition input appreciated. Therapy rec's SNF-S/w to clarify if meets criteria 9. Sacral decub-WOC nurse consult pending  Procedures:  MRI brain  EEG  Echocardiogram  Consultations:  Neurology  Discharge Exam: Filed Vitals:   01/23/15 0847  BP:   Pulse: 56  Temp:   Resp: 18    General: eomi ncat Cardiovascular:  s1 s 2no m/r/g Respiratory:  Clear More alert oriented still weakness in lower extremities and difficulty moving them however can talk to me somewhat clearly and 4 seems stronger Family states was a little confused this morning  Discharge Instructions   Discharge Instructions    Diet - low sodium heart healthy    Complete by:  As directed      Diet - low sodium heart  healthy    Complete by:  As directed      Increase activity slowly    Complete by:  As directed      Increase activity slowly    Complete by:  As directed           Current Discharge Medication List    START taking these medications   Details  fluocinonide (LIDEX) 0.05 % external  solution Apply 1 application topically 2 (two) times daily. Qty: 60 mL, Refills: 0    haloperidol (HALDOL) 2 MG tablet Take 1 tablet (2 mg total) by mouth at bedtime as needed and may repeat dose one time if needed for agitation. Qty: 40 tablet, Refills: 0    polyethylene glycol (MIRALAX / GLYCOLAX) packet Take 17 g by mouth daily. Qty: 14 each, Refills: 0    simvastatin (ZOCOR) 40 MG tablet Take 1 tablet (40 mg total) by mouth daily. Qty: 30 tablet      CONTINUE these medications which have CHANGED   Details  furosemide (LASIX) 40 MG tablet Take 0.5 tablets (20 mg total) by mouth daily. Qty: 30 tablet, Refills: 0    traZODone (DESYREL) 50 MG tablet Take 1 tablet (50 mg total) by mouth at bedtime. Qty: 30 tablet, Refills: 0      CONTINUE these medications which have NOT CHANGED   Details  albuterol-ipratropium (COMBIVENT) 18-103 MCG/ACT inhaler Inhale 1-2 puffs into the lungs every 4 (four) hours as needed for wheezing or shortness of breath.     aspirin 81 MG tablet Take 81 mg by mouth daily.      desonide (DESOWEN) 0.05 % cream Apply 1 application topically daily as needed (affected areas).     Ergocalciferol (VITAMIN D2) 2000 UNITS TABS Take 1 tablet by mouth daily.      memantine (NAMENDA) 5 MG tablet Take 5 mg by mouth at bedtime. Refills: 2    metoprolol (LOPRESSOR) 50 MG tablet Take 25 mg by mouth 2 (two) times daily.     mometasone (ASMANEX 120 METERED DOSES) 220 MCG/INH inhaler Inhale 2 puffs into the lungs daily as needed (shortness of breath).     mometasone (NASONEX) 50 MCG/ACT nasal spray Place 2 sprays into the nose daily. Qty: 17 g    montelukast (SINGULAIR) 10 MG tablet Take 10 mg by mouth daily at 12 noon.     Multiple Vitamins-Minerals (PRESERVISION AREDS PO) Take 1 capsule by mouth 2 (two) times daily.      nitroGLYCERIN (NITROSTAT) 0.4 MG SL tablet Place 0.4 mg under the tongue every 5 (five) minutes as needed.      solifenacin (VESICARE) 5 MG  tablet Take 1 tablet (5 mg total) by mouth daily. Qty: 30 tablet, Refills: 6    traMADol (ULTRAM) 50 MG tablet Take 50 mg by mouth every 6 (six) hours as needed for moderate pain or severe pain.     travoprost, benzalkonium, (TRAVATAN) 0.004 % ophthalmic solution Place 1 drop into both eyes daily.       STOP taking these medications     QUEtiapine (SEROQUEL) 25 MG tablet        No Known Allergies    The results of significant diagnostics from this hospitalization (including imaging, microbiology, ancillary and laboratory) are listed below for reference.    Significant Diagnostic Studies: Ct Head Wo Contrast  01/19/2015  CLINICAL DATA:  Altered mental status, nonverbal EXAM: CT HEAD WITHOUT CONTRAST TECHNIQUE: Contiguous axial images were obtained from the base of the skull  through the vertex without intravenous contrast. COMPARISON:  None. FINDINGS: There is no evidence of mass effect, midline shift, or extra-axial fluid collections. There is no evidence of a space-occupying lesion or intracranial hemorrhage. There is no evidence of a cortical-based area of acute infarction. There is generalized cerebral atrophy. There is periventricular white matter low attenuation likely secondary to microangiopathy. The ventricles and sulci are appropriate for the patient's age. The basal cisterns are patent. Visualized portions of the orbits are unremarkable. Complete opacification of the right maxillary sinus. There is rightward deviation of the nasal septum. Cerebrovascular atherosclerotic calcifications are noted. The osseous structures are unremarkable. IMPRESSION: 1. No acute intracranial pathology. 2. Chronic microvascular disease and cerebral atrophy. Electronically Signed   By: Kathreen Devoid   On: 01/19/2015 11:32   Mr Brain Wo Contrast  01/19/2015  CLINICAL DATA:  Initial evaluation for acute altered mental status, weakness, right-sided facial droop. Evaluate for stroke. EXAM: MRI HEAD WITHOUT  CONTRAST MRA HEAD WITHOUT CONTRAST TECHNIQUE: Multiplanar, multiecho pulse sequences of the brain and surrounding structures were obtained without intravenous contrast. Angiographic images of the head were obtained using MRA technique without contrast. COMPARISON:  Prior CT from earlier the same day. FINDINGS: MRI HEAD FINDINGS Diffuse prominence of the CSF containing spaces is compatible with generalized age-related cerebral atrophy. No significant white matter disease for patient age. No abnormal foci of restricted diffusion to suggest acute intracranial infarct. Gray-white matter differentiation maintained. Normal intravascular flow voids are maintained. No acute or chronic intracranial hemorrhage. No mass lesion, midline shift, or mass effect. Mild ventricular prominence related to global parenchymal volume loss without hydrocephalus. No extra-axial fluid collection. Craniocervical junction within normal limits. Pituitary gland normal. No acute abnormality about the orbits. Sequela of prior bilateral lens extraction noted. Right maxillary sinus is small and completely opacified, likely chronic in nature. Mild scattered mucosal thickening within the ethmoidal air cells. Trace opacity within the right mastoid air cells. Bone marrow signal intensity within normal limits. T2 hyperintense lesion within the right parietal scalp noted, likely benign. No scalp soft tissue abnormality. MRA HEAD FINDINGS ANTERIOR CIRCULATION: Eighth visualized distal cervical segments of the internal carotid arteries are patent with antegrade flow. Petrous, cavernous, and supra clinoid segments well opacified. A1 segments widely patent. Anterior communicating artery normal. Anterior cerebral arteries opacified to their distal aspects. M1 segments patent without focal stenosis or occlusion. MCA bifurcations normal. Distal MCA branches fairly symmetric bilaterally. Minimal distal small vessel atheromatous changes within the MCA branches  bilaterally. POSTERIOR CIRCULATION: Vertebral arteries are patent to the vertebrobasilar junction. Posterior inferior cerebral arteries are patent proximally. Basilar artery demonstrates mild multi focal atheromatous irregularity with narrowing. No high-grade stenosis. Basilar artery is opacified to its distal aspect. Superior cerebral arteries opacified bilaterally. Both posterior cerebral arteries arise from the basilar artery and are opacified to the distal aspects. Mild atheromatous irregularity within the PCAs bilaterally. No aneurysm. Apparent focal blush of high signal intensity will in the region of the right middle cerebellar peduncle favored to be artifact in nature with no definite underlying vascular malformation or other acute abnormality. IMPRESSION: MRI HEAD IMPRESSION: 1. No acute intracranial infarct or other process identified. 2. Generalized age-related cerebral atrophy. 3. 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 MRA HEAD IMPRESSION: 1. No acute large or proximal arterial branch occlusion within the intracranial circulation. No focal severe or correctable stenosis. 2. Mild atheromatous irregularity within the proximal basilar artery without high-grade stenosis. 3. Mild distal small branch atheromatous irregularity within the  MCA and PCA branches bilaterally. Electronically Signed   By: Jeannine Boga M.D.   On: 01/19/2015 23:53   Dg Chest Port 1 View  01/19/2015  CLINICAL DATA:  Altered mental status EXAM: PORTABLE CHEST 1 VIEW COMPARISON:  04/06/2011 FINDINGS: Normal heart size and stable aortic tortuosity post CABG. There is no edema, consolidation, effusion, or pneumothorax. Calcified granuloma in the right lung. No acute osseous finding. IMPRESSION: Stable exam.  No evidence of acute cardiopulmonary disease. Electronically Signed   By: Monte Fantasia M.D.   On: 01/19/2015 11:31   Mr Jodene Nam Head/brain Wo Cm  01/19/2015  CLINICAL DATA:  Initial evaluation for acute  altered mental status, weakness, right-sided facial droop. Evaluate for stroke. EXAM: MRI HEAD WITHOUT CONTRAST MRA HEAD WITHOUT CONTRAST TECHNIQUE: Multiplanar, multiecho pulse sequences of the brain and surrounding structures were obtained without intravenous contrast. Angiographic images of the head were obtained using MRA technique without contrast. COMPARISON:  Prior CT from earlier the same day. FINDINGS: MRI HEAD FINDINGS Diffuse prominence of the CSF containing spaces is compatible with generalized age-related cerebral atrophy. No significant white matter disease for patient age. No abnormal foci of restricted diffusion to suggest acute intracranial infarct. Gray-white matter differentiation maintained. Normal intravascular flow voids are maintained. No acute or chronic intracranial hemorrhage. No mass lesion, midline shift, or mass effect. Mild ventricular prominence related to global parenchymal volume loss without hydrocephalus. No extra-axial fluid collection. Craniocervical junction within normal limits. Pituitary gland normal. No acute abnormality about the orbits. Sequela of prior bilateral lens extraction noted. Right maxillary sinus is small and completely opacified, likely chronic in nature. Mild scattered mucosal thickening within the ethmoidal air cells. Trace opacity within the right mastoid air cells. Bone marrow signal intensity within normal limits. T2 hyperintense lesion within the right parietal scalp noted, likely benign. No scalp soft tissue abnormality. MRA HEAD FINDINGS ANTERIOR CIRCULATION: Eighth visualized distal cervical segments of the internal carotid arteries are patent with antegrade flow. Petrous, cavernous, and supra clinoid segments well opacified. A1 segments widely patent. Anterior communicating artery normal. Anterior cerebral arteries opacified to their distal aspects. M1 segments patent without focal stenosis or occlusion. MCA bifurcations normal. Distal MCA branches  fairly symmetric bilaterally. Minimal distal small vessel atheromatous changes within the MCA branches bilaterally. POSTERIOR CIRCULATION: Vertebral arteries are patent to the vertebrobasilar junction. Posterior inferior cerebral arteries are patent proximally. Basilar artery demonstrates mild multi focal atheromatous irregularity with narrowing. No high-grade stenosis. Basilar artery is opacified to its distal aspect. Superior cerebral arteries opacified bilaterally. Both posterior cerebral arteries arise from the basilar artery and are opacified to the distal aspects. Mild atheromatous irregularity within the PCAs bilaterally. No aneurysm. Apparent focal blush of high signal intensity will in the region of the right middle cerebellar peduncle favored to be artifact in nature with no definite underlying vascular malformation or other acute abnormality. IMPRESSION: MRI HEAD IMPRESSION: 1. No acute intracranial infarct or other process identified. 2. Generalized age-related cerebral atrophy. 3. 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 MRA HEAD IMPRESSION: 1. No acute large or proximal arterial branch occlusion within the intracranial circulation. No focal severe or correctable stenosis. 2. Mild atheromatous irregularity within the proximal basilar artery without high-grade stenosis. 3. Mild distal small branch atheromatous irregularity within the MCA and PCA branches bilaterally. Electronically Signed   By: Jeannine Boga M.D.   On: 01/19/2015 23:53    Microbiology: No results found for this or any previous visit (from the past 240 hour(s)).  Labs: Basic Metabolic Panel:  Recent Labs Lab 01/19/15 1133 01/19/15 1138 01/20/15 0444 01/21/15 0452 01/22/15 0538  NA 139 138 139 138 140  K 3.8 3.7 3.6 3.7 4.0  CL 104 100* 102 102 103  CO2 27  --  28 26 27   GLUCOSE 239* 243* 185* 163* 129*  BUN 25* 30* 22* 19 17  CREATININE 1.33* 1.20 1.25* 1.27* 1.24  CALCIUM 8.9  --  8.7* 8.8*  9.1   Liver Function Tests:  Recent Labs Lab 01/19/15 1133  AST 34  ALT 25  ALKPHOS 76  BILITOT 0.9  PROT 5.2*  ALBUMIN 3.1*   No results for input(s): LIPASE, AMYLASE in the last 168 hours. No results for input(s): AMMONIA in the last 168 hours. CBC:  Recent Labs Lab 01/19/15 1133 01/19/15 1138 01/20/15 0444  WBC 6.7  --  6.0  NEUTROABS 4.7  --   --   HGB 14.2 14.6 13.3  HCT 42.1 43.0 39.3  MCV 85.9  --  86.2  PLT 98*  --  98*   Cardiac Enzymes:  Recent Labs Lab 01/19/15 1744 01/20/15 0444  TROPONINI <0.03 <0.03   BNP: BNP (last 3 results) No results for input(s): BNP in the last 8760 hours.  ProBNP (last 3 results) No results for input(s): PROBNP in the last 8760 hours.  CBG:  Recent Labs Lab 01/22/15 0752 01/22/15 1130 01/22/15 1640 01/22/15 2201 01/23/15 0734  GLUCAP 124* 197* 159* 245* 139*       Signed:  Nita Sells  [facility] 01/23/2015, 11:42 AM

## 2015-01-23 NOTE — Care Management Important Message (Signed)
Important Message  Patient Details  Name: Donald Kaiser MRN: [id number] Date of Birth: Dec 28, 1926   Medicare Important Message Given:  Yes-second notification given    Nathen May 01/23/2015, 12:07 PM

## 2015-01-23 NOTE — Clinical Social Work Note (Signed)
Patient to be d/c'ed today to [facility].  Patient and family agreeable to plans will transport via ems RN to call report.  Donald Kaiser, patient's wife and his daughter was also notified.  Evette Cristal, MSW, [place]

## 2015-01-23 NOTE — Procedures (Signed)
EEG report.  Brief clinical history: 81 y/o male presenting to hospital after sudden onset of AMS. Patient has been experiencing 3 month rapid decline in gait and memory. MRI brain and MRA show no causes for event.   Technique: this is a 17 channel routine scalp EEG performed at the bedside with bipolar and monopolar montages arranged in accordance to the international 10/20 system of electrode placement. One channel was dedicated to EKG recording.  Patient is asleep for the entirety of the study. Hyperventilation and intermittent photic stimulation were utilized as activating procedures.  Description: as the study opens and throughout the entire study, there is evidence of normal sleep architecture. Patient did not spend time in the wakeful state which precludes best background assessment. No focal or generalized epileptiform discharges noted.  No electrographic seizures seen. EKG showed sinus rhythm.   Impression: this is a normal awake EEG. Please, be aware that a normal EEG does not exclude the possibility of epilepsy.  Clinical correlation is advised.   Dorian Pod, MD [facility]

## 2015-01-23 NOTE — Progress Notes (Signed)
EEG Completed; Results Pending  

## 2015-01-23 NOTE — Clinical Social Work Placement (Signed)
   CLINICAL SOCIAL WORK PLACEMENT  NOTE  Date:  01/23/2015  Patient Details  Name: Donald Kaiser MRN: [id number] Date of Birth: 29-May-1926  Clinical Social Work is seeking post-discharge placement for this patient at the [place] level of care (*CSW will initial, date and re-position this form in  chart as items are completed):  Yes   Patient/family provided with [facility]'s list of facilities offering this level of care within the geographic area requested by the patient (or if unable, by the patient's family).  Yes   Patient/family informed of their freedom to choose among providers that offer the needed level of care, that participate in Medicare, Medicaid or managed care program needed by the patient, have an available bed and are willing to accept the patient.  Yes   Patient/family informed of [facility]'s ownership interest in [facility] and [facility], as well as of the fact that they are under no obligation to receive care at these facilities.  PASRR submitted to EDS on 01/22/15     PASRR number received on 01/22/15     Existing PASRR number confirmed on       FL2 transmitted to all facilities in geographic area requested by pt/family on 01/22/15     FL2 transmitted to all facilities within larger geographic area on 01/22/15     Patient informed that his/her managed care company has contracts with or will negotiate with certain facilities, including the following:        Yes   Patient/family informed of bed offers received.  Patient chooses bed at [place], [place]     Physician recommends and patient chooses bed at      Patient to be transferred to [facility] on 01/23/15.  Patient to be transferred to facility by [facility]     Patient family notified on 01/23/15 of transfer.  Name of family member notified:  Chigozie Basaldua patient's wife     PHYSICIAN Please sign FL2      Additional Comment:    _______________________________________________ Ross Ludwig, LCSWA 01/23/2015, 12:44 PM

## 2015-01-23 NOTE — Progress Notes (Signed)
OT Cancellation Note  Patient Details Name: Donald Kaiser MRN: [id number] DOB: 03-Jun-1926   Cancelled Treatment:    Reason Eval/Treat Not Completed:  (OT screened) Per chart, pt to d/c to SNF today. Will defer all further OT needs to next venue of care.  Benito Mccreedy OTR/L [phone] 01/23/2015, 2:22 PM

## 2015-01-23 NOTE — Care Management Note (Signed)
Case Management Note  Patient Details  Name: TIYON SANOR MRN: [id number] Date of Birth: 1926-12-09  Subjective/Objective:   Patient is for dc to snf today.  CSW following.                 Action/Plan:   Expected Discharge Date:  01/22/15               Expected Discharge Plan:  Skilled Nursing Facility  In-House Referral:  Clinical Social Work  Discharge planning Services  CM Consult  Post Acute Care Choice:    Choice offered to:     DME Arranged:    DME Agency:     HH Arranged:  RN, PT, OT, Nurse's Aide [place] Agency:  [place]  Status of Service:  Completed, signed off  Medicare Important Message Given:  Yes-second notification given Date Medicare IM Given:    Medicare IM give by:    Date Additional Medicare IM Given:    Additional Medicare Important Message give by:     If discussed at [place] of Stay Meetings, dates discussed:    Additional Comments:  Zenon Mayo, RN 01/23/2015, 2:40 PM

## 2015-02-08 ENCOUNTER — Emergency Department (HOSPITAL_COMMUNITY)
Admission: EM | Admit: 2015-02-08 | Discharge: 2015-02-08 | Disposition: A | Discharge status: Home / Self Care | Payer: Medicare Other | Attending: Emergency Medicine | Admitting: Emergency Medicine

## 2015-02-08 ENCOUNTER — Encounter (HOSPITAL_COMMUNITY): Payer: Self-pay

## 2015-02-08 DIAGNOSIS — K219 Gastro-esophageal reflux disease without esophagitis: Secondary | ICD-10-CM | POA: Insufficient documentation

## 2015-02-08 DIAGNOSIS — R4182 Altered mental status, unspecified: Secondary | ICD-10-CM | POA: Insufficient documentation

## 2015-02-08 DIAGNOSIS — Z794 Long term (current) use of insulin: Secondary | ICD-10-CM | POA: Diagnosis not present

## 2015-02-08 DIAGNOSIS — J449 Chronic obstructive pulmonary disease, unspecified: Secondary | ICD-10-CM | POA: Diagnosis not present

## 2015-02-08 DIAGNOSIS — R627 Adult failure to thrive: Secondary | ICD-10-CM | POA: Diagnosis not present

## 2015-02-08 DIAGNOSIS — Z79899 Other long term (current) drug therapy: Secondary | ICD-10-CM | POA: Insufficient documentation

## 2015-02-08 DIAGNOSIS — Z7951 Long term (current) use of inhaled steroids: Secondary | ICD-10-CM | POA: Diagnosis not present

## 2015-02-08 DIAGNOSIS — I10 Essential (primary) hypertension: Secondary | ICD-10-CM | POA: Diagnosis not present

## 2015-02-08 DIAGNOSIS — R569 Unspecified convulsions: Secondary | ICD-10-CM | POA: Diagnosis not present

## 2015-02-08 DIAGNOSIS — I2510 Atherosclerotic heart disease of native coronary artery without angina pectoris: Secondary | ICD-10-CM | POA: Insufficient documentation

## 2015-02-08 DIAGNOSIS — E119 Type 2 diabetes mellitus without complications: Secondary | ICD-10-CM | POA: Insufficient documentation

## 2015-02-08 DIAGNOSIS — Z86718 Personal history of other venous thrombosis and embolism: Secondary | ICD-10-CM | POA: Insufficient documentation

## 2015-02-08 MED ORDER — QUETIAPINE FUMARATE 25 MG PO TABS: 25.0000 mg | ORAL_TABLET | Freq: Every day | ORAL | Status: DC

## 2015-02-08 NOTE — ED Notes (Signed)
Pt.presents with complaint of failure to thrive. Pt. Lives at [facility] facility in [place]. Pt. Family requesting transport following decreased LOC at 1415 today. Pt. Family requesting EMS to not place IV. Family on the way. Pt. Responsive to verbal stimuli.

## 2015-02-08 NOTE — ED Notes (Signed)
PTAR here  ?

## 2015-02-08 NOTE — ED Notes (Signed)
Pt placed on monitor upon arrival to room.Pt monitored by blood pressure, pulse ox, an 12 lead.

## 2015-02-08 NOTE — ED Provider Notes (Signed)
CSN: [id number]     Arrival date & time 02/08/15  1628 History   First MD Initiated Contact with Patient 02/08/15 1630     Chief Complaint  Patient presents with  . Failure To Thrive     (Consider location/radiation/quality/duration/timing/severity/associated sxs/prior Treatment) Patient is a 81 y.o. male presenting with altered mental status. The history is provided by the patient, the EMS personnel, the spouse and a relative. The history is limited by the condition of the patient.  Altered Mental Status Presenting symptoms: behavior changes, confusion, disorientation, memory loss and partial responsiveness   Severity:  Moderate Most recent episode:  Today Episode history:  Single Duration:  30 minutes Timing:  Intermittent Progression:  Waxing and waning Chronicity:  Recurrent Context: dementia, nursing home resident, recent change in medication and recent illness   Context: not head injury and not a recent infection   Associated symptoms: agitation, decreased appetite, eye deviation, hallucinations and seizures   Associated symptoms: no abdominal pain, normal movement, no bladder incontinence, no depression, no difficulty breathing, no fever, no headaches, no light-headedness, no nausea, no palpitations, no rash, no slurred speech, no suicidal behavior, no visual change and no weakness     Past Medical History  Diagnosis Date  . OSA (obstructive sleep apnea)   . GERD (gastroesophageal reflux disease)   . Diabetes mellitus type II   . Asthma   . DVT (deep venous thrombosis) (HCC)     chronic  . Post-phlebitic syndrome   . Polycythemia vera(238.4)     Ennever  . COPD (chronic obstructive pulmonary disease) ([place])   . Hypertension   . Tremors of nervous system   . Gait difficulty   . Memory loss   . Coronary artery disease   . Arthritis     OA   Past Surgical History  Procedure Laterality Date  . Tonsillectomy    . Artrial bypass      x 5  . Back surgery    . Broken  leg surgery    . Coronary artery bypass graft     Family History  Problem Relation Age of Onset  . Emphysema Sister   . Allergies Sister   . Asthma Sister   . Heart disease Brother   . Heart disease Brother   . Rheum arthritis Sister   . Rheum arthritis Mother   . Cancer Mother   . Cancer Sister   . Kidney failure Father   . Stroke Sister    Social History  Substance Use Topics  . Smoking status: Never Smoker   . Smokeless tobacco: Never Used     Comment: never used tobaccco  . Alcohol Use: No    Review of Systems  Constitutional: Positive for decreased appetite. Negative for fever.  HENT: Negative for facial swelling.   Respiratory: Negative for shortness of breath.   Cardiovascular: Negative for chest pain and palpitations.  Gastrointestinal: Negative for nausea and abdominal pain.  Genitourinary: Negative for bladder incontinence and dysuria.  Musculoskeletal: Negative for back pain.  Skin: Negative for rash.  Neurological: Positive for seizures. Negative for weakness, light-headedness and headaches.  Psychiatric/Behavioral: Positive for hallucinations, memory loss, confusion and agitation.      Allergies  Review of patient's allergies indicates no known allergies.  Home Medications   Prior to Admission medications   Medication Sig Start Date End Date Taking? Authorizing Provider  albuterol-ipratropium (COMBIVENT) 18-103 MCG/ACT inhaler Inhale 1-2 puffs into the lungs every 4 (four) hours as needed for wheezing  or shortness of breath.    Yes Historical Provider, MD  Amino Acids-Protein Hydrolys (FEEDING SUPPLEMENT, PRO-STAT SUGAR FREE 64,) LIQD Take 30 mLs by mouth 2 (two) times daily.   Yes Historical Provider, MD  aspirin 81 MG tablet Take 81 mg by mouth daily.     Yes Historical Provider, MD  Ergocalciferol (VITAMIN D2) 2000 UNITS TABS Take 1 tablet by mouth daily.     Yes Historical Provider, MD  fluocinonide (LIDEX) 0.05 % external solution Apply 1  application topically 2 (two) times daily. 01/21/15  Yes Nita Sells, MD  furosemide (LASIX) 40 MG tablet Take 0.5 tablets (20 mg total) by mouth daily. 01/21/15  Yes Nita Sells, MD  haloperidol (HALDOL) 2 MG tablet Take 1 tablet (2 mg total) by mouth at bedtime as needed and may repeat dose one time if needed for agitation. 01/23/15  Yes Nita Sells, MD  insulin aspart (NOVOLOG) 100 UNIT/ML injection Inject 2-9 Units into the skin 3 (three) times daily before meals. 151-200 = 2 units, 201-250 = 3 units, 251-300 = 5 units, 301-350 = 7 units, 351-400 = 9 units   Yes Historical Provider, MD  memantine (NAMENDA) 5 MG tablet Take 5 mg by mouth at bedtime. 01/14/15  Yes Historical Provider, MD  metFORMIN (GLUCOPHAGE-XR) 500 MG 24 hr tablet Take 500 mg by mouth daily with breakfast.   Yes Historical Provider, MD  metoprolol (LOPRESSOR) 50 MG tablet Take 25 mg by mouth 2 (two) times daily.    Yes Historical Provider, MD  mometasone (ASMANEX 120 METERED DOSES) 220 MCG/INH inhaler Inhale 2 puffs into the lungs daily.    Yes Historical Provider, MD  mometasone (NASONEX) 50 MCG/ACT nasal spray Place 2 sprays into the nose daily. 04/06/13  Yes Elsie Stain, MD  montelukast (SINGULAIR) 10 MG tablet Take 10 mg by mouth daily at 12 noon.    Yes Historical Provider, MD  Multiple Vitamin (MULTIVITAMIN WITH MINERALS) TABS tablet Take 1 tablet by mouth daily.   Yes Historical Provider, MD  Multiple Vitamins-Minerals (PRESERVISION AREDS PO) Take 1 capsule by mouth 2 (two) times daily.     Yes Historical Provider, MD  nitroGLYCERIN (NITROSTAT) 0.4 MG SL tablet Place 0.4 mg under the tongue every 5 (five) minutes as needed.     Yes Historical Provider, MD  polyethylene glycol (MIRALAX / GLYCOLAX) packet Take 17 g by mouth daily. 01/23/15  Yes Nita Sells, MD  simvastatin (ZOCOR) 40 MG tablet Take 1 tablet (40 mg total) by mouth daily. Patient taking differently: Take 40 mg by mouth daily  at 6 PM.  01/21/15  Yes Nita Sells, MD  solifenacin (VESICARE) 5 MG tablet Take 1 tablet (5 mg total) by mouth daily. 01/18/15  Yes Marcial Pacas, MD  traMADol (ULTRAM) 50 MG tablet Take 50 mg by mouth every 6 (six) hours as needed for moderate pain or severe pain.    Yes Historical Provider, MD  Travoprost, BAK Free, (TRAVATAN) 0.004 % SOLN ophthalmic solution Place 1 drop into both eyes at bedtime.   Yes Historical Provider, MD  traZODone (DESYREL) 50 MG tablet Take 1 tablet (50 mg total) by mouth at bedtime. 01/23/15  Yes Nita Sells, MD  QUEtiapine (SEROQUEL) 25 MG tablet Take 1 tablet (25 mg total) by mouth at bedtime. 02/08/15   Hoyle Sauer, MD   BP 135/50 mmHg  Pulse 56  Temp(Src) 97.8 F (36.6 C) (Axillary)  Resp 18  SpO2 98% Physical Exam  Constitutional: He is oriented to  person, place, and time. He appears well-developed and well-nourished. No distress.  HENT:  Head: Normocephalic and atraumatic.  Right Ear: External ear normal.  Left Ear: External ear normal.  Nose: Nose normal.  Mouth/Throat: Oropharynx is clear and moist. No oropharyngeal exudate.  Eyes: Conjunctivae and EOM are normal. Pupils are equal, round, and reactive to light. Right eye exhibits no discharge. Left eye exhibits no discharge. No scleral icterus.  Neck: Normal range of motion. Neck supple. No JVD present. No tracheal deviation present. No thyromegaly present.  Cardiovascular: Normal rate, regular rhythm and intact distal pulses.   Pulmonary/Chest: Effort normal. No stridor. No respiratory distress. He has no wheezes. He has no rales. He exhibits no tenderness.  Abdominal: Soft. He exhibits no distension. There is no tenderness.  Musculoskeletal: Normal range of motion. He exhibits no edema or tenderness.  Lymphadenopathy:    He has no cervical adenopathy.  Neurological: He is alert and oriented to person, place, and time. He has normal strength. He displays no atrophy and no tremor. He  exhibits normal muscle tone. He displays no seizure activity. GCS eye subscore is 4. GCS verbal subscore is 5. GCS motor subscore is 6.  Pt misses occasional details, but otherwise is able to converse and answer questions in a linear, but delayed manner  Skin: Skin is warm and dry. No rash noted. He is not diaphoretic. No erythema. No pallor.  Psychiatric: Judgment and thought content normal. His affect is blunt. His speech is delayed. He is slowed. Cognition and memory are impaired.  Nursing note and vitals reviewed.   ED Course  Procedures (including critical care time)  I personally reviewed records from patient's recent outside hospitalization, including labs, head CT, MRI brain, and notes from hospitalization including neurology notes and H&P.  I had an extensive discussion with patient and family about the results of his recent hospitalization and how it related to his current medical concerns.   EKG Interpretation   Date/Time:  Wednesday February 08 2015 16:36:28 EDT Ventricular Rate:  51 PR Interval:    QRS Duration: 88 QT Interval:  464 QTC Calculation: 427 R Axis:   66 Text Interpretation:  Junctional rhythm Borderline ST elevation, inferior  leads Artifact in lead(s) I III aVR aVL aVF Sinus rhythm Artifact ST-t  wave abnormality Abnormal ekg Confirmed by Carmin Muskrat  MD [phone]) on  02/08/2015 4:48:54 PM      MDM   Final diagnoses:  Altered mental status, unspecified altered mental status type    Pt recently moved to SNF.  Spouse became ill in May and was hospitalized and recovered.  Pt had worsening mental decline since then.  Recent dx of dementia.  Sundowning, and having intermittent episodes of unresponsiveness w/o overt signs of seizure activity.  Family primarily concerned about lack of explanation of condition from recent outside hospitalization.  Pt was supposed to start on seroquel for symptoms but this was delayed due to insurance authorization issues.   Family has follow up appt with neurology here in 2 weeks, partly to assess efficacy of seroquel.  Pt had brief episode today of "blank staring" at SNF.  Spouse was concerned.  Family did not want workup prior to arrival and discussion.  Pt able to participate in discussion, even mentioned frustration that EMTs acted like "he didn't know what was going on." Pt is up to date on surroundings and many other relevant details with some piecemeal loss of detail.  Hx of vascular dx.  Suspect possible  vascular dementia based on available records and discussion with pt and family.    No acute infectious concerns at this time.  Pt had extensive w/u which appears appropriate.  Doubt acute delirium at this time.  Possible sz disorder vs encephalopathy from prior subtle infarcts or another organic neurodegenerative process.  Pt had generalized slowing on recent EEG.  Family interested in starting Seroquel for pt nighttime distress and sundowning symptoms.  Wrote repeat script in case the original was not available for SNF staff.  Family and patient appreciative of discussion and recs.  They will follow up with pt neurologist as scheduled, and will return for acute concerns otherwise.  Do not want to pursue aggressive therapies in light of pt's condition at this time and are primarily focused on QOL measures and non-invasive Tx as appropriate.  Pt's wife and 2 daughters all have nursing backgrounds and are very involved in his care.  No other concerns at this time.  Pt in NAD, VSS, AF, and no significant MS change while observed in ED.  Patient and family were  given return precautions for dementia and AMS.  Advised on use of medications as applicable.  Advised to return for actely worsening symptoms, inability to take medications, or other acute concerns.  Advised to follow up with neurology in 2 weeks.  Patient and family in agreement with and expressed understanding of follow plan, plan of care, and return precautions.  All  questions answered prior to discharge.  Patient was discharged in stable condition with EMS transport.  Patient care was discussed with my attending, Dr. Vanita Panda.    Hoyle Sauer, MD 02/10/15 5732  Carmin Muskrat, MD 02/13/15 1055

## 2015-02-08 NOTE — ED Notes (Signed)
Pt remains monitored by blood pressure, pulse ox, and 12 lead. pts family remains at bedside.  

## 2015-02-20 ENCOUNTER — Encounter: Payer: Self-pay | Admitting: Neurology

## 2015-02-20 ENCOUNTER — Ambulatory Visit (INDEPENDENT_AMBULATORY_CARE_PROVIDER_SITE_OTHER): Payer: Medicare Other | Admitting: Neurology

## 2015-02-20 VITALS — BP 114/60 | HR 58

## 2015-02-20 DIAGNOSIS — I639 Cerebral infarction, unspecified: Secondary | ICD-10-CM

## 2015-02-20 DIAGNOSIS — F0390 Unspecified dementia without behavioral disturbance: Secondary | ICD-10-CM | POA: Diagnosis not present

## 2015-02-20 DIAGNOSIS — R269 Unspecified abnormalities of gait and mobility: Secondary | ICD-10-CM | POA: Insufficient documentation

## 2015-02-20 DIAGNOSIS — R4182 Altered mental status, unspecified: Secondary | ICD-10-CM

## 2015-02-20 MED ORDER — LEVETIRACETAM 500 MG PO TABS: 500.0000 mg | ORAL_TABLET | Freq: Two times a day (BID) | ORAL | Status: DC

## 2015-02-20 NOTE — Progress Notes (Signed)
Chief Complaint  Patient presents with  . Dementia    MMSE 20/27 - 7 animals (legally blind - unable to complete last three on memory test).  He is here with his wife, daughter and grandson.  Reports seroquel has improved his hallucinations and confusion.  Vesicare has been helpful for his nocturia.  He is now in [employer].  Says he forgot his wife's name this morning and it has upset him. He has been in the hosptial twice, since his last visit,  for TIA events.      PATIENT: Donald Kaiser DOB: [birth year]  Chief Complaint  Patient presents with  . Dementia    MMSE 20/27 - 7 animals (legally blind - unable to complete last three on memory test).  He is here with his wife, daughter and grandson.  Reports seroquel has improved his hallucinations and confusion.  Vesicare has been helpful for his nocturia.  He is now in [employer].  Says he forgot his wife's name this morning and it has upset him. He has been in the hosptial twice, since his last visit,  for TIA events.     HISTORICAL  Donald Kaiser is 81 year old right-handed male, company by her daughter Donald Kaiser, and her son-in-law Donald Kaiser, seen in refer by his primary care physician Dr. Shon Baton for evaluation of memory loss, visual hallucinations, gait difficulty, urinary incontinence.  He had a history of hypertension, diabetes, hyperlipidemia, coronary artery disease, status post CABG surgery, history of lumbar decompression surgery at age 31, chronic low back pain, radiating pain to right lower extremity  He had gradual bilateral visual loss since 2009 due to cataract, glaucoma, macular degenerations, diabetic retinopathy, now he could only see the shadows of person, but could not tell details.  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  He complains of 3 out of 10 constant low back pain, radiating pain to right lower extremity, getting worse per her weight.  He was started on Haldol 1 mg 2 tablets every night, did help his sleep, but seems does not help his hallucinations, before Haldol, he sometimes acting out of his dreams, fell off bed few times  I have personally reviewed CAT scan of the brain in Jan 11 2015: Bifrontal temporal predominant atrophy, periventricular small vessel disease.  UPDATE Feb 20 2015: He is accompanied by his wife, daughter, grandson at today's clinical visit, he was taken to hospital in January 23 2015, after found he was difficulty to be awakened from sleep, not responsive to sternal rub, he has no recollection of the event, he did has the urinary incontinence, is too came about 4-5 hours gradually recovered, similar episode happened in May 2016, November second 2016  I have personally reviewed MRI of the brain October 2016: No acute intracranial infarct or other process identified. Generalized age-related cerebral atrophy. 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 MRI of the brain showed no large vessel disease. EEG was normal.  Family  also reportedseizure-like episode, right extremity become rigid, posturing, was accompanied slurred speech, post event confusion  He is now discharged to nursing home,   REVIEW OF SYSTEMS: Full 14 system review of systems performed and notable only for poor vision, increased gait difficulty,  ALLERGIES: No Known Allergies  HOME MEDICATIONS: Current Outpatient Prescriptions  Medication  Sig Dispense Refill  . albuterol-ipratropium (COMBIVENT) 18-103 MCG/ACT inhaler Inhale 1-2 puffs into the lungs every 4 (four) hours as needed.      [name] aspirin 81 MG tablet Take 81 mg by mouth daily.      [name] desonide (DESOWEN) 0.05 % cream Apply topically as needed.     . Ergocalciferol (VITAMIN D2) 2000 UNITS TABS Take 1 tablet by mouth daily.      . fluocinonide (LIDEX) 0.05 % external solution Apply topically as needed.     . furosemide (LASIX) 40 MG tablet Take 20 mg by mouth as needed.     . haloperidol (HALDOL) 1 MG tablet TAKE TWO TABLET BY MOUTH AT BEDTIME FOR ONE WEEK  1  . insulin NPH-insulin regular (NOVOLIN 70/30) (70-30) 100 UNIT/ML injection Inject into the skin daily with breakfast. 20 units in the morning And 5 units in evening    . memantine (NAMENDA) 5 MG tablet Take 5 mg by mouth at bedtime.  3  . metoprolol (LOPRESSOR) 50 MG tablet Take 50 mg by mouth daily.     . mometasone (ASMANEX 120 METERED DOSES) 220 MCG/INH inhaler Inhale 2 puffs into the lungs daily.     . mometasone (NASONEX) 50 MCG/ACT nasal spray Place 2 sprays into the nose daily. 17 g   . montelukast (SINGULAIR) 10 MG tablet Take 10 mg by mouth daily.      . Multiple Vitamins-Minerals (PRESERVISION AREDS PO) Take 1 capsule by mouth 2 (two) times daily.      . nitroGLYCERIN (NITROSTAT) 0.4 MG SL tablet Place 0.4 mg under the tongue every 5 (five) minutes as needed.      . simvastatin (ZOCOR) 40 MG tablet Take 40 mg by mouth at bedtime.      . tamsulosin (FLOMAX) 0.4 MG CAPS capsule TAKE 1 CAPSULE BY MOUTH EVERY DAY AFTER DINNER  5  . traMADol (ULTRAM) 50 MG tablet Take 50 mg by mouth every 6 (six) hours as needed.    . travoprost, benzalkonium, (TRAVATAN) 0.004 % ophthalmic solution Place 1 drop into both eyes daily.     . traZODone (DESYREL) 50 MG tablet TAKE 1 TABLET BY MOUTH EVERY DAY AT BEDTIME FOR SLEEP  5     PAST MEDICAL HISTORY: Past Medical History  Diagnosis Date  . OSA (obstructive sleep apnea)   .  GERD (gastroesophageal reflux disease)   . Diabetes mellitus type II   . Asthma   . DVT (deep venous thrombosis) (HCC)     chronic  . Post-phlebitic syndrome   . Polycythemia vera(238.4)     Ennever  . COPD (chronic obstructive pulmonary disease) ([place])   . Hypertension   . Tremors of nervous system   . Gait difficulty   . Memory loss   . Coronary artery disease   . Arthritis     OA    PAST SURGICAL HISTORY: Past Surgical History  Procedure Laterality Date  . Tonsillectomy    . Artrial bypass      x 5  . Back surgery    . Broken leg surgery    . Coronary artery bypass graft  FAMILY HISTORY: Family History  Problem Relation Age of Onset  . Emphysema Sister   . Allergies Sister   . Asthma Sister   . Heart disease Brother   . Heart disease Brother   . Rheum arthritis Sister   . Rheum arthritis Mother   . Cancer Mother   . Cancer Sister   . Kidney failure Father   . Stroke Sister     SOCIAL HISTORY:  Social History   Social History  . Marital Status: Married    Spouse Name: N/A  . Number of Children: 4  . Years of Education: Masters   Occupational History  . Retired     Company secretary   Social History Main Topics  . Smoking status: Never Smoker   . Smokeless tobacco: Never Used     Comment: never used tobaccco  . Alcohol Use: No  . Drug Use: No  . Sexual Activity: Not on file   Other Topics Concern  . Not on file   Social History Narrative   Lives at home with his wife and his daughter, Donald Kaiser.   Right-handed.   Occasional caffeine use.     PHYSICAL EXAM   Filed Vitals:   02/20/15 0852  BP: 114/60  Pulse: 58    Not recorded      There is no weight on file to calculate BMI.  PHYSICAL EXAMNIATION:  Gen: NAD, conversant, well nourised, obese, well groomed                     Cardiovascular: Regular rate rhythm, no peripheral edema, warm, nontender. Eyes: Conjunctivae clear without exudates or hemorrhage Neck: Supple, no carotid  bruise. Pulmonary: Clear to auscultation bilaterally   NEUROLOGICAL EXAM:  MENTAL STATUS: Speech:    Speech is normal; fluent and spontaneous with normal comprehension.  Cognition: Mini-Mental Status Examination was 20 out of 27 animal naming was 7      Orientation to time, place and person: He is not oriented to date, day, Dr.     Recent and remote memory: He missed 2 out of 3 recalls     Attention span and concentration: He has difficulty spelling world backwards     Normal Language, naming, repeating,spontaneous speech       CRANIAL NERVES: CN II: Pupils were small irregular sluggish reactive to light. Poor vision visual acuity 20 out of 800  CN III, IV, VI: extraocular movement are normal. No ptosis. CN V: Facial sensation is intact to pinprick in all 3 divisions bilaterally. Corneal responses are intact.  CN VII: Face is symmetric with normal eye closure and smile. CN VIII: Hearing is normal to rubbing fingers CN IX, X: Palate elevates symmetrically. Phonation is normal. CN XI: Head turning and shoulder shrug are intact CN XII: Tongue is midline with normal movements and no atrophy.  MOTOR: Normal muscle tone and bulk, he has  moderate bilateral ankle dorsiflexion weakness, right worse than left  REFLEXES: Reflexes are 1 and symmetric at the biceps, triceps, absent at knees, and ankles. Plantar responses are flexor.  SENSORY: Length dependent sensory changes  COORDINATION: Rapid alternating movements and fine finger movements are intact. There is no dysmetria on finger-to-nose and heel-knee-shin.    GAIT/STANCE: He needs 2 people assistant to get up from seated position, difficulty initiate gait, significant right foot drop.   DIAGNOSTIC DATA (LABS, IMAGING, TESTING) - I reviewed patient records, labs, notes, testing and imaging myself where available.   ASSESSMENT AND PLAN  BESSIE VALDIVIA is a 81 y.o. male   Recurrent episodes of seizure-like activity,  prolonged loss of consciousness  Add on Keppra 500 mg twice a day, Bilateral visual loss, visual hallucinations  His hallucination are likely due to his visual loss, and central nervous system degenerative disorder  Continue seroquel 25 mg 1-2 tablets every night Memory loss  Continue Namenda 10 mg twice a day Gait difficulty  Multifactorial, this is due to combination of low back pain, lumbar radiculopathy, deconditioning, poor vision,  Right ankle brace, manual wheelchair  Urinary incontinence   Most likely related to his lumbar stenosis    Vesicare for frequent nocturia    Marcial Pacas, M.D. Ph.D.  [facility] [address], [place], [place] [address] Ph: [phone] Fax: [phone]  CC: Dr. Shon Baton

## 2015-02-22 ENCOUNTER — Telehealth: Payer: Self-pay | Admitting: Neurology

## 2015-02-22 NOTE — Telephone Encounter (Signed)
Daria with [facility] called sts referral sent over did not have Dr [name] NPI #. Please write NPI under Dr [name] on RX and fax back. (this was for standard wheelchair) Fax# [phone] and phone [phone]

## 2015-02-22 NOTE — Telephone Encounter (Signed)
Order updated and faxed again.

## 2015-02-22 NOTE — Telephone Encounter (Signed)
[facility] called back and states the NPI needs to under Dr. Rhea Belton name. It is still missing. RN was contacted and it was re-faxed with NPI #

## 2015-02-22 NOTE — Telephone Encounter (Signed)
Updated and faxed again.

## 2015-03-29 ENCOUNTER — Telehealth: Payer: Self-pay | Admitting: Neurology

## 2015-03-29 NOTE — Telephone Encounter (Signed)
Lattie Haw with [facility] [employer] [phone] opt 5  Member # [id number] called. The medical staff is inquiring if pt has dementia related psychosis, if yes is dementia related psychosis is it determined to be severe or associated with agitation, combativeness or violent behavior which puts the pt or others in danger. Has the provider documented that they have discussed the risk of increased immortality with the pt or the pt surrogate decision maker.

## 2015-03-29 NOTE — Telephone Encounter (Signed)
Is there anything I need to do?

## 2015-03-29 NOTE — Telephone Encounter (Signed)
Donald Kaiser with [facility] [employer] called and states QUEtiapine (SEROQUEL) 25 MG tablet has been approved from 03/29/15 to 03/28/16. May call [phone]

## 2015-03-29 NOTE — Telephone Encounter (Signed)
[facility] called for prior auth on QUEtiapine (SEROQUEL) 25 MG tablet. Thank you

## 2015-03-29 NOTE — Telephone Encounter (Signed)
It does not appear we have the prescription benefit info on file.  I called the pharmacy and spoke with Donald Kaiser, who kindly provided ins info.  [employer] ID # [id number] [id number].  Ins has been contacted and provided with clinical info.  Request is currently under review Ref # [id number]

## 2015-03-29 NOTE — Telephone Encounter (Signed)
I called back .  Spoke with Gerald Stabs.  He was not able to assist and transferred me to [place].  We went over clinical info.  Says they have everything needed.

## 2015-03-30 NOTE — Telephone Encounter (Signed)
Sorry Dr Felecia Shelling.  There is nothing you need to do.  I'm not sure why the operator sent you this message.

## 2015-04-11 ENCOUNTER — Other Ambulatory Visit: Payer: Self-pay | Admitting: Neurology

## 2015-04-11 MED ORDER — LEVETIRACETAM 500 MG PO TABS: 500.0000 mg | ORAL_TABLET | Freq: Two times a day (BID) | ORAL | Status: DC

## 2015-04-11 MED ORDER — QUETIAPINE FUMARATE 25 MG PO TABS: 25.0000 mg | ORAL_TABLET | Freq: Every day | ORAL | Status: DC

## 2015-04-11 MED ORDER — SOLIFENACIN SUCCINATE 5 MG PO TABS: 5.0000 mg | ORAL_TABLET | Freq: Every day | ORAL | Status: DC

## 2015-04-11 NOTE — Telephone Encounter (Signed)
Request entered, forwarded to provider for review.  

## 2015-04-11 NOTE — Telephone Encounter (Signed)
Pt's daughter called and says because pt has benefits through the [place] they need new Rx's faxed to their office for solifenacin (VESICARE) 5 MG tablet, QUEtiapine (SEROQUEL) 25 MG tablet, levETIRAcetam (KEPPRA) 500 MG tablet. Please fax to [phone] , Dr. Ihor Dow.

## 2015-05-08 ENCOUNTER — Emergency Department (HOSPITAL_COMMUNITY)
Admission: EM | Admit: 2015-05-08 | Discharge: 2015-05-08 | Disposition: A | Discharge status: Home / Self Care | Payer: Medicare Other | Attending: Emergency Medicine | Admitting: Emergency Medicine

## 2015-05-08 ENCOUNTER — Emergency Department (HOSPITAL_COMMUNITY): Payer: Medicare Other

## 2015-05-08 ENCOUNTER — Encounter (HOSPITAL_COMMUNITY): Payer: Self-pay | Admitting: *Deleted

## 2015-05-08 DIAGNOSIS — I10 Essential (primary) hypertension: Secondary | ICD-10-CM | POA: Insufficient documentation

## 2015-05-08 DIAGNOSIS — Z79899 Other long term (current) drug therapy: Secondary | ICD-10-CM | POA: Diagnosis not present

## 2015-05-08 DIAGNOSIS — Y9289 Other specified places as the place of occurrence of the external cause: Secondary | ICD-10-CM | POA: Insufficient documentation

## 2015-05-08 DIAGNOSIS — Y998 Other external cause status: Secondary | ICD-10-CM | POA: Diagnosis not present

## 2015-05-08 DIAGNOSIS — J449 Chronic obstructive pulmonary disease, unspecified: Secondary | ICD-10-CM | POA: Diagnosis not present

## 2015-05-08 DIAGNOSIS — S3992XA Unspecified injury of lower back, initial encounter: Secondary | ICD-10-CM | POA: Diagnosis not present

## 2015-05-08 DIAGNOSIS — K219 Gastro-esophageal reflux disease without esophagitis: Secondary | ICD-10-CM | POA: Insufficient documentation

## 2015-05-08 DIAGNOSIS — Z7984 Long term (current) use of oral hypoglycemic drugs: Secondary | ICD-10-CM | POA: Diagnosis not present

## 2015-05-08 DIAGNOSIS — I2510 Atherosclerotic heart disease of native coronary artery without angina pectoris: Secondary | ICD-10-CM | POA: Insufficient documentation

## 2015-05-08 DIAGNOSIS — W1839XA Other fall on same level, initial encounter: Secondary | ICD-10-CM | POA: Diagnosis not present

## 2015-05-08 DIAGNOSIS — Y9389 Activity, other specified: Secondary | ICD-10-CM | POA: Insufficient documentation

## 2015-05-08 DIAGNOSIS — F0390 Unspecified dementia without behavioral disturbance: Secondary | ICD-10-CM | POA: Diagnosis not present

## 2015-05-08 DIAGNOSIS — Z794 Long term (current) use of insulin: Secondary | ICD-10-CM | POA: Diagnosis not present

## 2015-05-08 DIAGNOSIS — M545 Low back pain, unspecified: Secondary | ICD-10-CM

## 2015-05-08 DIAGNOSIS — Z7951 Long term (current) use of inhaled steroids: Secondary | ICD-10-CM | POA: Diagnosis not present

## 2015-05-08 DIAGNOSIS — Z86718 Personal history of other venous thrombosis and embolism: Secondary | ICD-10-CM | POA: Diagnosis not present

## 2015-05-08 DIAGNOSIS — E119 Type 2 diabetes mellitus without complications: Secondary | ICD-10-CM | POA: Diagnosis not present

## 2015-05-08 DIAGNOSIS — W19XXXA Unspecified fall, initial encounter: Secondary | ICD-10-CM

## 2015-05-08 DIAGNOSIS — J45909 Unspecified asthma, uncomplicated: Secondary | ICD-10-CM | POA: Diagnosis not present

## 2015-05-08 DIAGNOSIS — Z7982 Long term (current) use of aspirin: Secondary | ICD-10-CM | POA: Insufficient documentation

## 2015-05-08 NOTE — Discharge Instructions (Signed)
Dementia Dementia is a general term for problems with brain function. A person with dementia has memory loss and a hard time with at least one other brain function such as thinking, speaking, or problem solving. Dementia can affect social functioning, how you do your job, your mood, or your personality. The changes may be hidden for a long time. The earliest forms of this disease are usually not detected by family or friends. Dementia can be:  Irreversible.  Potentially reversible.  Partially reversible.  Progressive. This means it can get worse over time. CAUSES  Irreversible dementia causes may include:  Degeneration of brain cells (Alzheimer disease or Lewy body dementia).  Multiple small strokes (vascular dementia).  Infection (chronic meningitis or Creutzfeldt-Jakob disease).  Frontotemporal dementia. This affects younger people, age 40 to 70, compared to those who have Alzheimer disease.  Dementia associated with other disorders like Parkinson disease, Huntington disease, or HIV-associated dementia. Potentially or partially reversible dementia causes may include:  Medicines.  Metabolic causes such as excessive alcohol intake, vitamin B12 deficiency, or thyroid disease.  Masses or pressure in the brain such as a tumor, blood clot, or hydrocephalus. SIGNS AND SYMPTOMS  Symptoms are often hard to detect. Family members or coworkers may not notice them early in the disease process. Different people with dementia may have different symptoms. Symptoms can include:  A hard time with memory, especially recent memory. Long-term memory may not be impaired.  Asking the same question multiple times or forgetting something someone just said.  A hard time speaking your thoughts or finding certain words.  A hard time solving problems or performing familiar tasks (such as how to use a telephone).  Sudden changes in mood.  Changes in personality, especially increasing moodiness or  mistrust.  Depression.  A hard time understanding complex ideas that were never a problem in the past. DIAGNOSIS  There are no specific tests for dementia.   Your health care provider may recommend a thorough evaluation. This is because some forms of dementia can be reversible. The evaluation will likely include a physical exam and getting a detailed history from you and a family member. The history often gives the best clues and suggestions for a diagnosis.  Memory testing may be done. A detailed brain function evaluation called neuropsychologic testing may be helpful.  Lab tests and brain imaging (such as a CT scan or MRI scan) are sometimes important.  Sometimes observation and re-evaluation over time is very helpful. TREATMENT  Treatment depends on the cause.   If the problem is a vitamin deficiency, it may be helped or cured with supplements.  For dementias such as Alzheimer disease, medicines are available to stabilize or slow the course of the disease. There are no cures for this type of dementia.  Your health care provider can help direct you to groups, organizations, and other health care providers to help with decisions in the care of you or your loved one. HOME CARE INSTRUCTIONS The care of individuals with dementia is varied and dependent upon the progression of the dementia. The following suggestions are intended for the person living with, or caring for, the person with dementia.  Create a safe environment.  Remove the locks on bathroom doors to prevent the person from accidentally locking himself or herself in.  Use childproof latches on kitchen cabinets and any place where cleaning supplies, chemicals, or alcohol are kept.  Use childproof covers in unused electrical outlets.  Install childproof devices to keep doors and windows   secured.  Remove stove knobs or install safety knobs and an automatic shut-off on the stove.  Lower the temperature on water  heaters.  Label medicines and keep them locked up.  Secure knives, lighters, matches, power tools, and guns, and keep these items out of reach.  Keep the house free from clutter. Remove rugs or anything that might contribute to a fall.  Remove objects that might break and hurt the person.  Make sure lighting is good, both inside and outside.  Install grab rails as needed.  Use a monitoring device to alert you to falls or other needs for help.  Reduce confusion.  Keep familiar objects and people around.  Use night lights or dim lights at night.  Label items or areas.  Use reminders, notes, or directions for daily activities or tasks.  Keep a simple, consistent routine for waking, meals, bathing, dressing, and bedtime.  Create a calm, quiet environment.  Place large clocks and calendars prominently.  Display emergency numbers and home address near all telephones.  Use cues to establish different times of the day. An example is to open curtains to let the natural light in during the day.   Use effective communication.  Choose simple words and short sentences.  Use a gentle, calm tone of voice.  Be careful not to interrupt.  If the person is struggling to find a word or communicate a thought, try to provide the word or thought.  Ask one question at a time. Allow the person ample time to answer questions. Repeat the question again if the person does not respond.  Reduce nighttime restlessness.  Provide a comfortable bed.  Have a consistent nighttime routine.  Ensure a regular walking or physical activity schedule. Involve the person in daily activities as much as possible.  Limit napping during the day.  Limit caffeine.  Attend social events that stimulate rather than overwhelm the senses.  Encourage good nutrition and hydration.  Reduce distractions during meal times and snacks.  Avoid foods that are too hot or too cold.  Monitor chewing and swallowing  ability.  Continue with routine vision, hearing, dental, and medical screenings.  Give medicines only as directed by the health care provider.  Monitor driving abilities. Do not allow the person to drive when safe driving is no longer possible.  Register with an identification program which could provide location assistance in the event of a missing person situation. SEEK MEDICAL CARE IF:   New behavioral problems start such as moodiness, aggressiveness, or seeing things that are not there (hallucinations).  Any new problem with brain function happens. This includes problems with balance, speech, or falling a lot.  Problems with swallowing develop.  Any symptoms of other illness happen. Small changes or worsening in any aspect of brain function can be a sign that the illness is getting worse. It can also be a sign of another medical illness such as infection. Seeing a health care provider right away is important. SEEK IMMEDIATE MEDICAL CARE IF:   A fever develops.  New or worsened confusion develops.  New or worsened sleepiness develops.  Staying awake becomes hard to do.   This information is not intended to replace advice given to you by your health care provider. Make sure you discuss any questions you have with your health care provider.   Document Released: 09/18/2000 Document Revised: 04/15/2014 Document Reviewed: 08/20/2010 Elsevier Interactive Patient Education 2016 Elsevier Inc.  Back Pain, Adult Back pain is very common in adults.The  cause of back pain is rarely dangerous and the pain often gets better over time.The cause of your back pain may not be known. Some common causes of back pain include:  Strain of the muscles or ligaments supporting the spine.  Wear and tear (degeneration) of the spinal disks.  Arthritis.  Direct injury to the back. For many people, back pain may return. Since back pain is rarely dangerous, most people can learn to manage this  condition on their own. HOME CARE INSTRUCTIONS Watch your back pain for any changes. The following actions may help to lessen any discomfort you are feeling:  Remain active. It is stressful on your back to sit or stand in one place for long periods of time. Do not sit, drive, or stand in one place for more than 30 minutes at a time. Take short walks on even surfaces as soon as you are able.Try to increase the length of time you walk each day.  Exercise regularly as directed by your health care provider. Exercise helps your back heal faster. It also helps avoid future injury by keeping your muscles strong and flexible.  Do not stay in bed.Resting more than 1-2 days can delay your recovery.  Pay attention to your body when you bend and lift. The most comfortable positions are those that put less stress on your recovering back. Always use proper lifting techniques, including:  Bending your knees.  Keeping the load close to your body.  Avoiding twisting.  Find a comfortable position to sleep. Use a firm mattress and lie on your side with your knees slightly bent. If you lie on your back, put a pillow under your knees.  Avoid feeling anxious or stressed.Stress increases muscle tension and can worsen back pain.It is important to recognize when you are anxious or stressed and learn ways to manage it, such as with exercise.  Take medicines only as directed by your health care provider. Over-the-counter medicines to reduce pain and inflammation are often the most helpful.Your health care provider may prescribe muscle relaxant drugs.These medicines help dull your pain so you can more quickly return to your normal activities and healthy exercise.  Apply ice to the injured area:  Put ice in a plastic bag.  Place a towel between your skin and the bag.  Leave the ice on for 20 minutes, 2-3 times a day for the first 2-3 days. After that, ice and heat may be alternated to reduce pain and  spasms.  Maintain a healthy weight. Excess weight puts extra stress on your back and makes it difficult to maintain good posture. SEEK MEDICAL CARE IF:  You have pain that is not relieved with rest or medicine.  You have increasing pain going down into the legs or buttocks.  You have pain that does not improve in one week.  You have night pain.  You lose weight.  You have a fever or chills. SEEK IMMEDIATE MEDICAL CARE IF:   You develop new bowel or bladder control problems.  You have unusual weakness or numbness in your arms or legs.  You develop nausea or vomiting.  You develop abdominal pain.  You feel faint.   This information is not intended to replace advice given to you by your health care provider. Make sure you discuss any questions you have with your health care provider.   Document Released: 03/25/2005 Document Revised: 04/15/2014 Document Reviewed: 07/27/2013 Elsevier Interactive Patient Education [employer].

## 2015-05-08 NOTE — ED Notes (Signed)
Pt arrives from home via [facility]. Pt fell out of wheelchair. Per EMS pt family denies LOC, or hitting his need. They did state that the pt became diaphoretic during this event. Pt c/o of some lower back pain at this time.

## 2015-05-08 NOTE — ED Provider Notes (Signed)
CSN: [id number]     Arrival date & time 05/08/15  [id number] History   First MD Initiated Contact with Patient 05/08/15 [phone]     Chief Complaint  Patient presents with  . Fall    Level 5 caveat due to dementia Patient is a 82 y.o. male presenting with fall. The history is provided by the patient.  Fall   patient presents with fall. Found by his daughter bent over a chair when she asked if he fell he said yes, however he does have a history of dementia. Complaining of pain in his lower back and possibly pelvis. Family states was depressed in his sacral area he was having pain. He is minimally ambulatory start with. He has a history of dementia and is status post walk on his own but he does have a history of delirium and falls. Reportedly did not complaining of headache and family does not think he hit his head. While the patient's daughter was feeling his back he did briefly become diaphoretic.  Past Medical History  Diagnosis Date  . OSA (obstructive sleep apnea)   . GERD (gastroesophageal reflux disease)   . Diabetes mellitus type II   . Asthma   . DVT (deep venous thrombosis) ([facility])     chronic  . Post-phlebitic syndrome   . Polycythemia vera(238.4)     Ennever  . COPD (chronic obstructive pulmonary disease) ([place])   . Hypertension   . Tremors of nervous system   . Gait difficulty   . Memory loss   . Coronary artery disease   . Arthritis     OA   Past Surgical History  Procedure Laterality Date  . Tonsillectomy    . Artrial bypass      x 5  . Back surgery    . Broken leg surgery    . Coronary artery bypass graft     Family History  Problem Relation Age of Onset  . Emphysema Sister   . Allergies Sister   . Asthma Sister   . Heart disease Brother   . Heart disease Brother   . Rheum arthritis Sister   . Rheum arthritis Mother   . Cancer Mother   . Cancer Sister   . Kidney failure Father   . Stroke Sister    Social History  Substance Use Topics  . Smoking status: Never  Smoker   . Smokeless tobacco: Never Used     Comment: never used tobaccco  . Alcohol Use: No    Review of Systems  Unable to perform ROS: Dementia      Allergies  Review of patient's allergies indicates no known allergies.  Home Medications   Prior to Admission medications   Medication Sig Start Date End Date Taking? Authorizing Provider  albuterol-ipratropium (COMBIVENT) 18-103 MCG/ACT inhaler Inhale 1-2 puffs into the lungs every 4 (four) hours as needed for wheezing or shortness of breath.    Yes Historical Provider, MD  aspirin 81 MG tablet Take 81 mg by mouth daily.     Yes Historical Provider, MD  Ergocalciferol (VITAMIN D2) 2000 UNITS TABS Take 1 tablet by mouth daily.     Yes Historical Provider, MD  furosemide (LASIX) 40 MG tablet Take 0.5 tablets (20 mg total) by mouth daily. 01/21/15  Yes Nita Sells, MD  haloperidol (HALDOL) 2 MG tablet Take 1 tablet (2 mg total) by mouth at bedtime as needed and may repeat dose one time if needed for agitation. 01/23/15  Yes Nita Sells,  MD  insulin glargine (LANTUS) 100 UNIT/ML injection Inject 8 Units into the skin daily.   Yes Historical Provider, MD  levETIRAcetam (KEPPRA) 500 MG tablet Take 1 tablet (500 mg total) by mouth 2 (two) times daily. 04/11/15  Yes Marcial Pacas, MD  memantine (NAMENDA) 5 MG tablet Take 5 mg by mouth at bedtime. 01/14/15  Yes Historical Provider, MD  metFORMIN (GLUCOPHAGE-XR) 500 MG 24 hr tablet Take 500 mg by mouth daily with breakfast.   Yes Historical Provider, MD  metoprolol (LOPRESSOR) 50 MG tablet Take 25 mg by mouth 2 (two) times daily.    Yes Historical Provider, MD  mometasone (ASMANEX 120 METERED DOSES) 220 MCG/INH inhaler Inhale 2 puffs into the lungs daily.    Yes Historical Provider, MD  mometasone (NASONEX) 50 MCG/ACT nasal spray Place 2 sprays into the nose daily. 04/06/13  Yes Elsie Stain, MD  montelukast (SINGULAIR) 10 MG tablet Take 10 mg by mouth daily at 12 noon.    Yes  Historical Provider, MD  Multiple Vitamin (MULTIVITAMIN WITH MINERALS) TABS tablet Take 1 tablet by mouth daily.   Yes Historical Provider, MD  Multiple Vitamins-Minerals (PRESERVISION AREDS PO) Take 1 capsule by mouth 2 (two) times daily.     Yes Historical Provider, MD  polyethylene glycol (MIRALAX / GLYCOLAX) packet Take 17 g by mouth daily. 01/23/15  Yes Nita Sells, MD  QUEtiapine (SEROQUEL) 25 MG tablet Take 1-2 tablets (25-50 mg total) by mouth at bedtime. 04/11/15  Yes Marcial Pacas, MD  simvastatin (ZOCOR) 40 MG tablet Take 1 tablet (40 mg total) by mouth daily. Patient taking differently: Take 40 mg by mouth daily at 6 PM.  01/21/15  Yes Nita Sells, MD  solifenacin (VESICARE) 5 MG tablet Take 1 tablet (5 mg total) by mouth daily. 04/11/15  Yes Marcial Pacas, MD  traMADol (ULTRAM) 50 MG tablet Take 50 mg by mouth every 6 (six) hours as needed for moderate pain or severe pain.    Yes Historical Provider, MD  Travoprost, BAK Free, (TRAVATAN) 0.004 % SOLN ophthalmic solution Place 1 drop into both eyes at bedtime.   Yes Historical Provider, MD  traZODone (DESYREL) 50 MG tablet Take 1 tablet (50 mg total) by mouth at bedtime. 01/23/15  Yes Nita Sells, MD  Amino Acids-Protein Hydrolys (FEEDING SUPPLEMENT, PRO-STAT SUGAR FREE 64,) LIQD Take 30 mLs by mouth 2 (two) times daily.    Historical Provider, MD  fluocinonide (LIDEX) 0.05 % external solution Apply 1 application topically 2 (two) times daily. Patient not taking: Reported on 05/08/2015 01/21/15   Nita Sells, MD  insulin aspart (NOVOLOG) 100 UNIT/ML injection Inject 2-9 Units into the skin 3 (three) times daily before meals. 151-200 = 2 units, 201-250 = 3 units, 251-300 = 5 units, 301-350 = 7 units, 351-400 = 9 units    Historical Provider, MD  nitroGLYCERIN (NITROSTAT) 0.4 MG SL tablet Place 0.4 mg under the tongue every 5 (five) minutes as needed.      Historical Provider, MD  Pollen Extracts (PROSTAT PO) Take 30 mLs  by mouth 2 (two) times daily.    Historical Provider, MD   BP 125/54 mmHg  Pulse 50  Temp(Src) 97.6 F (36.4 C) (Oral)  Resp 13  Ht 5\' 7"  (1.702 m)  Wt 178 lb (80.74 kg)  BMI 27.87 kg/m2  SpO2 97% Physical Exam  Constitutional: He appears well-developed and well-nourished.  HENT:  Head: Atraumatic.  Eyes: Pupils are equal, round, and reactive to light.  Neck: Normal  range of motion. Neck supple.  Cardiovascular: Normal rate.   Pulmonary/Chest: Effort normal.  Abdominal: Soft. There is no tenderness.  Musculoskeletal: He exhibits no edema or tenderness.  No low back or pelvic tenderness, however there is some pain with straight leg raise on the right side. Also pain in his back when he sits up although is not tender.  Neurological: He is alert.  Patient's eyes are open and he will answer some questions but unknown on his appropriateness. Family answers most questions. He will follow commands.    ED Course  Procedures (including critical care time) Labs Review Labs Reviewed - No data to display  Imaging Review Dg Lumbar Spine Complete  05/08/2015  CLINICAL DATA:  [place] this morning.  Back, pelvic and hip pain. EXAM: DG HIP (WITH OR WITHOUT PELVIS) 2-3V RIGHT; SACRUM AND COCCYX - 2+ VIEW; LUMBAR SPINE - COMPLETE 4+ VIEW COMPARISON:  None. FINDINGS: Lumbar spine: Scoliosis and advanced degenerative lumbar spondylosis with multilevel disc disease and facet disease. Surgical changes are noted with laminectomies at L3, L4 and L5. No acute fractures identified. No obvious pars defects. There are moderate atherosclerotic calcifications involving the aorta and iliac arteries but no definite aneurysm. There is a large amount of air throughout the small and large bowel suggesting a diffuse ileus. Extensive phlebolithic calcifications are noted in the pelvis. Sacrum/coccyx: The pubic symphysis is intact. No pubic rami fractures are identified. The SI joints appear normal. No definite sacral  fractures. Pelvis/right hip: Both hips are normally located. No acute hip fracture. No plain film evidence of avascular necrosis. Mild degenerative changes bilaterally. The pubic rami are intact. IMPRESSION: 1. Scoliosis and advanced degenerative lumbar spondylosis but no acute lumbar spine fracture. Remote postoperative changes are noted. 2. No pelvic or hip fractures. 3. Diffuse ileus. Electronically Signed   By: Marijo Sanes M.D.   On: 05/08/2015 10:57   Dg Sacrum/coccyx  05/08/2015  CLINICAL DATA:  [place] this morning.  Back, pelvic and hip pain. EXAM: DG HIP (WITH OR WITHOUT PELVIS) 2-3V RIGHT; SACRUM AND COCCYX - 2+ VIEW; LUMBAR SPINE - COMPLETE 4+ VIEW COMPARISON:  None. FINDINGS: Lumbar spine: Scoliosis and advanced degenerative lumbar spondylosis with multilevel disc disease and facet disease. Surgical changes are noted with laminectomies at L3, L4 and L5. No acute fractures identified. No obvious pars defects. There are moderate atherosclerotic calcifications involving the aorta and iliac arteries but no definite aneurysm. There is a large amount of air throughout the small and large bowel suggesting a diffuse ileus. Extensive phlebolithic calcifications are noted in the pelvis. Sacrum/coccyx: The pubic symphysis is intact. No pubic rami fractures are identified. The SI joints appear normal. No definite sacral fractures. Pelvis/right hip: Both hips are normally located. No acute hip fracture. No plain film evidence of avascular necrosis. Mild degenerative changes bilaterally. The pubic rami are intact. IMPRESSION: 1. Scoliosis and advanced degenerative lumbar spondylosis but no acute lumbar spine fracture. Remote postoperative changes are noted. 2. No pelvic or hip fractures. 3. Diffuse ileus. Electronically Signed   By: Marijo Sanes M.D.   On: 05/08/2015 10:57   Dg Hip Unilat With Pelvis 2-3 Views Right  05/08/2015  CLINICAL DATA:  [place] this morning.  Back, pelvic and hip pain. EXAM: DG HIP (WITH OR  WITHOUT PELVIS) 2-3V RIGHT; SACRUM AND COCCYX - 2+ VIEW; LUMBAR SPINE - COMPLETE 4+ VIEW COMPARISON:  None. FINDINGS: Lumbar spine: Scoliosis and advanced degenerative lumbar spondylosis with multilevel disc disease and facet disease. Surgical changes are  noted with laminectomies at L3, L4 and L5. No acute fractures identified. No obvious pars defects. There are moderate atherosclerotic calcifications involving the aorta and iliac arteries but no definite aneurysm. There is a large amount of air throughout the small and large bowel suggesting a diffuse ileus. Extensive phlebolithic calcifications are noted in the pelvis. Sacrum/coccyx: The pubic symphysis is intact. No pubic rami fractures are identified. The SI joints appear normal. No definite sacral fractures. Pelvis/right hip: Both hips are normally located. No acute hip fracture. No plain film evidence of avascular necrosis. Mild degenerative changes bilaterally. The pubic rami are intact. IMPRESSION: 1. Scoliosis and advanced degenerative lumbar spondylosis but no acute lumbar spine fracture. Remote postoperative changes are noted. 2. No pelvic or hip fractures. 3. Diffuse ileus. Electronically Signed   By: Marijo Sanes M.D.   On: 05/08/2015 10:57   I have personally reviewed and evaluated these images and lab results as part of my medical decision-making.   EKG Interpretation None      MDM   Final diagnoses:  Fall, initial encounter  Midline low back pain without sciatica  Dementia, without behavioral disturbance    Patient with back pain. Had fall. History of dementia. Discussion with family and does not appear to need further workup. Doubt severe fracture. Will discharge home.    Davonna Belling, MD 05/08/15 1630

## 2015-05-23 ENCOUNTER — Encounter: Payer: Self-pay | Admitting: Neurology

## 2015-05-23 ENCOUNTER — Ambulatory Visit (INDEPENDENT_AMBULATORY_CARE_PROVIDER_SITE_OTHER): Payer: Medicare Other | Admitting: Neurology

## 2015-05-23 VITALS — BP 124/65 | HR 58

## 2015-05-23 DIAGNOSIS — R269 Unspecified abnormalities of gait and mobility: Secondary | ICD-10-CM | POA: Diagnosis not present

## 2015-05-23 DIAGNOSIS — R4182 Altered mental status, unspecified: Secondary | ICD-10-CM | POA: Diagnosis not present

## 2015-05-23 DIAGNOSIS — F0390 Unspecified dementia without behavioral disturbance: Secondary | ICD-10-CM

## 2015-05-23 MED ORDER — TOLTERODINE TARTRATE 1 MG PO TABS: 2.0000 mg | ORAL_TABLET | Freq: Every day | ORAL | Status: AC

## 2015-05-23 MED ORDER — DIVALPROEX SODIUM 500 MG PO DR TAB: 500.0000 mg | DELAYED_RELEASE_TABLET | Freq: Two times a day (BID) | ORAL | Status: DC

## 2015-05-23 MED ORDER — DIVALPROEX SODIUM 500 MG PO DR TAB: 500.0000 mg | DELAYED_RELEASE_TABLET | Freq: Two times a day (BID) | ORAL | Status: AC

## 2015-05-23 MED ORDER — TOLTERODINE TARTRATE 1 MG PO TABS: 2.0000 mg | ORAL_TABLET | Freq: Every day | ORAL | Status: DC

## 2015-05-23 NOTE — Progress Notes (Signed)
Chief Complaint  Patient presents with  . Memory Loss    MMSE 16/27 (he is unable to complete last three - legally blind - scored out of 27) - 5 animals.  He is here with his daughter, [name].  His memory is worse. He has completed his rehab at [facility] and is now back at home.  He is getting more confused at night and often gets up out of the bed to wander the house.  He is only taking Seroquel 25mg  at bedtime.  They felt 50mg  at bedtime caused him to be too sleepy the next morning.  . Seizure-like activity    He has had several events of shaking.  His Keppra dose was reduced in rehab and is currently taking Keppra 250mg  in am and 500mg  at bedtime.        PATIENT: Donald Kaiser DOB: April 19, 1926  Chief Complaint  Patient presents with  . Memory Loss    MMSE 16/27 (he is unable to complete last three - legally blind - scored out of 27) - 5 animals.  He is here with his daughter, [name].  His memory is worse. He has completed his rehab at [facility] and is now back at home.  He is getting more confused at night and often gets up out of the bed to wander the house.  He is only taking Seroquel 25mg  at bedtime.  They felt 50mg  at bedtime caused him to be too sleepy the next morning.  . Seizure-like activity    He has had several events of shaking.  His Keppra dose was reduced in rehab and is currently taking Keppra 250mg  in am and 500mg  at bedtime.       HISTORICAL  Donald Kaiser is 82 year old right-handed male, company by her daughter [name], and her son-in-law Barnabas Lister, seen in refer by his primary care physician Dr. Shon Baton for evaluation of memory loss, visual hallucinations, gait difficulty, urinary incontinence.  He had a history of hypertension, diabetes, hyperlipidemia, coronary artery disease, status post CABG surgery, history of lumbar decompression surgery at age 57, chronic low back pain, radiating pain to right lower extremity  He had gradual bilateral visual loss since 2009  due to cataract, glaucoma, macular degenerations, diabetic retinopathy, now he could only see the shadows of person, but could not tell details.  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  But his family reported that sometimes he is confused, thinking that gas is leaking out of his hospital bed rail, he also had a gradually declining functional status, wheelchair dependent, need 2 people assistant to get up take few steps, also complains of constant low back pain, urinary incontinence, frequent nocturia, he also has memory trouble, word finding difficulties.  He complains of 3 out of 10 constant low back pain, radiating pain to right lower extremity, getting worse per her weight.  He was started on Haldol 1 mg 2 tablets every night, did help his sleep, but seems does not help his hallucinations,  he sometimes acting out of his dreams, fell off bed few times  I have personally reviewed CAT scan of the brain in Jan 11 2015: Bifrontal temporal predominant atrophy, periventricular small vessel disease.  UPDATE Feb 20 2015: He is accompanied by his wife, daughter, grandson at today's clinical visit, he was taken to hospital  in January 23 2015, after found he was difficulty to be awakened from sleep, not responsive to sternal rub, he has no recollection of the event, he did has the urinary incontinence, it took him about 4-5 hours to gradually recover, similar episode happened in May 2016, November second 2016  I have personally reviewed MRI of the brain October 2016: No acute intracranial infarct or other process identified. Generalized age-related cerebral atrophy. 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 MRI of the brain showed no large vessel disease. EEG was normal.  Family also reportedseizure-like episode, right  extremity become rigid, posturing, was accompanied slurred speech, post event confusion  He is now discharged to nursing home,  UPDATE May 23 2015: He is back home since Dec 2016, he got up few times during night, upset, "wants to go home", his wife and him lives with their daughter, he took short naps during the day, he walk with walker,  He still has PT/OT twice a week, he is incontinence of urine, had bowel regime. He has good appetite.  While his keppra was cut back to 250mg  bid, he has frequent body jerking movement,  while taking high dose 250/500mg , he has much less jerking movement,  REVIEW OF SYSTEMS: Full 14 system review of systems performed and notable only for poor vision, increased gait difficulty, seizure, back pain, confusion, daytime sleepiness, frequent wakening, leg swelling incontinence of bladder ALLERGIES: No Known Allergies  HOME MEDICATIONS: Current Outpatient Prescriptions  Medication Sig Dispense Refill  . albuterol-ipratropium (COMBIVENT) 18-103 MCG/ACT inhaler Inhale 1-2 puffs into the lungs every 4 (four) hours as needed.      [name] aspirin 81 MG tablet Take 81 mg by mouth daily.      [name] desonide (DESOWEN) 0.05 % cream Apply topically as needed.     . Ergocalciferol (VITAMIN D2) 2000 UNITS TABS Take 1 tablet by mouth daily.      . fluocinonide (LIDEX) 0.05 % external solution Apply topically as needed.     . furosemide (LASIX) 40 MG tablet Take 20 mg by mouth as needed.     . haloperidol (HALDOL) 1 MG tablet TAKE TWO TABLET BY MOUTH AT BEDTIME FOR ONE WEEK  1  . insulin NPH-insulin regular (NOVOLIN 70/30) (70-30) 100 UNIT/ML injection Inject into the skin daily with breakfast. 20 units in the morning And 5 units in evening    . memantine (NAMENDA) 5 MG tablet Take 5 mg by mouth at bedtime.  3  . metoprolol (LOPRESSOR) 50 MG tablet Take 50 mg by mouth daily.     . mometasone (ASMANEX 120 METERED DOSES) 220 MCG/INH inhaler Inhale 2 puffs into the lungs daily.     .  mometasone (NASONEX) 50 MCG/ACT nasal spray Place 2 sprays into the nose daily. 17 g   . montelukast (SINGULAIR) 10 MG tablet Take 10 mg by mouth daily.      . Multiple Vitamins-Minerals (PRESERVISION AREDS PO) Take 1 capsule by mouth 2 (two) times daily.      . nitroGLYCERIN (NITROSTAT) 0.4 MG SL tablet Place 0.4 mg under the tongue every 5 (five) minutes as needed.      . simvastatin (ZOCOR) 40 MG tablet Take 40 mg by mouth at bedtime.      . tamsulosin (FLOMAX) 0.4 MG CAPS capsule TAKE 1 CAPSULE BY MOUTH EVERY DAY AFTER DINNER  5  . traMADol (ULTRAM) 50 MG tablet Take 50 mg by mouth every 6 (six) hours as needed.    [name]  travoprost, benzalkonium, (TRAVATAN) 0.004 % ophthalmic solution Place 1 drop into both eyes daily.     . traZODone (DESYREL) 50 MG tablet TAKE 1 TABLET BY MOUTH EVERY DAY AT BEDTIME FOR SLEEP  5     PAST MEDICAL HISTORY: Past Medical History  Diagnosis Date  . OSA (obstructive sleep apnea)   . GERD (gastroesophageal reflux disease)   . Diabetes mellitus type II   . Asthma   . DVT (deep venous thrombosis) (HCC)     chronic  . Post-phlebitic syndrome   . Polycythemia vera(238.4)     Ennever  . COPD (chronic obstructive pulmonary disease) ([place])   . Hypertension   . Tremors of nervous system   . Gait difficulty   . Memory loss   . Coronary artery disease   . Arthritis     OA    PAST SURGICAL HISTORY: Past Surgical History  Procedure Laterality Date  . Tonsillectomy    . Artrial bypass      x 5  . Back surgery    . Broken leg surgery    . Coronary artery bypass graft      FAMILY HISTORY: Family History  Problem Relation Age of Onset  . Emphysema Sister   . Allergies Sister   . Asthma Sister   . Heart disease Brother   . Heart disease Brother   . Rheum arthritis Sister   . Rheum arthritis Mother   . Cancer Mother   . Cancer Sister   . Kidney failure Father   . Stroke Sister     SOCIAL HISTORY:  Social History   Social History  . Marital  Status: Married    Spouse Name: N/A  . Number of Children: 4  . Years of Education: Masters   Occupational History  . Retired     Company secretary   Social History Main Topics  . Smoking status: Never Smoker   . Smokeless tobacco: Never Used     Comment: never used tobaccco  . Alcohol Use: No  . Drug Use: No  . Sexual Activity: Not on file   Other Topics Concern  . Not on file   Social History Narrative   Lives at home with his wife and his daughter, [name].   Right-handed.   Occasional caffeine use.     PHYSICAL EXAM   Filed Vitals:   05/23/15 0934  BP: 124/65  Pulse: 58    Not recorded      There is no weight on file to calculate BMI.  PHYSICAL EXAMNIATION:  Gen: NAD, conversant, well nourised, obese, well groomed                     Cardiovascular: Regular rate rhythm, no peripheral edema, warm, nontender. Eyes: Conjunctivae clear without exudates or hemorrhage Neck: Supple, no carotid bruise. Pulmonary: Clear to auscultation bilaterally   NEUROLOGICAL EXAM:  MENTAL STATUS: Speech:    Speech is normal; fluent and spontaneous with normal comprehension.  Cognition: Mini-Mental Status Examination was 16 out of 27 animal naming was 5      Orientation to time, place and person: He is not oriented to date, day, Dr.     Recent and remote memory: He missed 3 out of 3 recalls     Attention span and concentration: He could not spell world backwards     Normal Language, naming, repeating,spontaneous speech       CRANIAL NERVES: CN II: Pupils were small irregular sluggish reactive to  light. Poor vision visual acuity 20 out of 800  CN III, IV, VI: extraocular movement are normal. No ptosis. CN V: Facial sensation is intact to pinprick in all 3 divisions bilaterally. Corneal responses are intact.  CN VII: Face is symmetric with normal eye closure and smile. CN VIII: Hearing is normal to rubbing fingers CN IX, X: Palate elevates symmetrically. Phonation is normal. CN  XI: Head turning and shoulder shrug are intact CN XII: Tongue is midline with normal movements and no atrophy.  MOTOR: Normal muscle tone and bulk, he has  moderate bilateral ankle dorsiflexion weakness, right worse than left  REFLEXES: Reflexes are hypoactive and symmetric at the biceps, triceps, absent at knees, and ankles. Plantar responses are flexor.  SENSORY: Length dependent sensory changes  COORDINATION: Rapid alternating movements and fine finger movements are intact. There is no dysmetria on finger-to-nose and heel-knee-shin.    GAIT/STANCE: He needs 2 people assistant to get up from seated position, difficulty initiate gait, wearing right ankle brace,   DIAGNOSTIC DATA (LABS, IMAGING, TESTING) - I reviewed patient records, labs, notes, testing and imaging myself where available.   ASSESSMENT AND PLAN  CASETON HALABY is a 82 y.o. male   Recurrent episodes of seizure-like activity, prolonged loss of consciousness  Gradually tapering off Keppra  Add on Depakote 500 mg twice a day, Bilateral visual loss, visual hallucinations  His hallucination are likely due to his visual loss, and central nervous system degenerative disorder  Continue seroquel 25 mg 1-2 tablets every night Memory loss  Continue Namenda 10 mg twice a day Gait difficulty  Multifactorial, this is due to combination of low back pain, lumbar radiculopathy, deconditioning, poor vision,  Right ankle brace, manual wheelchair  Urinary incontinence   Most likely related to his lumbar stenosis   Vesicare for frequent nocturia has been very helpful, but unfortunately his insurance will no longer cover it,  Will add on Detrol 2 mg every night   Marcial Pacas, M.D. Ph.D.  [facility] [address] [place], [place] [address] Ph: [phone] Fax: [phone]  CC: Dr. Shon Baton

## 2015-05-23 NOTE — Patient Instructions (Signed)
Add on Depakote 500 mg twice a day, gradually weaning him off Keppra  First week: Depakote 500 mg 1 in the morning, 1 at nighttime,  Keppra 250 mg in the morning, 250 mg at night Second week: Depakote 500 mg 1 in the morning, 1 at nighttime,  decrease Keppra to 0 in the morning, 250 mg at nighttime Third week, keep Depakote 500 mg twice a day, stopped Keppra,   May increase seroquel 25 mg, 1-2 tablets every night, may even consider 1 and a half tablets for agitations.

## 2015-07-03 ENCOUNTER — Other Ambulatory Visit: Payer: Medicare Other

## 2015-07-03 ENCOUNTER — Ambulatory Visit: Payer: Medicare Other | Admitting: Hematology & Oncology

## 2015-08-22 ENCOUNTER — Encounter: Payer: Self-pay | Admitting: Neurology

## 2015-08-22 ENCOUNTER — Ambulatory Visit (INDEPENDENT_AMBULATORY_CARE_PROVIDER_SITE_OTHER): Payer: Medicare Other | Admitting: Neurology

## 2015-08-22 VITALS — BP 106/60 | HR 60

## 2015-08-22 DIAGNOSIS — M545 Low back pain, unspecified: Secondary | ICD-10-CM

## 2015-08-22 DIAGNOSIS — R569 Unspecified convulsions: Secondary | ICD-10-CM | POA: Insufficient documentation

## 2015-08-22 DIAGNOSIS — G8929 Other chronic pain: Secondary | ICD-10-CM | POA: Diagnosis not present

## 2015-08-22 DIAGNOSIS — R269 Unspecified abnormalities of gait and mobility: Secondary | ICD-10-CM | POA: Diagnosis not present

## 2015-08-22 DIAGNOSIS — F0390 Unspecified dementia without behavioral disturbance: Secondary | ICD-10-CM

## 2015-08-22 MED ORDER — QUETIAPINE FUMARATE 25 MG PO TABS: 25.0000 mg | ORAL_TABLET | Freq: Every day | ORAL | Status: AC

## 2015-08-22 MED ORDER — SOLIFENACIN SUCCINATE 5 MG PO TABS: 5.0000 mg | ORAL_TABLET | Freq: Every day | ORAL | Status: AC

## 2015-08-22 NOTE — Progress Notes (Signed)
Chief Complaint  Patient presents with  . Dementia    MMSE  13/27 (unable to complete last three due to poor eyesight) - 6 animals.  He is here with his wife, Donald Kaiser and his daughter, Donald Kaiser.  Feels memory to be about the same.  Reports he is sleeping well with Seroquel.  . Seizure-like activity    He is now off Keppra and only taking Depakote 500mg  twice daily.  Feels his jerking episodes are better.      PATIENT: Donald Kaiser DOB: September 07, 1926  Chief Complaint  Patient presents with  . Dementia    MMSE  13/27 (unable to complete last three due to poor eyesight) - 6 animals.  He is here with his wife, Donald Kaiser and his daughter, Donald Kaiser.  Feels memory to be about the same.  Reports he is sleeping well with Seroquel.  . Seizure-like activity    He is now off Keppra and only taking Depakote 500mg  twice daily.  Feels his jerking episodes are better.     HISTORICAL  Donald Kaiser is 82 year old right-handed male, accompanied by her daughter Donald Kaiser, and her son-in-law Donald Kaiser, seen in refer by his primary care physician Dr. Shon Baton for evaluation of memory loss, visual hallucinations, gait difficulty, urinary incontinence in Oct 2016.  He had a history of hypertension, diabetes, hyperlipidemia, coronary artery disease, status post CABG surgery, history of lumbar decompression surgery at age 45, chronic low back pain, radiating pain to right lower extremity  He had gradual onset bilateral visual loss since 2009 due to cataract, glaucoma, macular degenerations, diabetic retinopathy, now he could only see the shadows of person, but could not tell details.  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  But his family reported that sometimes he is confused, thinking that gas is leaking out of his hospital bed rail, he also had a  gradually declining functional status, wheelchair dependent, need 2 people assistant to get up take few steps, also complains of constant low back pain, urinary incontinence, frequent nocturia, he also has memory trouble, word finding difficulties.  He complains of 3 out of 10 constant low back pain, radiating pain to right lower extremity, getting worse after bearing weight.  He was started on Haldol 1 mg 2 tablets every night, which did help his sleep, but did not help his hallucinations,  he sometimes acting out of his dreams, fell off bed few times  I have personally reviewed CAT scan of the brain in Jan 11 2015: Bifrontal temporal predominant atrophy, periventricular small vessel disease.  UPDATE Feb 20 2015: He is accompanied by his wife, daughter, grandson at today's clinical visit, he was taken to hospital in January 23 2015, after found he was difficulty to be awakened from sleep, not responsive to sternal rub, he has no recollection of the event, he did have  urinary incontinence, it took him about 4-5 hours to gradually recover, similar episode happened in May 2016, November second 2016  I have personally reviewed MRI of the brain October 2016: No acute intracranial infarct or other process identified. Generalized cerebral atrophy.   MRA of the brain showed no large vessel disease. EEG was normal.  Family also reportedseizure-like episode, right extremity become rigid, posturing, was accompanied slurred speech, post event confusion, he was started on Keppra 500mg  bid  He  is now discharged to nursing home,  UPDATE May 23 2015: He is back home since Dec 2016, he got up few times during night, upset, "wants to go home", his wife and him lives with their daughter, he took short naps during the day, he walk with walker,  He still has PT/OT twice a week, he is incontinence of urine, had bowel regime. He has good appetite.  While his keppra was cut back to 250mg  bid, he has frequent body  jerking movement,  while taking high dose 250/500mg , he has much less jerking movement  UPDATE May 16th 2017: He is with his daughter and wife at today's clinical visit, he has been doing very well over the past few months, he has stopped taking Keppra, now on Depakote dr 500 mg twice a day, there was no recurrent seizure, he is also taking seroquel 25 mg every night, trazodone 50 mg every night, Vesicare 5 mg every night, he still gets up 3-4 times every night, using bathroom, he has significant gait difficulty, complains of moderate to severe low back pain,   REVIEW OF SYSTEMS: Full 14 system review of systems performed and notable only for memory loss, dizziness, speech difficulty, confusion, back pain, walking difficulty   Allergies:  No Known Allergies  HOME MEDICATIONS: Current Outpatient Prescriptions  Medication Sig Dispense Refill  . albuterol-ipratropium (COMBIVENT) 18-103 MCG/ACT inhaler Inhale 1-2 puffs into the lungs every 4 (four) hours as needed.      [name] aspirin 81 MG tablet Take 81 mg by mouth daily.      [name] desonide (DESOWEN) 0.05 % cream Apply topically as needed.     . Ergocalciferol (VITAMIN D2) 2000 UNITS TABS Take 1 tablet by mouth daily.      . fluocinonide (LIDEX) 0.05 % external solution Apply topically as needed.     . furosemide (LASIX) 40 MG tablet Take 20 mg by mouth as needed.     . haloperidol (HALDOL) 1 MG tablet TAKE TWO TABLET BY MOUTH AT BEDTIME FOR ONE WEEK  1  . insulin NPH-insulin regular (NOVOLIN 70/30) (70-30) 100 UNIT/ML injection Inject into the skin daily with breakfast. 20 units in the morning And 5 units in evening    . memantine (NAMENDA) 5 MG tablet Take 5 mg by mouth at bedtime.  3  . metoprolol (LOPRESSOR) 50 MG tablet Take 50 mg by mouth daily.     . mometasone (ASMANEX 120 METERED DOSES) 220 MCG/INH inhaler Inhale 2 puffs into the lungs daily.     . mometasone (NASONEX) 50 MCG/ACT nasal spray Place 2 sprays into the nose daily. 17 g   .  montelukast (SINGULAIR) 10 MG tablet Take 10 mg by mouth daily.      . Multiple Vitamins-Minerals (PRESERVISION AREDS PO) Take 1 capsule by mouth 2 (two) times daily.      . nitroGLYCERIN (NITROSTAT) 0.4 MG SL tablet Place 0.4 mg under the tongue every 5 (five) minutes as needed.      . simvastatin (ZOCOR) 40 MG tablet Take 40 mg by mouth at bedtime.      . tamsulosin (FLOMAX) 0.4 MG CAPS capsule TAKE 1 CAPSULE BY MOUTH EVERY DAY AFTER DINNER  5  . traMADol (ULTRAM) 50 MG tablet Take 50 mg by mouth every 6 (six) hours as needed.    . travoprost, benzalkonium, (TRAVATAN) 0.004 % ophthalmic solution Place 1 drop into both eyes daily.     . traZODone (DESYREL) 50 MG tablet TAKE 1 TABLET  BY MOUTH EVERY DAY AT BEDTIME FOR SLEEP  5     PAST MEDICAL HISTORY: Past Medical History  Diagnosis Date  . OSA (obstructive sleep apnea)   . GERD (gastroesophageal reflux disease)   . Diabetes mellitus type II   . Asthma   . DVT (deep venous thrombosis) (HCC)     chronic  . Post-phlebitic syndrome   . Polycythemia vera(238.4)     Ennever  . COPD (chronic obstructive pulmonary disease) ([place])   . Hypertension   . Tremors of nervous system   . Gait difficulty   . Memory loss   . Coronary artery disease   . Arthritis     OA    PAST SURGICAL HISTORY: Past Surgical History  Procedure Laterality Date  . Tonsillectomy    . Artrial bypass      x 5  . Back surgery    . Broken leg surgery    . Coronary artery bypass graft      FAMILY HISTORY: Family History  Problem Relation Age of Onset  . Emphysema Sister   . Allergies Sister   . Asthma Sister   . Heart disease Brother   . Heart disease Brother   . Rheum arthritis Sister   . Rheum arthritis Mother   . Cancer Mother   . Cancer Sister   . Kidney failure Father   . Stroke Sister     SOCIAL HISTORY:  Social History   Social History  . Marital Status: Married    Spouse Name: N/A  . Number of Children: 4  . Years of Education:  Masters   Occupational History  . Retired     Company secretary   Social History Main Topics  . Smoking status: Never Smoker   . Smokeless tobacco: Never Used     Comment: never used tobaccco  . Alcohol Use: No  . Drug Use: No  . Sexual Activity: Not on file   Other Topics Concern  . Not on file   Social History Narrative   Lives at home with his wife and his daughter, Donald Kaiser.   Right-handed.   Occasional caffeine use.     PHYSICAL EXAM   Filed Vitals:   08/22/15 0936  BP: 106/60  Pulse: 60    Not recorded      There is no weight on file to calculate BMI.  PHYSICAL EXAMNIATION:  Gen: NAD, conversant, well nourised, obese, well groomed                     Cardiovascular: Regular rate rhythm, no peripheral edema, warm, nontender. Eyes: Conjunctivae clear without exudates or hemorrhage Neck: Supple, no carotid bruise. Pulmonary: Clear to auscultation bilaterally   NEUROLOGICAL EXAM:  MENTAL STATUS: Speech:    Speech is normal; fluent and spontaneous with normal comprehension.  Cognition: Mini-Mental Status Examination was  13 out of 27 animal naming was 6     Orientation to time, place and person: He is not oriented to date, year,  day, Dr.Clinic     Recent and remote memory: He missed 3 out of 3 recalls     Attention span and concentration: He could not spell world backwards     Normal Language, naming, repeating,spontaneous speech       CRANIAL NERVES: CN II: Pupils were small irregular sluggish reactive to light, Legally blind, could only count fingers CN III, IV, VI: extraocular movement are normal. No ptosis. CN V: Facial sensation is intact to pinprick  in all 3 divisions bilaterally. Corneal responses are intact.  CN VII: Face is symmetric with normal eye closure and smile. CN VIII: Hearing is normal to rubbing fingers CN IX, X: Palate elevates symmetrically. Phonation is normal. CN XI: Head turning and shoulder shrug are intact CN XII: Tongue is midline  with normal movements and no atrophy.  MOTOR: Normal muscle tone and bulk, he has  moderate bilateral ankle dorsiflexion weakness, right worse than left, Wear right ankle brace   REFLEXES: Reflexes are hypoactive and symmetric at the biceps, triceps, absent at knees, and ankles. Plantar responses are flexor.  SENSORY: Length dependent sensory changes  COORDINATION: There is no dysmetria on finger-to-nose and heel-knee-shin.    GAIT/STANCE: He needs 2 people assistant to get up from seated position, difficulty initiate gait, wearing right ankle brace,   DIAGNOSTIC DATA (LABS, IMAGING, TESTING) - I reviewed patient records, labs, notes, testing and imaging myself where available.   ASSESSMENT AND PLAN  Donald Kaiser is a 82 y.o. male   Recurrent episodes of seizure-like activity, prolonged loss of consciousness  Keep  Depakote 500 mg DR twice a day,  Bilateral visual loss, visual hallucinations  His hallucination are likely due to his visual loss, and central nervous system degenerative disorder  Continue seroquel 25 mg every night  Memoryss  Continue Namenda 5 once every night  Gait abnormality  Multifactorial, this is due to combination of low back pain, lumbar radiculopathy, deconditioning, poor vision,  Right ankle brace, manual wheelchair  Urinary incontinence   Most likely related to his lumbar stenosis   Vesicare has helped his nocturia  Chronic low back pain  I have suggested heating pad, back brace, he is using tramadol as needed  Marcial Pacas, M.D. Ph.D.  [facility] [address] [place], [place] [address] Ph: [phone] Fax: [phone]  CC: Dr. Shon Baton

## 2015-08-23 ENCOUNTER — Encounter: Payer: Self-pay | Admitting: *Deleted

## 2015-11-22 ENCOUNTER — Telehealth: Payer: Self-pay | Admitting: Neurology

## 2015-11-22 NOTE — Telephone Encounter (Signed)
Pt's daughter called in to cancel appt and wanted to let Dr. [name] know pt passed away this past 2022/07/29. She said thank you for all that she did to help her father.

## 2015-11-23 ENCOUNTER — Telehealth: Payer: Self-pay | Admitting: *Deleted

## 2015-11-23 NOTE — Telephone Encounter (Signed)
Received notification from patient daughter that patient passed away .  Unsure of the date.   Dr. [name] notified.

## 2015-12-08 DEATH — deceased

## 2015-12-21 ENCOUNTER — Telehealth: Payer: Self-pay | Admitting: Neurology

## 2015-12-21 NOTE — Telephone Encounter (Signed)
Pts wife called to notify the office pt passed away in 11-16-22. She wants Dr. [name] to know she appreciates everything that was done for her husband.

## 2016-02-22 ENCOUNTER — Ambulatory Visit: Payer: Medicare Other | Admitting: Neurology

## 2017-06-29 IMAGING — MR MR HEAD W/O CM
9 of 11 series · 30 of 48 positions shown · non-contrast
Comparison: Prior CT from earlier the same day.

CLINICAL DATA: Initial evaluation for acute altered mental status,
weakness, right-sided facial droop. Evaluate for stroke.

EXAM:
MRI HEAD WITHOUT CONTRAST
MRA HEAD WITHOUT CONTRAST
TECHNIQUE: Multiplanar, multiecho pulse sequences of the brain and surrounding
structures were obtained without intravenous contrast. Angiographic
images of the head were obtained using MRA technique without
contrast.

[Series 3: DWI · axial · 3.0mm · 1.09mm/px · z∈[-52,+71]mm · 7 of 84 slices shown (1 of 4)]
[im 1/84]
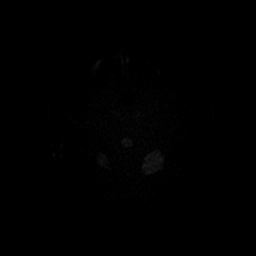
[im 14/84]
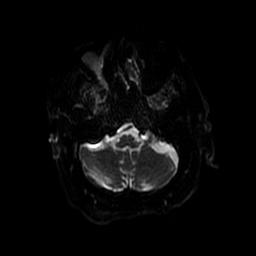
[im 28/84]
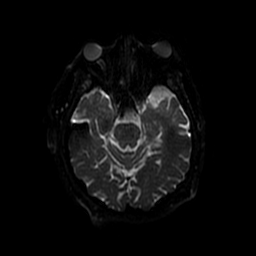
[im 42/84]
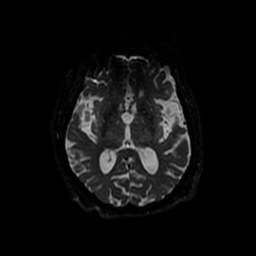
[im 56/84]
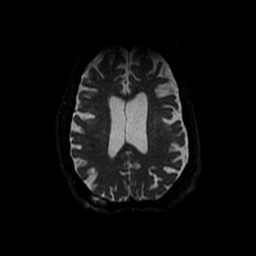
[im 70/84]
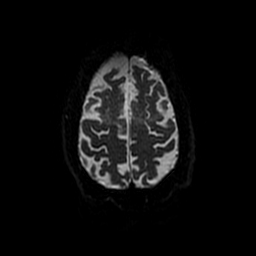
[im 84/84]
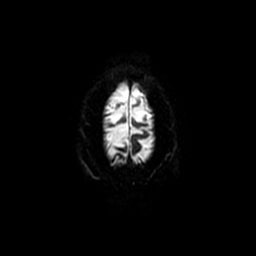

[Series 5: DWI · coronal · 5.0mm · 1.09mm/px · 5 of 66 slices shown (2 of 4)]
[im 1/66]
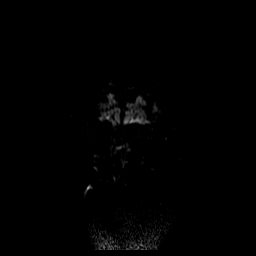
[im 17/66]
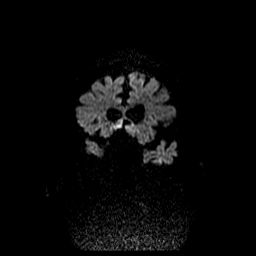
[im 33/66]
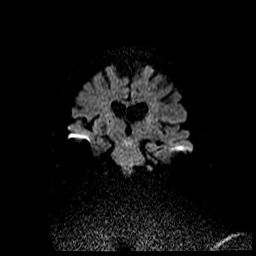
[im 49/66]
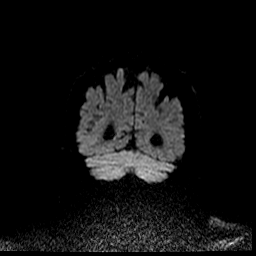
[im 66/66]
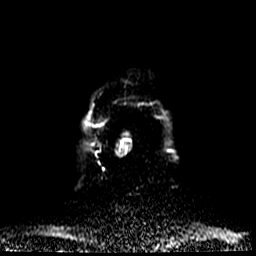

[Series 6: (id) mt fs · axial · 1.4mm · 0.43mm/px · z∈[-72,-23]mm · 4 of 154 slices shown]
[im 1/154]
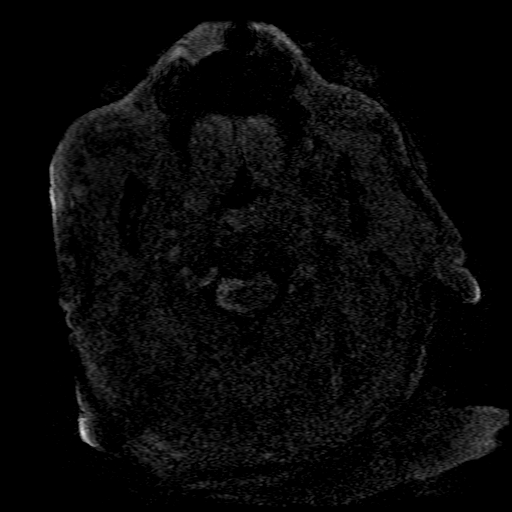
[im 28/154]
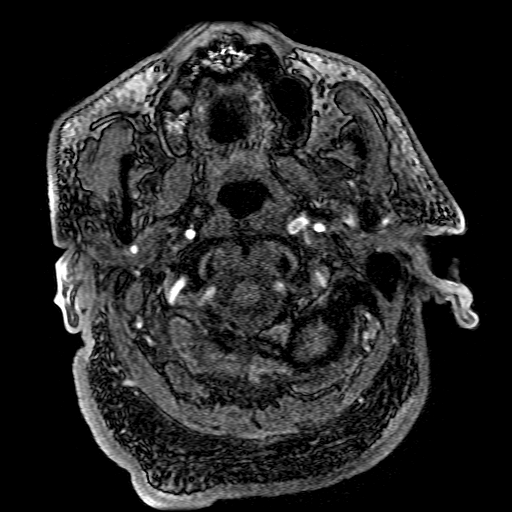
[im 42/154]
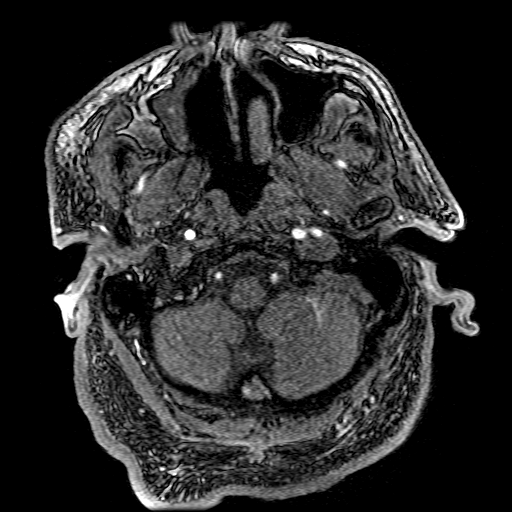
[im 70/154]
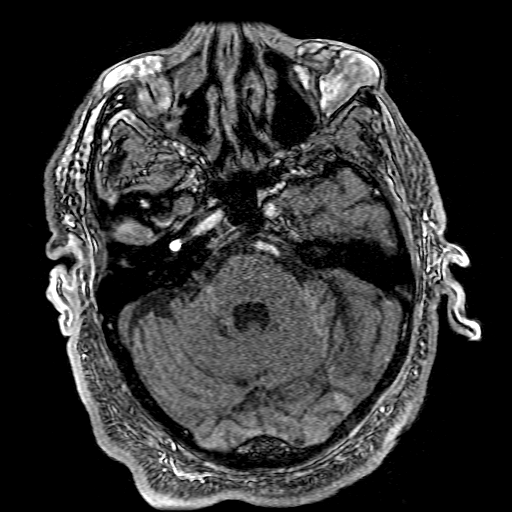

[Series 7: T2 · axial · 5.0mm · 0.43mm/px · z∈[-64,+80]mm · 2 of 25 slices shown (1 of 2)]
[im 1/25]
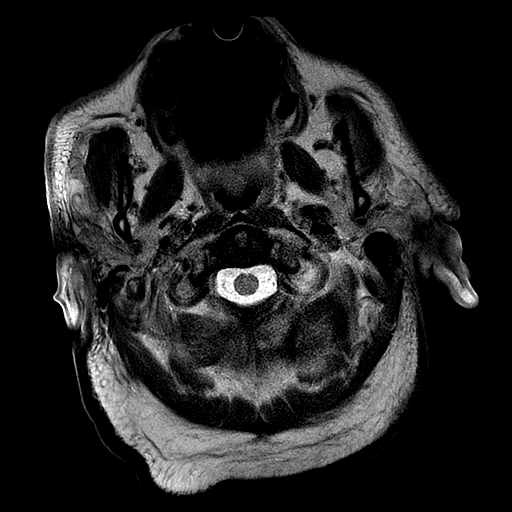
[im 25/25]
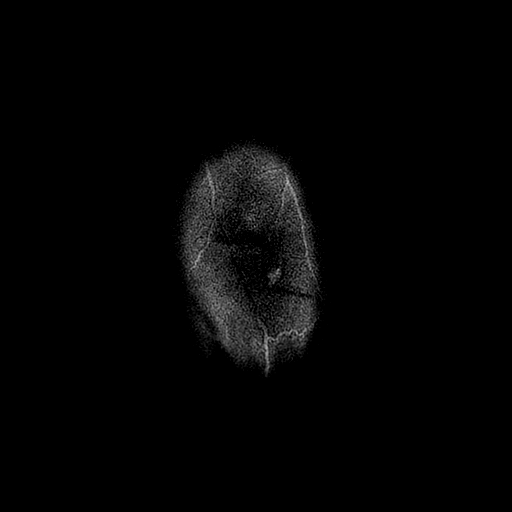

[Series 8: FLAIR · axial · 5.0mm · 0.43mm/px · z∈[-64,+80]mm · 2 of 25 slices shown]
[im 1/25]
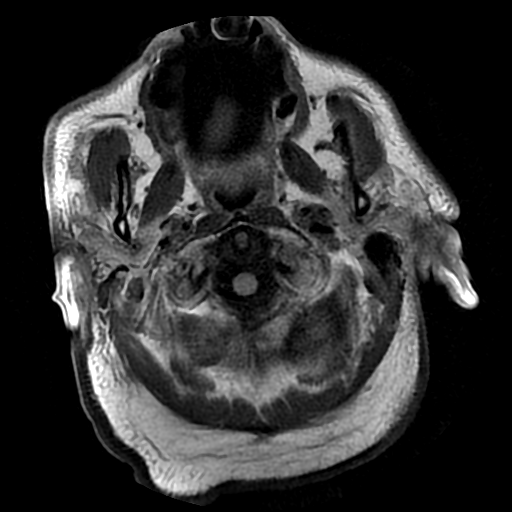
[im 25/25]
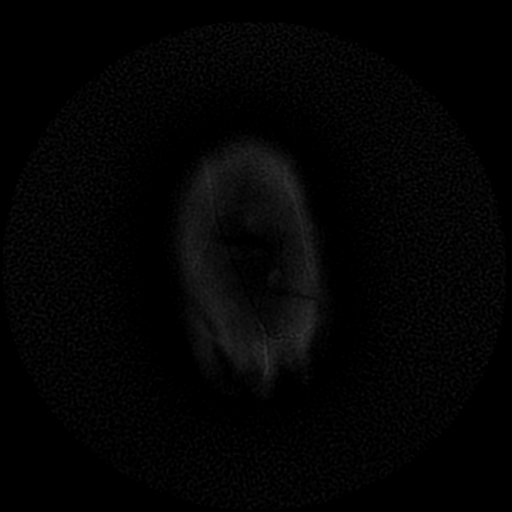

[Series 11: T2 · coronal · 5.0mm · 0.43mm/px · 2 of 31 slices shown (2 of 2)]
[im 1/31]
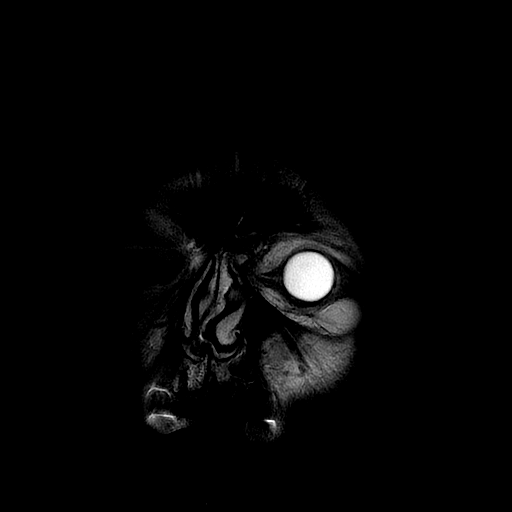
[im 31/31]
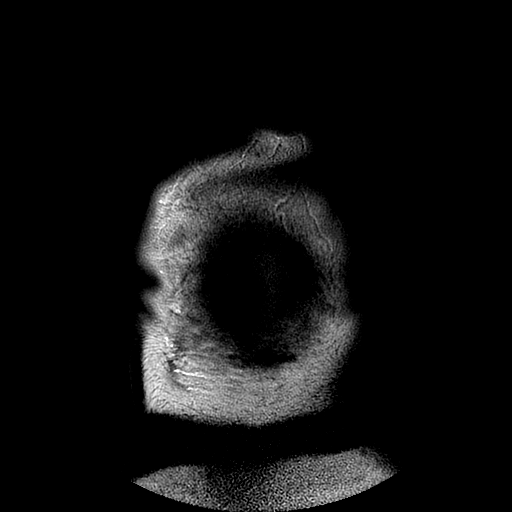

[Series 12: T1 · sagittal · 5.0mm · 0.47mm/px · 2 of 24 slices shown]
[im 1/24]
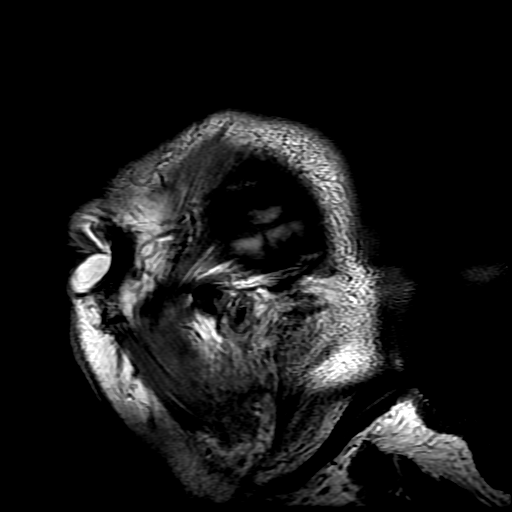
[im 24/24]
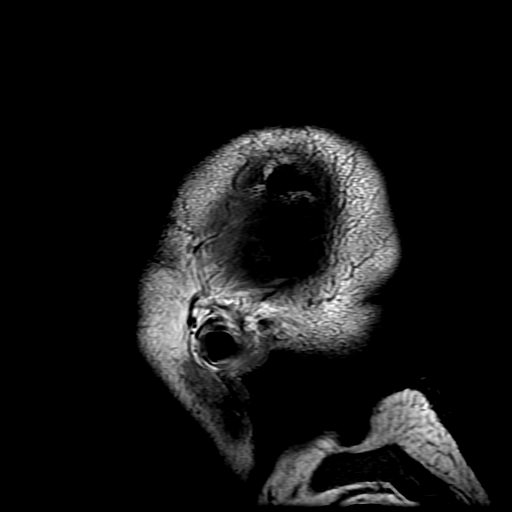

[Series 300: DWI · axial · 3.0mm · 1.09mm/px · z∈[-52,+71]mm · 3 of 42 slices shown (3 of 4)]
[im 1/42]
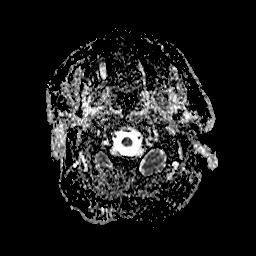
[im 21/42]
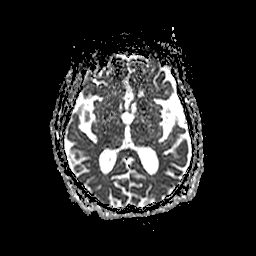
[im 42/42]
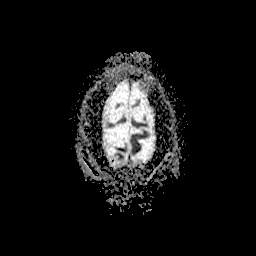

[Series 500: DWI · coronal · 5.0mm · 1.09mm/px · 3 of 33 slices shown (4 of 4)]
[im 1/33]
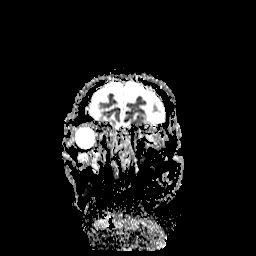
[im 17/33]
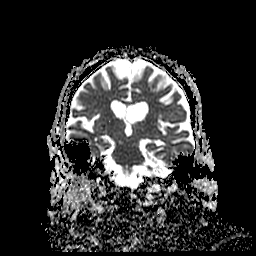
[im 33/33]
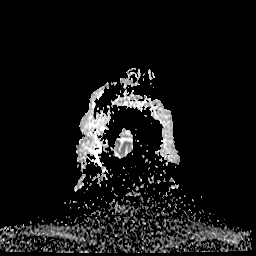

[30 of 48 positions shown; findings below may reference images not displayed]

FINDINGS: MRI HEAD FINDINGS

Diffuse prominence of the CSF containing spaces is compatible with
generalized age-related cerebral atrophy. No significant white
matter disease for patient age.

No abnormal foci of restricted diffusion to suggest acute
intracranial infarct. Gray-white matter differentiation maintained.
Normal intravascular flow voids are maintained. No acute or chronic
intracranial hemorrhage.

No mass lesion, midline shift, or mass effect. Mild ventricular
prominence related to global parenchymal volume loss without
hydrocephalus. No extra-axial fluid collection.

Craniocervical junction within normal limits.

Pituitary gland normal.

No acute abnormality about the orbits. Sequela of prior bilateral
lens extraction noted.

Right maxillary sinus is small and completely opacified, likely
chronic in nature. Mild scattered mucosal thickening within the
ethmoidal air cells. Trace opacity within the right mastoid air
cells.

Bone marrow signal intensity within normal limits. T2 hyperintense
lesion within the right parietal scalp noted, likely benign. No
scalp soft tissue abnormality.

MRA HEAD FINDINGS

ANTERIOR CIRCULATION:

Eighth visualized distal cervical segments of the internal carotid
arteries are patent with antegrade flow. Petrous, cavernous, and
supra clinoid segments well opacified. A1 segments widely patent.
Anterior communicating artery normal. Anterior cerebral arteries
opacified to their distal aspects.

M1 segments patent without focal stenosis or occlusion. MCA
bifurcations normal. Distal MCA branches fairly symmetric
bilaterally. Minimal distal small vessel atheromatous changes within
the MCA branches bilaterally.

POSTERIOR CIRCULATION:

Vertebral arteries are patent to the vertebrobasilar junction.
Posterior inferior cerebral arteries are patent proximally. Basilar
artery demonstrates mild multi focal atheromatous irregularity with
narrowing. No high-grade stenosis. Basilar artery is opacified to
its distal aspect. Superior cerebral arteries opacified bilaterally.
Both posterior cerebral arteries arise from the basilar artery and
are opacified to the distal aspects. Mild atheromatous irregularity
within the PCAs bilaterally.

No aneurysm. Apparent focal blush of high signal intensity will in
the region of the right middle cerebellar peduncle favored to be
artifact in nature with no definite underlying vascular malformation
or other acute abnormality.
IMPRESSION: MRI HEAD IMPRESSION:

1. No acute intracranial infarct or other process identified.
2. Generalized age-related cerebral atrophy.
3. Right maxillary sinus disease, likely chronic in nature.

MRA HEAD IMPRESSION:

1. No acute large or proximal arterial branch occlusion within the
intracranial circulation. No focal severe or correctable stenosis.
2. Mild atheromatous irregularity within the proximal basilar artery
without high-grade stenosis.
3. Mild distal small branch atheromatous irregularity within the MCA
and PCA branches bilaterally.
